# Patient Record
Sex: Female | Born: 1950 | Race: Asian | Hispanic: No | Smoking: Former smoker
Health system: Southern US, Community
[De-identification: ages and names within clinical notes are randomized; demographics above are authoritative.]

## PROBLEM LIST (undated history)

## (undated) DIAGNOSIS — R011 Cardiac murmur, unspecified: Secondary | ICD-10-CM

## (undated) DIAGNOSIS — Z8719 Personal history of other diseases of the digestive system: Secondary | ICD-10-CM

## (undated) DIAGNOSIS — K219 Gastro-esophageal reflux disease without esophagitis: Secondary | ICD-10-CM

## (undated) DIAGNOSIS — T7840XA Allergy, unspecified, initial encounter: Secondary | ICD-10-CM

## (undated) DIAGNOSIS — M1990 Unspecified osteoarthritis, unspecified site: Secondary | ICD-10-CM

## (undated) DIAGNOSIS — K279 Peptic ulcer, site unspecified, unspecified as acute or chronic, without hemorrhage or perforation: Secondary | ICD-10-CM

## (undated) DIAGNOSIS — M545 Low back pain: Secondary | ICD-10-CM

## (undated) DIAGNOSIS — H269 Unspecified cataract: Secondary | ICD-10-CM

## (undated) DIAGNOSIS — I10 Essential (primary) hypertension: Secondary | ICD-10-CM

## (undated) DIAGNOSIS — Z9221 Personal history of antineoplastic chemotherapy: Secondary | ICD-10-CM

## (undated) DIAGNOSIS — E785 Hyperlipidemia, unspecified: Secondary | ICD-10-CM

## (undated) DIAGNOSIS — C50919 Malignant neoplasm of unspecified site of unspecified female breast: Secondary | ICD-10-CM

## (undated) DIAGNOSIS — N951 Menopausal and female climacteric states: Secondary | ICD-10-CM

## (undated) DIAGNOSIS — K5730 Diverticulosis of large intestine without perforation or abscess without bleeding: Secondary | ICD-10-CM

## (undated) DIAGNOSIS — Z923 Personal history of irradiation: Secondary | ICD-10-CM

## (undated) DIAGNOSIS — Z973 Presence of spectacles and contact lenses: Secondary | ICD-10-CM

## (undated) HISTORY — DX: Essential (primary) hypertension: I10

## (undated) HISTORY — DX: Unspecified cataract: H26.9

## (undated) HISTORY — DX: Malignant neoplasm of unspecified site of unspecified female breast: C50.919

## (undated) HISTORY — DX: Low back pain: M54.5

## (undated) HISTORY — PX: CARPAL TUNNEL RELEASE: SHX101

## (undated) HISTORY — DX: Gastro-esophageal reflux disease without esophagitis: K21.9

## (undated) HISTORY — DX: Peptic ulcer, site unspecified, unspecified as acute or chronic, without hemorrhage or perforation: K27.9

## (undated) HISTORY — PX: BREAST LUMPECTOMY: SHX2

## (undated) HISTORY — PX: ABDOMINAL HYSTERECTOMY: SHX81

## (undated) HISTORY — PX: TUBAL LIGATION: SHX77

## (undated) HISTORY — PX: COLONOSCOPY: SHX174

## (undated) HISTORY — PX: BREAST SURGERY: SHX581

## (undated) HISTORY — PX: CHOLECYSTECTOMY: SHX55

## (undated) HISTORY — DX: Diverticulosis of large intestine without perforation or abscess without bleeding: K57.30

## (undated) HISTORY — DX: Hyperlipidemia, unspecified: E78.5

## (undated) HISTORY — DX: Menopausal and female climacteric states: N95.1

## (undated) HISTORY — DX: Allergy, unspecified, initial encounter: T78.40XA

## (undated) HISTORY — DX: Personal history of other diseases of the digestive system: Z87.19

---

## 1999-12-31 ENCOUNTER — Encounter: Payer: Self-pay | Admitting: Internal Medicine

## 1999-12-31 ENCOUNTER — Encounter: Admission: RE | Admit: 1999-12-31 | Discharge: 1999-12-31 | Payer: Self-pay | Admitting: Internal Medicine

## 2001-01-06 ENCOUNTER — Encounter: Payer: Self-pay | Admitting: Internal Medicine

## 2001-01-06 ENCOUNTER — Encounter: Admission: RE | Admit: 2001-01-06 | Discharge: 2001-01-06 | Payer: Self-pay | Admitting: Internal Medicine

## 2001-07-27 ENCOUNTER — Other Ambulatory Visit: Admission: RE | Admit: 2001-07-27 | Discharge: 2001-07-27 | Payer: Self-pay | Admitting: Obstetrics and Gynecology

## 2002-01-07 ENCOUNTER — Encounter: Payer: Self-pay | Admitting: Internal Medicine

## 2002-01-07 ENCOUNTER — Encounter: Admission: RE | Admit: 2002-01-07 | Discharge: 2002-01-07 | Payer: Self-pay | Admitting: Internal Medicine

## 2003-01-10 ENCOUNTER — Encounter: Payer: Self-pay | Admitting: Internal Medicine

## 2003-01-10 ENCOUNTER — Encounter: Admission: RE | Admit: 2003-01-10 | Discharge: 2003-01-10 | Payer: Self-pay | Admitting: Internal Medicine

## 2004-01-19 ENCOUNTER — Ambulatory Visit (HOSPITAL_COMMUNITY): Admission: RE | Admit: 2004-01-19 | Discharge: 2004-01-19 | Payer: Self-pay | Admitting: Internal Medicine

## 2004-01-30 ENCOUNTER — Encounter (INDEPENDENT_AMBULATORY_CARE_PROVIDER_SITE_OTHER): Payer: Self-pay | Admitting: Specialist

## 2004-01-30 ENCOUNTER — Encounter: Admission: RE | Admit: 2004-01-30 | Discharge: 2004-01-30 | Payer: Self-pay | Admitting: Internal Medicine

## 2004-02-15 LAB — HM COLONOSCOPY

## 2005-02-06 ENCOUNTER — Ambulatory Visit: Payer: Self-pay | Admitting: Internal Medicine

## 2005-02-08 ENCOUNTER — Encounter: Admission: RE | Admit: 2005-02-08 | Discharge: 2005-02-08 | Payer: Self-pay | Admitting: Internal Medicine

## 2005-02-13 ENCOUNTER — Ambulatory Visit: Payer: Self-pay | Admitting: Internal Medicine

## 2005-08-29 ENCOUNTER — Ambulatory Visit: Payer: Self-pay | Admitting: Internal Medicine

## 2005-12-11 ENCOUNTER — Ambulatory Visit: Payer: Self-pay | Admitting: Internal Medicine

## 2006-02-13 ENCOUNTER — Encounter: Admission: RE | Admit: 2006-02-13 | Discharge: 2006-02-13 | Payer: Self-pay | Admitting: Internal Medicine

## 2006-02-13 ENCOUNTER — Encounter: Payer: Self-pay | Admitting: Internal Medicine

## 2006-02-26 ENCOUNTER — Ambulatory Visit: Payer: Self-pay | Admitting: Internal Medicine

## 2006-06-02 ENCOUNTER — Ambulatory Visit: Payer: Self-pay | Admitting: Internal Medicine

## 2006-12-15 ENCOUNTER — Ambulatory Visit: Payer: Self-pay | Admitting: Internal Medicine

## 2007-01-27 ENCOUNTER — Ambulatory Visit: Payer: Self-pay | Admitting: Internal Medicine

## 2007-04-20 ENCOUNTER — Encounter: Payer: Self-pay | Admitting: Internal Medicine

## 2007-04-20 ENCOUNTER — Ambulatory Visit: Payer: Self-pay | Admitting: Internal Medicine

## 2007-04-20 DIAGNOSIS — E785 Hyperlipidemia, unspecified: Secondary | ICD-10-CM

## 2007-04-20 DIAGNOSIS — N951 Menopausal and female climacteric states: Secondary | ICD-10-CM | POA: Insufficient documentation

## 2007-04-20 DIAGNOSIS — I10 Essential (primary) hypertension: Secondary | ICD-10-CM

## 2007-04-20 DIAGNOSIS — K219 Gastro-esophageal reflux disease without esophagitis: Secondary | ICD-10-CM | POA: Insufficient documentation

## 2007-04-20 DIAGNOSIS — K5730 Diverticulosis of large intestine without perforation or abscess without bleeding: Secondary | ICD-10-CM

## 2007-04-20 HISTORY — DX: Menopausal and female climacteric states: N95.1

## 2007-04-20 HISTORY — DX: Gastro-esophageal reflux disease without esophagitis: K21.9

## 2007-04-20 HISTORY — DX: Diverticulosis of large intestine without perforation or abscess without bleeding: K57.30

## 2007-04-20 HISTORY — DX: Essential (primary) hypertension: I10

## 2007-04-20 HISTORY — DX: Hyperlipidemia, unspecified: E78.5

## 2007-05-26 ENCOUNTER — Ambulatory Visit: Payer: Self-pay | Admitting: Internal Medicine

## 2007-05-26 LAB — CONVERTED CEMR LAB
Basophils Absolute: 0 10*3/uL (ref 0.0–0.1)
CO2: 32 meq/L (ref 19–32)
Cholesterol: 208 mg/dL (ref 0–200)
Creatinine, Ser: 1 mg/dL (ref 0.4–1.2)
Direct LDL: 119.3 mg/dL
GFR calc Af Amer: 74 mL/min
HCT: 40.8 % (ref 36.0–46.0)
Hemoglobin: 14.3 g/dL (ref 12.0–15.0)
Lymphocytes Relative: 26.1 % (ref 12.0–46.0)
MCHC: 35.2 g/dL (ref 30.0–36.0)
Monocytes Absolute: 0.5 10*3/uL (ref 0.2–0.7)
Neutro Abs: 5 10*3/uL (ref 1.4–7.7)
Neutrophils Relative %: 63.9 % (ref 43.0–77.0)
Potassium: 4.2 meq/L (ref 3.5–5.1)
RDW: 11.8 % (ref 11.5–14.6)
Sodium: 143 meq/L (ref 135–145)
TSH: 1.98 microintl units/mL (ref 0.35–5.50)
Total Bilirubin: 0.7 mg/dL (ref 0.3–1.2)
Total CHOL/HDL Ratio: 3.6
Total Protein: 6.8 g/dL (ref 6.0–8.3)

## 2007-07-15 ENCOUNTER — Ambulatory Visit: Payer: Self-pay | Admitting: Internal Medicine

## 2007-08-21 ENCOUNTER — Ambulatory Visit: Payer: Self-pay | Admitting: Internal Medicine

## 2007-09-07 ENCOUNTER — Encounter: Payer: Self-pay | Admitting: Internal Medicine

## 2008-01-19 ENCOUNTER — Ambulatory Visit: Payer: Self-pay | Admitting: Internal Medicine

## 2008-02-08 ENCOUNTER — Ambulatory Visit: Payer: Self-pay | Admitting: Internal Medicine

## 2008-02-08 DIAGNOSIS — J069 Acute upper respiratory infection, unspecified: Secondary | ICD-10-CM | POA: Insufficient documentation

## 2008-05-09 ENCOUNTER — Telehealth: Payer: Self-pay | Admitting: Internal Medicine

## 2008-05-10 ENCOUNTER — Ambulatory Visit: Payer: Self-pay | Admitting: Internal Medicine

## 2008-05-10 DIAGNOSIS — R51 Headache: Secondary | ICD-10-CM | POA: Insufficient documentation

## 2008-05-10 DIAGNOSIS — R519 Headache, unspecified: Secondary | ICD-10-CM | POA: Insufficient documentation

## 2008-05-24 ENCOUNTER — Ambulatory Visit: Payer: Self-pay | Admitting: Internal Medicine

## 2008-05-24 DIAGNOSIS — S335XXA Sprain of ligaments of lumbar spine, initial encounter: Secondary | ICD-10-CM | POA: Insufficient documentation

## 2008-06-15 ENCOUNTER — Telehealth: Payer: Self-pay | Admitting: Internal Medicine

## 2008-06-16 ENCOUNTER — Encounter: Payer: Self-pay | Admitting: Internal Medicine

## 2008-06-29 ENCOUNTER — Ambulatory Visit: Payer: Self-pay | Admitting: Internal Medicine

## 2008-06-29 LAB — CONVERTED CEMR LAB
AST: 29 units/L (ref 0–37)
Albumin: 3.6 g/dL (ref 3.5–5.2)
BUN: 13 mg/dL (ref 6–23)
Basophils Relative: 1 % (ref 0.0–3.0)
Chloride: 109 meq/L (ref 96–112)
Creatinine, Ser: 0.8 mg/dL (ref 0.4–1.2)
Direct LDL: 102.4 mg/dL
Eosinophils Absolute: 0.2 10*3/uL (ref 0.0–0.7)
Eosinophils Relative: 3.2 % (ref 0.0–5.0)
GFR calc non Af Amer: 79 mL/min
HCT: 41.1 % (ref 36.0–46.0)
Hemoglobin: 14.6 g/dL (ref 12.0–15.0)
MCV: 88.6 fL (ref 78.0–100.0)
Monocytes Absolute: 0.4 10*3/uL (ref 0.1–1.0)
Neutrophils Relative %: 57 % (ref 43.0–77.0)
Nitrite: NEGATIVE
RBC: 4.64 M/uL (ref 3.87–5.11)
Specific Gravity, Urine: 1.01
WBC Urine, dipstick: NEGATIVE
WBC: 6.1 10*3/uL (ref 4.5–10.5)

## 2008-08-16 ENCOUNTER — Ambulatory Visit: Payer: Self-pay | Admitting: Internal Medicine

## 2008-08-16 DIAGNOSIS — M545 Low back pain, unspecified: Secondary | ICD-10-CM

## 2008-08-16 HISTORY — DX: Low back pain, unspecified: M54.50

## 2008-12-19 ENCOUNTER — Ambulatory Visit: Payer: Self-pay | Admitting: Internal Medicine

## 2008-12-19 DIAGNOSIS — M6740 Ganglion, unspecified site: Secondary | ICD-10-CM | POA: Insufficient documentation

## 2009-01-02 ENCOUNTER — Encounter: Admission: RE | Admit: 2009-01-02 | Discharge: 2009-01-02 | Payer: Self-pay | Admitting: Internal Medicine

## 2009-03-07 ENCOUNTER — Ambulatory Visit: Payer: Self-pay | Admitting: Internal Medicine

## 2009-07-18 ENCOUNTER — Ambulatory Visit: Payer: Self-pay | Admitting: Internal Medicine

## 2009-07-24 ENCOUNTER — Ambulatory Visit: Payer: Self-pay | Admitting: Internal Medicine

## 2009-08-25 ENCOUNTER — Ambulatory Visit: Payer: Self-pay | Admitting: Internal Medicine

## 2009-08-25 LAB — CONVERTED CEMR LAB
ALT: 45 units/L — ABNORMAL HIGH (ref 0–35)
Albumin: 3.8 g/dL (ref 3.5–5.2)
Alkaline Phosphatase: 80 units/L (ref 39–117)
Basophils Relative: 0.4 % (ref 0.0–3.0)
Bilirubin Urine: NEGATIVE
CO2: 33 meq/L — ABNORMAL HIGH (ref 19–32)
Chloride: 102 meq/L (ref 96–112)
Cholesterol: 185 mg/dL (ref 0–200)
Direct LDL: 107.5 mg/dL
Eosinophils Absolute: 0.2 10*3/uL (ref 0.0–0.7)
Eosinophils Relative: 3.2 % (ref 0.0–5.0)
Hemoglobin: 14.5 g/dL (ref 12.0–15.0)
MCHC: 33.8 g/dL (ref 30.0–36.0)
MCV: 90.6 fL (ref 78.0–100.0)
Monocytes Absolute: 0.5 10*3/uL (ref 0.1–1.0)
Neutro Abs: 3.5 10*3/uL (ref 1.4–7.7)
Nitrite: NEGATIVE
Potassium: 3.9 meq/L (ref 3.5–5.1)
RBC: 4.72 M/uL (ref 3.87–5.11)
Sodium: 146 meq/L — ABNORMAL HIGH (ref 135–145)
Total CHOL/HDL Ratio: 4
Total Protein: 7.2 g/dL (ref 6.0–8.3)
Urobilinogen, UA: 0.2
WBC Urine, dipstick: NEGATIVE

## 2009-09-04 ENCOUNTER — Ambulatory Visit: Payer: Self-pay | Admitting: Internal Medicine

## 2009-12-21 ENCOUNTER — Ambulatory Visit: Payer: Self-pay | Admitting: Internal Medicine

## 2009-12-21 DIAGNOSIS — R109 Unspecified abdominal pain: Secondary | ICD-10-CM | POA: Insufficient documentation

## 2009-12-21 DIAGNOSIS — R350 Frequency of micturition: Secondary | ICD-10-CM | POA: Insufficient documentation

## 2009-12-21 DIAGNOSIS — Z8719 Personal history of other diseases of the digestive system: Secondary | ICD-10-CM

## 2009-12-21 HISTORY — DX: Personal history of other diseases of the digestive system: Z87.19

## 2009-12-21 LAB — CONVERTED CEMR LAB
Glucose, Urine, Semiquant: NEGATIVE
Nitrite: NEGATIVE
Protein, U semiquant: NEGATIVE
WBC Urine, dipstick: NEGATIVE

## 2009-12-22 ENCOUNTER — Telehealth: Payer: Self-pay | Admitting: Internal Medicine

## 2009-12-26 ENCOUNTER — Encounter: Admission: RE | Admit: 2009-12-26 | Discharge: 2009-12-26 | Payer: Self-pay | Admitting: Internal Medicine

## 2009-12-27 ENCOUNTER — Telehealth: Payer: Self-pay | Admitting: Internal Medicine

## 2009-12-27 LAB — CONVERTED CEMR LAB
AST: 38 units/L — ABNORMAL HIGH (ref 0–37)
Albumin: 3.6 g/dL (ref 3.5–5.2)
Total Protein: 6.8 g/dL (ref 6.0–8.3)

## 2010-01-03 ENCOUNTER — Encounter: Admission: RE | Admit: 2010-01-03 | Discharge: 2010-01-03 | Payer: Self-pay | Admitting: Internal Medicine

## 2010-03-05 ENCOUNTER — Ambulatory Visit: Payer: Self-pay | Admitting: Internal Medicine

## 2010-04-02 ENCOUNTER — Telehealth: Payer: Self-pay | Admitting: Internal Medicine

## 2010-07-30 ENCOUNTER — Telehealth: Payer: Self-pay | Admitting: Internal Medicine

## 2010-08-30 ENCOUNTER — Ambulatory Visit: Payer: Self-pay | Admitting: Internal Medicine

## 2010-08-30 LAB — CONVERTED CEMR LAB
ALT: 25 units/L (ref 0–35)
AST: 29 units/L (ref 0–37)
Albumin: 3.6 g/dL (ref 3.5–5.2)
BUN: 18 mg/dL (ref 6–23)
Basophils Absolute: 0 10*3/uL (ref 0.0–0.1)
Blood in Urine, dipstick: NEGATIVE
CO2: 29 meq/L (ref 19–32)
Chloride: 104 meq/L (ref 96–112)
Direct LDL: 101 mg/dL
Glucose, Bld: 88 mg/dL (ref 70–99)
Glucose, Urine, Semiquant: NEGATIVE
HCT: 42 % (ref 36.0–46.0)
HDL: 50.6 mg/dL (ref >39.00)
Hemoglobin: 14.3 g/dL (ref 12.0–15.0)
Lymphs Abs: 2.5 10*3/uL (ref 0.7–4.0)
MCV: 90 fL (ref 78.0–100.0)
Monocytes Absolute: 0.4 10*3/uL (ref 0.1–1.0)
Monocytes Relative: 5.9 % (ref 3.0–12.0)
Neutro Abs: 4.3 10*3/uL (ref 1.4–7.7)
Nitrite: NEGATIVE
Platelets: 217 10*3/uL (ref 150.0–400.0)
Potassium: 4 meq/L (ref 3.5–5.1)
RDW: 12.3 % (ref 11.5–14.6)
Sodium: 142 meq/L (ref 135–145)
Specific Gravity, Urine: 1.015
TSH: 1.71 microintl units/mL (ref 0.35–5.50)
Total Bilirubin: 0.8 mg/dL (ref 0.3–1.2)
WBC Urine, dipstick: NEGATIVE
pH: 6.5

## 2010-09-06 ENCOUNTER — Ambulatory Visit: Payer: Self-pay | Admitting: Internal Medicine

## 2010-09-06 DIAGNOSIS — K279 Peptic ulcer, site unspecified, unspecified as acute or chronic, without hemorrhage or perforation: Secondary | ICD-10-CM

## 2010-09-06 HISTORY — DX: Peptic ulcer, site unspecified, unspecified as acute or chronic, without hemorrhage or perforation: K27.9

## 2010-09-10 ENCOUNTER — Telehealth: Payer: Self-pay | Admitting: Internal Medicine

## 2010-11-22 NOTE — Progress Notes (Signed)
Summary: refill alprazolam  Phone Note Refill Request Message from:  Fax from Pharmacy on July 30, 2010 4:44 PM  Refills Requested: Medication #1:  ALPRAZOLAM 0.25 MG TABS Take 1 tablet by mouth once a day [facility]   Method Requested: Fax to Local Pharmacy Initial call taken by: Duard Brady LPN,  July 30, 2010 4:45 PM    Prescriptions: ALPRAZOLAM 0.25 MG TABS (ALPRAZOLAM) Take 1 tablet by mouth once a day  #90 x 2   Entered by:   Duard Brady LPN   Authorized by:   Gordy Savers  MD   Signed by:   Duard Brady LPN on 04/54/0981   Method used:   Historical   RxID:   [id number]  [facility]

## 2010-11-22 NOTE — Progress Notes (Signed)
Summary: dysuria  Phone Note Call from Patient   Caller: Patient Call For: Katie Savers  MD Summary of Call: Pt. is still having dysuria, frequency and strong odor to her Urine.  Is drinking all the liquids she can tolerate. [phone] Initial call taken by: Lynann Beaver CMA,  December 27, 2009 9:25 AM  Follow-up for Phone Call        generic cipro 500 #14 one BID Follow-up by: Katie Savers  MD,  December 27, 2009 12:30 PM  Additional Follow-up for Phone Call Additional follow up Details #1::        [facility]  Med called in and ptient aware. Additional Follow-up by: Rudy Jew, RN,  December 27, 2009 1:20 PM    New/Updated Medications: CIPROFLOXACIN HCL 500 MG TABS (CIPROFLOXACIN HCL) One bid Prescriptions: CIPROFLOXACIN HCL 500 MG TABS (CIPROFLOXACIN HCL) One bid  #14 x 0   Entered by:   Rudy Jew, RN   Authorized by:   Katie Savers  MD   Signed by:   Rudy Jew, RN on 12/27/2009   Method used:   Telephoned to ...         RxID:   [id number]

## 2010-11-22 NOTE — Assessment & Plan Note (Signed)
Summary: ? bladder inf/pt req ua/cjr   Vital Signs:  Patient profile:   60 year old female Weight:      173 pounds Temp:     97.3 degrees F oral BP sitting:   120 / 80  (left arm) Cuff size:   regular  Vitals Entered By: Duard Brady LPN (December 21, 1608 8:20 AM) CC: c/o ?UTI - low abd pain ,freq urination Is Patient Diabetic? No   CC:  c/o ?UTI - low abd pain  and freq urination.  History of Present Illness: 28 -year-old patient who is seen today for concerns about a possible bladder infection.  She has noticed an increase in frequency, occasional discomfort and achiness in the lower abdominal area.  No fever or frank dysuria.  Denies any fever or flank pain the patient has dyslipidemia, on Lipitor.  Transaminases were slightly elevated last visit.  She is a nondrinker. She has treated hypertension.  Preventive Screening-Counseling & Management  Alcohol-Tobacco     Smoking Status: never  Allergies: 1)  Sulfamethoxazole (Sulfamethoxazole) 2)  Amoxicillin (Amoxicillin)  Past History:  Past Medical History: Reviewed history from 08/16/2008 and no changes required. GERD Hyperlipidemia Menopausal syndrome Hypertension Diverticulosis, colon Low back pain  Family History: Reviewed history from 08/16/2008 and no changes required. father 46 of pneumonia.  History melanoma mother history of hypertension, hypercholesterolemia, and brother.   One sister positive for hypertension maternal grandmother had colon cancer one half brother with hypertension  Review of Systems  The patient denies anorexia, fever, weight loss, weight gain, vision loss, decreased hearing, hoarseness, chest pain, syncope, dyspnea on exertion, peripheral edema, prolonged cough, headaches, hemoptysis, abdominal pain, melena, hematochezia, severe indigestion/heartburn, hematuria, incontinence, genital sores, muscle weakness, suspicious skin lesions, transient blindness, difficulty walking,  depression, unusual weight change, abnormal bleeding, enlarged lymph nodes, angioedema, and breast masses.    Physical Exam  General:  slightly overweight.  Normal blood pressure Head:  Normocephalic and atraumatic without obvious abnormalities. No apparent alopecia or balding. Eyes:  No corneal or conjunctival inflammation noted. EOMI. Perrla. Funduscopic exam benign, without hemorrhages, exudates or papilledema. Vision grossly normal. Mouth:  Oral mucosa and oropharynx without lesions or exudates.  Teeth in good repair. Neck:  No deformities, masses, or tenderness noted. Lungs:  Normal respiratory effort, chest expands symmetrically. Lungs are clear to auscultation, no crackles or wheezes. Heart:  Normal rate and regular rhythm. S1 and S2 normal without gallop, murmur, click, rub or other extra sounds. Abdomen:  liver is palpable on inspiration Msk:  No deformity or scoliosis noted of thoracic or lumbar spine.   Pulses:  R and L carotid,radial,femoral,dorsalis pedis and posterior tibial pulses are full and equal bilaterally Extremities:  No clubbing, cyanosis, edema, or deformity noted with normal full range of motion of all joints.     Impression & Recommendations:  Problem # 1:  ABDOMINAL PAIN, LOWER (ICD-789.09)  Her updated medication list for this problem includes:    Darvocet-n 100 100-650 Mg Tabs (Propoxyphene n-apap) .Marland Kitchen... 1 qid as needed  Orders: UA Dipstick w/o Micro (manual) (96045)  Problem # 2:  HEPATOMEGALY, HX OF (ICD-V12.79)  Orders: Venipuncture (40981) TLB-Hepatic/Liver Function Pnl (80076-HEPATIC) Radiology Referral (Radiology)  Problem # 3:  HYPERTENSION (ICD-401.9)  Her updated medication list for this problem includes:    Hydrochlorothiazide 25 Mg Tabs (Hydrochlorothiazide) .Marland Kitchen... Take 1 tablet by mouth once a day  Complete Medication List: 1)  Alprazolam 0.25 Mg Tabs (Alprazolam) .... Take 1 tablet by mouth once a  day 2)  Hydrochlorothiazide 25 Mg Tabs  (Hydrochlorothiazide) .... Take 1 tablet by mouth once a day 3)  Lipitor 40 Mg Tabs (Atorvastatin calcium) .Marland Kitchen.. 1 once daily 4)  Nexium 40 Mg Cpdr (Esomeprazole magnesium) .... Take 1 capsule by mouth once a day 5)  Premarin 0.625 Mg Tabs (Estrogens conjugated) .... Take 1 tablet by mouth once a day 6)  Darvocet-n 100 100-650 Mg Tabs (Propoxyphene n-apap) .Marland Kitchen.. 1 qid as needed 7)  Allegra-d 12 Hour 60-120 Mg Xr12h-tab (Fexofenadine-pseudoephedrine) .Marland Kitchen.. 1 two times a day  Patient Instructions: 1)  Please schedule a follow-up appointment in 4 months. 2)  Limit your Sodium (Salt). 3)  It is important that you exercise regularly at least 20 minutes 5 times a week. If you develop chest pain, have severe difficulty breathing, or feel very tired , stop exercising immediately and seek medical attention.  Laboratory Results   Urine Tests  Date/Time Received: December 21, 2009 9:46 AM  Date/Time Reported: December 21, 2009 9:46 AM   Routine Urinalysis   Color: yellow Appearance: Clear Glucose: negative   (Normal Range: Negative) Bilirubin: negative   (Normal Range: Negative) Ketone: negative   (Normal Range: Negative) Spec. Gravity: 1.015   (Normal Range: 1.003-1.035) Blood: trace-lysed   (Normal Range: Negative) pH: 7.0   (Normal Range: 5.0-8.0) Protein: negative   (Normal Range: Negative) Urobilinogen: 0.2   (Normal Range: 0-1) Nitrite: negative   (Normal Range: Negative) Leukocyte Esterace: negative   (Normal Range: Negative)

## 2010-11-22 NOTE — Assessment & Plan Note (Signed)
Summary: cpx/no pap/njr   Vital Signs:  Patient profile:   60 year old female Height:      63 inches Weight:      166 pounds BMI:     29.51 Temp:     98.0 degrees F oral BP sitting:   110 / 72  (left arm) Cuff size:   regular  Vitals Entered By: Cay Schillings LPN (September 06, 3418 8:56 AM) CC: cpx - doing well Is Patient Diabetic? No   CC:  cpx - doing well.  History of Present Illness: 60 year old patient who is seen today for a wellness exam.  Medical problems include hypertension, gastroesophageal reflux disease, and a history of peptic ulcer disease and treated dyslipidemia, and has done quite well on Lipitor 40 mg daily.  She does receive annual gynecologic care.  She has chronic low back pain, which has been stable  Allergies: 1)  Sulfamethoxazole (Sulfamethoxazole) 2)  Amoxicillin (Amoxicillin)  Past History:  Past Medical History: GERD Hyperlipidemia Menopausal syndrome Hypertension Diverticulosis, colon Low back pain Peptic ulcer disease- early 16s  Past Surgical History: Reviewed history from 08/16/2008 and no changes required. Cholecystectomy Hysterectomy 1986 Tubal ligation 1979 gravida two, para two, abortus zero  colonoscopy April 2005  Family History: Reviewed history from 08/16/2008 and no changes required. father 69 of pneumonia.  History melanoma mother history of hypertension, hypercholesterolemia, and brother.   One sister positive for hypertension maternal grandmother had colon cancer one half brother with hypertension  Social History: Reviewed history from 08/16/2008 and no changes required. recent death of husband due to glioblastoma multiforme Never Smoked works 30 hours per week  Review of Systems  The patient denies anorexia, fever, weight loss, weight gain, vision loss, decreased hearing, hoarseness, chest pain, syncope, dyspnea on exertion, peripheral edema, prolonged cough, headaches, hemoptysis, abdominal pain, melena,  hematochezia, severe indigestion/heartburn, hematuria, incontinence, genital sores, muscle weakness, suspicious skin lesions, transient blindness, difficulty walking, depression, unusual weight change, abnormal bleeding, enlarged lymph nodes, angioedema, and breast masses.    Physical Exam  General:  Well-developed,well-nourished,in no acute distress; alert,appropriate and cooperative throughout examination Head:  Normocephalic and atraumatic without obvious abnormalities. No apparent alopecia or balding. Eyes:  No corneal or conjunctival inflammation noted. EOMI. Perrla. Funduscopic exam benign, without hemorrhages, exudates or papilledema. Vision grossly normal. Ears:  External ear exam shows no significant lesions or deformities.  Otoscopic examination reveals clear canals, tympanic membranes are intact bilaterally without bulging, retraction, inflammation or discharge. Hearing is grossly normal bilaterally. Nose:  External nasal examination shows no deformity or inflammation. Nasal mucosa are pink and moist without lesions or exudates. Mouth:  Oral mucosa and oropharynx without lesions or exudates.  Teeth in good repair. Neck:  No deformities, masses, or tenderness noted. Chest Wall:  No deformities, masses, or tenderness noted. Breasts:  No mass, nodules, thickening, tenderness, bulging, retraction, inflamation, nipple discharge or skin changes noted.   Lungs:  Normal respiratory effort, chest expands symmetrically. Lungs are clear to auscultation, no crackles or wheezes. Heart:  Normal rate and regular rhythm. S1 and S2 normal without gallop, murmur, click, rub or other extra sounds. Abdomen:  Bowel sounds positive,abdomen soft and non-tender without masses, organomegaly or hernias noted. Msk:  No deformity or scoliosis noted of thoracic or lumbar spine.   Pulses:  R and L carotid,radial,femoral,dorsalis pedis and posterior tibial pulses are full and equal bilaterally Extremities:  No  clubbing, cyanosis, edema, or deformity noted with normal full range of motion of all joints.  Neurologic:  No cranial nerve deficits noted. Station and gait are normal. Plantar reflexes are down-going bilaterally. DTRs are symmetrical throughout. Sensory, motor and coordinative functions appear intact. Skin:  Intact without suspicious lesions or rashes Cervical Nodes:  No lymphadenopathy noted Axillary Nodes:  No palpable lymphadenopathy Inguinal Nodes:  No significant adenopathy Psych:  Cognition and judgment appear intact. Alert and cooperative with normal attention span and concentration. No apparent delusions, illusions, hallucinations   Impression & Recommendations:  Problem # 1:  Preventive Health Care (ICD-V70.0)  Complete Medication List: 1)  Alprazolam 0.25 Mg Tabs (Alprazolam) .... Take 1 tablet by mouth once a day 2)  Hydrochlorothiazide 25 Mg Tabs (Hydrochlorothiazide) .... Take 1 tablet by mouth once a day 3)  Lipitor 40 Mg Tabs (Atorvastatin calcium) .Marland Kitchen.. 1 once daily 4)  Nexium 40 Mg Cpdr (Esomeprazole magnesium) .... Take 1 capsule by mouth once a day 5)  Premarin 0.625 Mg Tabs (Estrogens conjugated) .... Take 1 tablet by mouth once a day 6)  Allegra-d 12 Hour 60-120 Mg Xr12h-tab (Fexofenadine-pseudoephedrine) .Marland Kitchen.. 1 two times a day  Patient Instructions: 1)  Please schedule a follow-up appointment in 6 months. 2)  Avoid foods high in acid (tomatoes, citrus juices, spicy foods). Avoid eating within two hours of lying down or before exercising. Do not over eat; try smaller more frequent meals. Elevate head of bed twelve inches when sleeping. 3)  It is important that you exercise regularly at least 20 minutes 5 times a week. If you develop chest pain, have severe difficulty breathing, or feel very tired , stop exercising immediately and seek medical attention. 4)  Take calcium +Vitamin D daily. 5)  Check your Blood Pressure regularly. If it is above: 150/90 you should make  an appointment. Prescriptions: ALLEGRA-D 12 HOUR 60-120 MG XR12H-TAB (FEXOFENADINE-PSEUDOEPHEDRINE) 1 two times a day  #90 x 4   Entered and Authorized by:   Marletta Lor  MD   Signed by:   Marletta Lor  MD on 09/06/2010   Method used:   [facility]        [facility]  [address] [place] [facility]       [place], [place]  [address]       Ph: [phone] or [phone]       Fax: [phone]   RxID:   [id number] [place] 40 MG CPDR (ESOMEPRAZOLE MAGNESIUM) Take 1 capsule by mouth once a day  #90 x 4   Entered and Authorized by:   Marletta Lor  MD   Signed by:   Marletta Lor  MD on 09/06/2010   Method used:   [facility]ically to        [facility]  [address] [place] [facility]       [place], [place]  [address]       Ph: [phone] or [phone]       Fax: [phone]   RxID:   [id number] LIPITOR 40 MG TABS (ATORVASTATIN CALCIUM) 1 once daily  #90 x 4   Entered and Authorized by:   Marletta Lor  MD   Signed by:   Marletta Lor  MD on 09/06/2010   Method used:   Electronically to        [facility]  [address] [place] [facility]       [address]       [place], [place]  [address]  Ph: [phone] or [phone]       Fax: [phone]   RxID:   [id number] HYDROCHLOROTHIAZIDE 25 MG TABS (HYDROCHLOROTHIAZIDE) Take 1 tablet by mouth once a day  #90 x 4   Entered and Authorized by:   Marletta Lor  MD   Signed by:   Marletta Lor  MD on 09/06/2010   Method used:   [facility]ically to        [facility] [facility]       [address]       [place], [place]  [address]       Ph: [phone] or [phone]       Fax: [phone]   RxID:   [id number]    Orders Added: 1)  Est. Patient 40-64 years [[address]]   Immunization History:  Tetanus/Td Immunization History:    Tetanus/Td:  Td (02/08/2008)  Influenza Immunization History:    Influenza:   Historical (08/10/2010)   Immunization History:  Influenza Immunization History:    Influenza:  Historical (08/10/2010) done at work per pt. Orpha Bur

## 2010-11-22 NOTE — Progress Notes (Signed)
Summary: refill allegra  Phone Note Refill Request Message from:  Fax from Pharmacy on April 02, 2010 11:33 AM  Refills Requested: Medication #1:  ALLEGRA-D 12 HOUR 60-120 MG XR12H-TAB 1 two times a day. [facility]      Method Requested: Fax to Local Pharmacy Initial call taken by: Duard Brady LPN,  April 02, 2010 11:34 AM    Prescriptions: ALLEGRA-D 12 HOUR 60-120 MG XR12H-TAB (FEXOFENADINE-PSEUDOEPHEDRINE) 1 two times a day  #90 x 4   Entered by:   Duard Brady LPN   Authorized by:   Gordy Savers  MD   Signed by:   Duard Brady LPN on 04/54/0981   Method used:   Faxed to ...       [facility] [address] ([facility])       [address] [place], [place]  [address]       Ph: [phone]       Fax: [phone]   RxID:   [id number]

## 2010-11-22 NOTE — Progress Notes (Signed)
Summary: difficulty urination  Phone Note Call from Patient   Caller: Patient Call For: Gordy Savers  MD Summary of Call: Pt is calling to let Dr. Kirtland Bouchard know that she is having extreme difficulty passing Urine today.  Called pt back and got answering machine.  [phone] Initial call taken by: Lynann Beaver CMA,  December 22, 2009 1:52 PM  Follow-up for Phone Call        Pt calls back and states she is having more urge to urinate with no urine today.  Is still able to pass Urine. Follow-up by: Lynann Beaver CMA,  December 22, 2009 1:55 PM  Additional Follow-up for Phone Call Additional follow up Details #1::        force fluids; avoid caffeine call if worsens to consider an overactive bladder.  Medication Additional Follow-up by: Gordy Savers  MD,  December 22, 2009 2:43 PM    Additional Follow-up for Phone Call Additional follow up Details #2::    Pt. notified. Follow-up by: Lynann Beaver CMA,  December 22, 2009 2:52 PM

## 2010-11-22 NOTE — Progress Notes (Signed)
Summary: alternative needed  Phone Note From Pharmacy Call back at [phone]   [facility] [facility] Reason for Call: Patient requests substitution Summary of Call: allegra D 12h. not available from manufacturer. please substitute. Initial call taken by: Warnell Forester,  September 10, 2010 12:45 PM  Follow-up for Phone Call        sudafed and zyrtec Follow-up by: Gordy Savers  MD,  September 11, 2010 12:44 PM  Additional Follow-up for Phone Call Additional follow up Details #1::        called pharmacy - LMTCB of questions - can use sudfed and zyrtec otc -KIK Additional Follow-up by: Duard Brady LPN,  September 11, 2010 2:22 PM

## 2010-11-22 NOTE — Assessment & Plan Note (Signed)
Summary: 6 month rov/njr/PT RESCD//CCM   Vital Signs:  Patient profile:   60 year old female Weight:      169 pounds Temp:     98.2 degrees F oral BP sitting:   100 / 70  (left arm) Cuff size:   regular  Vitals Entered By: Duard Brady LPN (Mar 05, 2010 10:47 AM) CC: 6 mos rov - doing well Is Patient Diabetic? No   CC:  6 mos rov - doing well.  History of Present Illness: 60 year old patient who is seen today for follow-up of her dyslipidemia, gastroesophageal reflux disease, and mild hypertension.  She is done quite well.  No concerns or complaints.  She continues to tolerate her Lipitor without difficulty.  Nexium controls her dyspepsia quite well.  Preventive Screening-Counseling & Management  Alcohol-Tobacco     Smoking Status: quit  Allergies: 1)  Sulfamethoxazole (Sulfamethoxazole) 2)  Amoxicillin (Amoxicillin)  Past History:  Past Medical History: Reviewed history from 08/16/2008 and no changes required. GERD Hyperlipidemia Menopausal syndrome Hypertension Diverticulosis, colon Low back pain  Past Surgical History: Reviewed history from 08/16/2008 and no changes required. Cholecystectomy Hysterectomy 1986 Tubal ligation 1979 gravida two, para two, abortus zero  colonoscopy April 2005  Social History: Smoking Status:  quit  Review of Systems  The patient denies anorexia, fever, weight loss, weight gain, vision loss, decreased hearing, hoarseness, chest pain, syncope, dyspnea on exertion, peripheral edema, prolonged cough, headaches, hemoptysis, abdominal pain, melena, hematochezia, severe indigestion/heartburn, hematuria, incontinence, genital sores, muscle weakness, suspicious skin lesions, transient blindness, difficulty walking, depression, unusual weight change, abnormal bleeding, enlarged lymph nodes, angioedema, and breast masses.    Physical Exam  General:  110/70 Head:  Normocephalic and atraumatic without obvious abnormalities. No  apparent alopecia or balding. Eyes:  No corneal or conjunctival inflammation noted. EOMI. Perrla. Funduscopic exam benign, without hemorrhages, exudates or papilledema. Vision grossly normal. Mouth:  Oral mucosa and oropharynx without lesions or exudates.  Teeth in good repair. Neck:  No deformities, masses, or tenderness noted. Lungs:  Normal respiratory effort, chest expands symmetrically. Lungs are clear to auscultation, no crackles or wheezes. Heart:  Normal rate and regular rhythm. S1 and S2 normal without gallop, murmur, click, rub or other extra sounds. Abdomen:  Bowel sounds positive,abdomen soft and non-tender without masses, organomegaly or hernias noted.   Impression & Recommendations:  Problem # 1:  HYPERTENSION (ICD-401.9)  Her updated medication list for this problem includes:    Hydrochlorothiazide 25 Mg Tabs (Hydrochlorothiazide) .Marland Kitchen... Take 1 tablet by mouth once a day  Her updated medication list for this problem includes:    Hydrochlorothiazide 25 Mg Tabs (Hydrochlorothiazide) .Marland Kitchen... Take 1 tablet by mouth once a day  Problem # 2:  GERD (ICD-530.81)  Her updated medication list for this problem includes:    Nexium 40 Mg Cpdr (Esomeprazole magnesium) .Marland Kitchen... Take 1 capsule by mouth once a day  Her updated medication list for this problem includes:    Nexium 40 Mg Cpdr (Esomeprazole magnesium) .Marland Kitchen... Take 1 capsule by mouth once a day  Problem # 3:  HYPERLIPIDEMIA (ICD-272.4)  Her updated medication list for this problem includes:    Lipitor 40 Mg Tabs (Atorvastatin calcium) .Marland Kitchen... 1 once daily  Her updated medication list for this problem includes:    Lipitor 40 Mg Tabs (Atorvastatin calcium) .Marland Kitchen... 1 once daily  Complete Medication List: 1)  Alprazolam 0.25 Mg Tabs (Alprazolam) .... Take 1 tablet by mouth once a day 2)  Hydrochlorothiazide 25 Mg Tabs (Hydrochlorothiazide) .Marland KitchenMarland KitchenMarland Kitchen  Take 1 tablet by mouth once a day 3)  Lipitor 40 Mg Tabs (Atorvastatin calcium) .Marland Kitchen.. 1 once  daily 4)  Nexium 40 Mg Cpdr (Esomeprazole magnesium) .... Take 1 capsule by mouth once a day 5)  Premarin 0.625 Mg Tabs (Estrogens conjugated) .... Take 1 tablet by mouth once a day 6)  Allegra-d 12 Hour 60-120 Mg Xr12h-tab (Fexofenadine-pseudoephedrine) .Marland Kitchen.. 1 two times a day  Patient Instructions: 1)  Please schedule a follow-up appointment in 6 months. 2)  Limit your Sodium (Salt) to less than 2 grams a day(slightly less than 1/2 a teaspoon) to prevent fluid retention, swelling, or worsening of symptoms. 3)  It is important that you exercise regularly at least 20 minutes 5 times a week. If you develop chest pain, have severe difficulty breathing, or feel very tired , stop exercising immediately and seek medical attention. 4)  You need to lose weight. Consider a lower calorie diet and regular exercise.  5)  Check your Blood Pressure regularly. If it is above:  150/90 you should make an appointment. Prescriptions: ALLEGRA-D 12 HOUR 60-120 MG XR12H-TAB (FEXOFENADINE-PSEUDOEPHEDRINE) 1 two times a day  #90 x 4   Entered and Authorized by:   Gordy Savers  MD   Signed by:   Gordy Savers  MD on 03/05/2010   Method used:   Electronically to        [facility] [id number] (retail)       [address]       [place], [place]  [address]       Ph: [phone] or [phone]       Fax: [phone]   RxID:   [id number] PREMARIN 0.625 MG TABS (ESTROGENS CONJUGATED) Take 1 tablet by mouth once a day  #90 x 6   Entered and Authorized by:   Gordy Savers  MD   Signed by:   Gordy Savers  MD on 03/05/2010   Method used:   [facility]        [facility] (retail)       [address]       [place], [place]  [address]       Ph: [phone] or [phone]       Fax: [phone]   RxID:   [id number] NEXIUM 40 MG CPDR (ESOMEPRAZOLE MAGNESIUM) Take 1 capsule by mouth once a day  #90 x 6   Entered and Authorized by:   Gordy Savers   MD   Signed by:   Gordy Savers  MD on 03/05/2010   Method used:   [facility]        [facility] (retail)       [address]       [place], [place]  [address]       Ph: [phone] or [phone]       Fax: [phone]   RxID:   [id number] LIPITOR 40 MG TABS (ATORVASTATIN CALCIUM) 1 once daily  #90 x 6   Entered and Authorized by:   Gordy Savers  MD   Signed by:   Gordy Savers  MD on 03/05/2010   Method used:   [facility]        [facility] (retail)       [address]       [place], [place]  [address]       Ph: [phone] or [phone]  Fax: [phone]   RxID:   [id number] HYDROCHLOROTHIAZIDE 25 MG TABS (HYDROCHLOROTHIAZIDE) Take 1 tablet by mouth once a day  #90 x 6   Entered and Authorized by:   Gordy Savers  MD   Signed by:   Gordy Savers  MD on 03/05/2010   Method used:   [facility]        [facility] (retail)       [address]       [place], [place]  [address]       Ph: [phone] or [phone]       Fax: [phone]   RxID:   [id number]

## 2010-12-31 ENCOUNTER — Other Ambulatory Visit: Payer: Self-pay | Admitting: Internal Medicine

## 2010-12-31 DIAGNOSIS — Z1231 Encounter for screening mammogram for malignant neoplasm of breast: Secondary | ICD-10-CM

## 2011-01-23 ENCOUNTER — Ambulatory Visit
Admission: RE | Admit: 2011-01-23 | Discharge: 2011-01-23 | Disposition: A | Discharge status: Home / Self Care | Payer: BC Managed Care – PPO | Source: Ambulatory Visit | Attending: Internal Medicine | Admitting: Internal Medicine

## 2011-01-23 DIAGNOSIS — Z1231 Encounter for screening mammogram for malignant neoplasm of breast: Secondary | ICD-10-CM

## 2011-03-04 ENCOUNTER — Encounter: Payer: Self-pay | Admitting: Internal Medicine

## 2011-03-06 ENCOUNTER — Encounter: Payer: Self-pay | Admitting: Internal Medicine

## 2011-03-06 ENCOUNTER — Ambulatory Visit (INDEPENDENT_AMBULATORY_CARE_PROVIDER_SITE_OTHER): Payer: BC Managed Care – PPO | Admitting: Internal Medicine

## 2011-03-06 DIAGNOSIS — I10 Essential (primary) hypertension: Secondary | ICD-10-CM

## 2011-03-06 DIAGNOSIS — K219 Gastro-esophageal reflux disease without esophagitis: Secondary | ICD-10-CM

## 2011-03-06 DIAGNOSIS — E785 Hyperlipidemia, unspecified: Secondary | ICD-10-CM

## 2011-03-06 DIAGNOSIS — M545 Low back pain, unspecified: Secondary | ICD-10-CM

## 2011-03-06 NOTE — Patient Instructions (Signed)
Limit your sodium (Salt) intake    It is important that you exercise regularly, at least 20 minutes 3 to 4 times per week.  If you develop chest pain or shortness of breath seek  medical attention.  Please check your blood pressure on a regular basis.  If it is consistently greater than 150/90, please make an office appointment.  Back Exercises Back exercises help treat and prevent back injuries. The goal of back exercises is to increase the strength of your abdominal and back muscles and the flexibility of your back. These exercises should be started when you no longer have back pain. Back exercises include: 1. Pelvic Tilt - Lie on your back with your knees bent. Tilt your pelvis until the lower part of your back is against the floor. Hold this position 5-10 sec and repeat 5-10 times.  2. Knee to Chest - Pull first one knee up against your chest and hold for 20-30 seconds, repeat this with the other knee, and then both knees. This may be done with the other leg straight or bent, whichever feels better.  3. Sit-Ups or Curl-Ups - Bend your knees 90 degrees. Start with tilting your pelvis, and do a partial, slow sit-up, lifting your trunk only 30-45 degrees off the floor. Take at least 2-3 sec for each sit-up. Do not do sit-ups with your knees out straight. If partial sit-ups are difficult, simply do the above but with only tightening your abdominal muscles and holding it as directed.  4. Hip-Lift - Lie on your back with your knees flexed 90 degrees. Push down with your feet and shoulders as you raise your hips a couple inches off the floor; hold for 10 sec, repeat 5-10 times.  5. Back arches - Lie on your stomach, propping yourself up on bent elbows. Slowly press on your hands, causing an arch in your low back. Repeat 3-5 times. Any initial stiffness and discomfort should lessen with repetition over time.  6. Shoulder-Lifts - Lie face down with arms beside your body. Keep hips and torso pressed to floor  as you slowly lift your head and shoulders off the floor.  Do not overdo your exercises, especially in the beginning. Exercises may cause you some mild back discomfort which lasts for a few minutes; however, if the pain is more severe, or lasts for more than 15 minutes, do not continue exercises until you see your caregiver. Improvement with exercise therapy for back problems is slow.   See your caregivers for assistance with developing a proper back exercise program. Document Released: 11/14/2004 Document Re-Released: 01/03/2009 [facility] Patient Information 2011 [place], [place].Back Exercises Back exercises help treat and prevent back injuries. The goal of back exercises is to increase the strength of your abdominal and back muscles and the flexibility of your back. These exercises should be started when you no longer have back pain. Back exercises include: 1. Pelvic Tilt - Lie on your back with your knees bent. Tilt your pelvis until the lower part of your back is against the floor. Hold this position 5-10 sec and repeat 5-10 times.  2. Knee to Chest - Pull first one knee up against your chest and hold for 20-30 seconds, repeat this with the other knee, and then both knees. This may be done with the other leg straight or bent, whichever feels better.  3. Sit-Ups or Curl-Ups - Bend your knees 90 degrees. Start with tilting your pelvis, and do a partial, slow sit-up, lifting your trunk only  30-45 degrees off the floor. Take at least 2-3 sec for each sit-up. Do not do sit-ups with your knees out straight. If partial sit-ups are difficult, simply do the above but with only tightening your abdominal muscles and holding it as directed.  4. Hip-Lift - Lie on your back with your knees flexed 90 degrees. Push down with your feet and shoulders as you raise your hips a couple inches off the floor; hold for 10 sec, repeat 5-10 times.  5. Back arches - Lie on your stomach, propping yourself up on bent elbows. Slowly  press on your hands, causing an arch in your low back. Repeat 3-5 times. Any initial stiffness and discomfort should lessen with repetition over time.  6. Shoulder-Lifts - Lie face down with arms beside your body. Keep hips and torso pressed to floor as you slowly lift your head and shoulders off the floor.  Do not overdo your exercises, especially in the beginning. Exercises may cause you some mild back discomfort which lasts for a few minutes; however, if the pain is more severe, or lasts for more than 15 minutes, do not continue exercises until you see your caregiver. Improvement with exercise therapy for back problems is slow.   See your caregivers for assistance with developing a proper back exercise program. Document Released: 11/14/2004 Document Re-Released: 01/03/2009 [facility] Patient Information 2011 [place], [place].

## 2011-03-06 NOTE — Progress Notes (Signed)
  Subjective:    Patient ID: Katie Becker, female    DOB: August 11, 1951, 60 y.o.   MRN: [id number]  HPI  60 year old patient who is seen today for followup. She has a history of hypertension and dyslipidemia. She was seen here 6 months ago for an annual exam and laboratory studies were reviewed. She has done quite well does have some occasional back pain which has been fairly stable. No new concerns or complaints. She has some history mild exogenous obesity and unfortunately this is unchanged.    Review of Systems  Constitutional: Negative.   HENT: Negative for hearing loss, congestion, sore throat, rhinorrhea, dental problem, sinus pressure and tinnitus.   Eyes: Negative for pain, discharge and visual disturbance.  Respiratory: Negative for cough and shortness of breath.   Cardiovascular: Negative for chest pain, palpitations and leg swelling.  Gastrointestinal: Negative for nausea, vomiting, abdominal pain, diarrhea, constipation, blood in stool and abdominal distention.  Genitourinary: Negative for dysuria, urgency, frequency, hematuria, flank pain, vaginal bleeding, vaginal discharge, difficulty urinating, vaginal pain and pelvic pain.  Musculoskeletal: Positive for back pain. Negative for joint swelling, arthralgias and gait problem.  Skin: Negative for rash.  Neurological: Negative for dizziness, syncope, speech difficulty, weakness, numbness and headaches.  Hematological: Negative for adenopathy.  Psychiatric/Behavioral: Negative for behavioral problems, dysphoric mood and agitation. The patient is not nervous/anxious.        Objective:   Physical Exam  Constitutional: She is oriented to person, place, and time. She appears well-developed and well-nourished.  HENT:  Head: Normocephalic.  Right Ear: External ear normal.  Left Ear: External ear normal.  Mouth/Throat: Oropharynx is clear and moist.  Eyes: Conjunctivae and EOM are normal. Pupils are equal, round, and reactive to light.   Neck: Normal range of motion. Neck supple. No thyromegaly present.  Cardiovascular: Normal rate, regular rhythm, normal heart sounds and intact distal pulses.   Pulmonary/Chest: Effort normal and breath sounds normal.  Abdominal: Soft. Bowel sounds are normal. She exhibits no mass. There is no tenderness.  Musculoskeletal: Normal range of motion.  Lymphadenopathy:    She has no cervical adenopathy.  Neurological: She is alert and oriented to person, place, and time.  Skin: Skin is warm and dry. No rash noted.  Psychiatric: She has a normal mood and affect. Her behavior is normal.          Assessment & Plan:  Hypertension stable Dyslipidemia. Stable Low back pain. We'll continue symptomatic care. Weight loss exercise all encouraged  Return in 6 months for her annual exam

## 2011-03-08 NOTE — Assessment & Plan Note (Signed)
[facility]                              [facility] OFFICE NOTE   NAME:Katie Becker, Katie Becker                        MRN:          [id number]  DATE:06/02/2006                            DOB:          [birth year]    A 60 year old female seen today for wellness exam.  She has a history of  hypercholesterolemia, reflux, menopausal syndrome.  She is followed for her  gynecologic care.  She is a gravida 2,  para 2, abortus 0.  She has had a  cholecystectomy, a hysterectomy and a tubal ligation.  Medical regimen  reviewed.   REVIEW OF SYSTEMS:  Exam is unremarkable.   Colonoscopy was in the Spring of 2005.   SOCIAL HISTORY:  Husband is stable, approximately 1-1/2 years out from  initial diagnosis of glioblastoma multiforme.   FAMILY HISTORY:  Positive for melanoma,  hypertension, colon cancer and  breast cancer.   EXAM:  Revealed a healthy appearing female in no acute distress.  Blood  pressure was 130/82.  FUNDI/ENT:  Clear.  NECK:  No bruits or adenopathy, no thyroid enlargement.  CHEST:  Clear.  BREASTS:  Negative.  CARDIOVASCULAR EXAM:  Normal heart sounds, no murmurs.  ABDOMEN:  Benign.  EXTREMITIES:  Full peripheral pulses, no edema.  NEURO:  Negative.   IMPRESSION:  1. Hyperlipidemia.  2. Gastroesophageal reflux disease.  3. Menopausal syndrome.   DISPOSITION:  Medical regimen unchanged.  Laboratory studies were done  recently.  Will recheck in 1 year.                                   Marletta Lor, MD   PFK/MedQ  DD:  06/02/2006  DT:  06/02/2006  Job #:  [id number]

## 2011-03-26 ENCOUNTER — Other Ambulatory Visit: Payer: Self-pay

## 2011-03-26 MED ORDER — ESTROGENS CONJUGATED 0.625 MG PO TABS: 0.6250 mg | ORAL_TABLET | Freq: Every day | ORAL | Status: DC

## 2011-03-26 NOTE — Telephone Encounter (Signed)
Faxed back to [employer]

## 2011-09-03 ENCOUNTER — Other Ambulatory Visit (INDEPENDENT_AMBULATORY_CARE_PROVIDER_SITE_OTHER): Payer: BC Managed Care – PPO

## 2011-09-03 DIAGNOSIS — Z0000 Encounter for general adult medical examination without abnormal findings: Secondary | ICD-10-CM

## 2011-09-03 LAB — POCT URINALYSIS DIPSTICK
Bilirubin, UA: NEGATIVE
Blood, UA: NEGATIVE
Glucose, UA: NEGATIVE
Ketones, UA: NEGATIVE
pH, UA: 5.5

## 2011-09-03 LAB — CBC WITH DIFFERENTIAL/PLATELET
Basophils Relative: 0.5 % (ref 0.0–3.0)
Eosinophils Relative: 3.3 % (ref 0.0–5.0)
HCT: 42.6 % (ref 36.0–46.0)
Hemoglobin: 14.7 g/dL (ref 12.0–15.0)
Lymphs Abs: 2.3 10*3/uL (ref 0.7–4.0)
MCV: 88.4 fl (ref 78.0–100.0)
Monocytes Absolute: 0.3 10*3/uL (ref 0.1–1.0)
Neutro Abs: 4.1 10*3/uL (ref 1.4–7.7)
Platelets: 245 10*3/uL (ref 150.0–400.0)
RBC: 4.82 Mil/uL (ref 3.87–5.11)
WBC: 7 10*3/uL (ref 4.5–10.5)

## 2011-09-04 LAB — HEPATIC FUNCTION PANEL
ALT: 39 U/L — ABNORMAL HIGH (ref 0–35)
Albumin: 3.7 g/dL (ref 3.5–5.2)
Total Protein: 6.9 g/dL (ref 6.0–8.3)

## 2011-09-04 LAB — LIPID PANEL
Cholesterol: 178 mg/dL (ref 0–200)
Triglycerides: 214 mg/dL — ABNORMAL HIGH (ref 0.0–149.0)

## 2011-09-04 LAB — BASIC METABOLIC PANEL
BUN: 19 mg/dL (ref 6–23)
Chloride: 101 mEq/L (ref 96–112)
Potassium: 4 mEq/L (ref 3.5–5.1)
Sodium: 141 mEq/L (ref 135–145)

## 2011-09-04 LAB — LDL CHOLESTEROL, DIRECT: Direct LDL: 105.5 mg/dL

## 2011-09-10 ENCOUNTER — Encounter: Payer: Self-pay | Admitting: Internal Medicine

## 2011-09-10 ENCOUNTER — Ambulatory Visit (INDEPENDENT_AMBULATORY_CARE_PROVIDER_SITE_OTHER): Payer: BC Managed Care – PPO | Admitting: Internal Medicine

## 2011-09-10 DIAGNOSIS — E785 Hyperlipidemia, unspecified: Secondary | ICD-10-CM

## 2011-09-10 DIAGNOSIS — Z0000 Encounter for general adult medical examination without abnormal findings: Secondary | ICD-10-CM

## 2011-09-10 DIAGNOSIS — I10 Essential (primary) hypertension: Secondary | ICD-10-CM

## 2011-09-10 MED ORDER — ESTROGENS CONJUGATED 0.625 MG PO TABS: 0.6250 mg | ORAL_TABLET | Freq: Every day | ORAL | Status: DC

## 2011-09-10 MED ORDER — ESOMEPRAZOLE MAGNESIUM 40 MG PO CPDR: 40.0000 mg | DELAYED_RELEASE_CAPSULE | Freq: Every day | ORAL | Status: DC

## 2011-09-10 MED ORDER — ATORVASTATIN CALCIUM 40 MG PO TABS: 40.0000 mg | ORAL_TABLET | Freq: Every day | ORAL | Status: DC

## 2011-09-10 MED ORDER — HYDROCHLOROTHIAZIDE 25 MG PO TABS: 25.0000 mg | ORAL_TABLET | Freq: Every day | ORAL | Status: DC

## 2011-09-10 NOTE — Progress Notes (Signed)
Subjective:    Patient ID: Katie Becker, female    DOB: 09/13/1951, 60 y.o.   MRN: [id number]  HPI  60 year old patient who is seen today for a preventive health examination. She is doing quite well. Medical problems include hypertension and dyslipidemia. She has had a hysterectomy and is on hormone replacement therapy. She is scheduled for gynecologic followup soon. She is doing quite well.  Past Medical History  Diagnosis Date  . DIVERTICULOSIS, COLON 04/20/2007  . GERD 04/20/2007  . HEPATOMEGALY, HX OF 12/21/2009  . HYPERLIPIDEMIA 04/20/2007  . HYPERTENSION 04/20/2007  . LOW BACK PAIN 08/16/2008  . MENOPAUSAL SYNDROME 04/20/2007  . PEPTIC ULCER DISEASE 09/06/2010    History   Social History  . Marital Status: Married    Spouse Name: N/A    Number of Children: N/A  . Years of Education: N/A   Occupational History  . Not on file.   Social History Main Topics  . Smoking status: Former Smoker    Quit date: 10/21/1978  . Smokeless tobacco: Never Used  . Alcohol Use: No  . Drug Use: No  . Sexually Active: Not on file   Other Topics Concern  . Not on file   Social History Narrative  . No narrative on file    Past Surgical History  Procedure Date  . Cholecystectomy   . Abdominal hysterectomy   . Tubal ligation     Family History  Problem Relation Age of Onset  . Hypertension Mother   . Hyperlipidemia Mother   . Cancer Father     hx of melanoma  . Hypertension Sister   . Hypertension Brother   . Cancer Maternal Grandmother     colon    Allergies  Allergen Reactions  . Amoxicillin     REACTION: unspecified  . Sulfamethoxazole     REACTION: unspecified    Current Outpatient Prescriptions on File Prior to Visit  Medication Sig Dispense Refill  . atorvastatin (LIPITOR) 40 MG tablet Take 40 mg by mouth daily.        Marland Kitchen esomeprazole (NEXIUM) 40 MG capsule Take 40 mg by mouth daily before breakfast.        . estrogens, conjugated, (PREMARIN) 0.625 MG tablet Take  1 tablet (0.625 mg total) by mouth daily. Take daily for 21 days then do not take for 7 days.  90 tablet  3  . fexofenadine-pseudoephedrine (ALLEGRA-D) 60-120 MG per tablet Take 1 tablet by mouth 2 (two) times daily.        . hydrochlorothiazide 25 MG tablet Take 25 mg by mouth daily.          BP 120/78  Pulse 70  Temp(Src) 97.8 F (36.6 C) (Oral)  Resp 16  Ht 5' 2.5" (1.588 m)  Wt 174 lb (78.926 kg)  BMI 31.32 kg/m2  SpO2 97%        Review of Systems  Constitutional: Negative for fever, appetite change, fatigue and unexpected weight change.  HENT: Negative for hearing loss, ear pain, nosebleeds, congestion, sore throat, mouth sores, trouble swallowing, neck stiffness, dental problem, voice change, sinus pressure and tinnitus.   Eyes: Negative for photophobia, pain, redness and visual disturbance.  Respiratory: Negative for cough, chest tightness and shortness of breath.   Cardiovascular: Negative for chest pain, palpitations and leg swelling.  Gastrointestinal: Negative for nausea, vomiting, abdominal pain, diarrhea, constipation, blood in stool, abdominal distention and rectal pain.  Genitourinary: Negative for dysuria, urgency, frequency, hematuria, flank pain, vaginal  bleeding, vaginal discharge, difficulty urinating, genital sores, vaginal pain, menstrual problem and pelvic pain.  Musculoskeletal: Negative for back pain and arthralgias.  Skin: Negative for rash.  Neurological: Negative for dizziness, syncope, speech difficulty, weakness, light-headedness, numbness and headaches.  Hematological: Negative for adenopathy. Does not bruise/bleed easily.  Psychiatric/Behavioral: Negative for suicidal ideas, behavioral problems, self-injury, dysphoric mood and agitation. The patient is not nervous/anxious.        Objective:   Physical Exam  Constitutional: She is oriented to person, place, and time. She appears well-developed and well-nourished.  HENT:  Head: Normocephalic and  atraumatic.  Right Ear: External ear normal.  Left Ear: External ear normal.  Mouth/Throat: Oropharynx is clear and moist.  Eyes: Conjunctivae and EOM are normal.  Neck: Normal range of motion. Neck supple. No JVD present. No thyromegaly present.  Cardiovascular: Normal rate, regular rhythm, normal heart sounds and intact distal pulses.   No murmur heard. Pulmonary/Chest: Effort normal and breath sounds normal. She has no wheezes. She has no rales.  Abdominal: Soft. Bowel sounds are normal. She exhibits no distension and no mass. There is no tenderness. There is no rebound and no guarding.  Musculoskeletal: Normal range of motion. She exhibits no edema and no tenderness.  Neurological: She is alert and oriented to person, place, and time. She has normal reflexes. No cranial nerve deficit. She exhibits normal muscle tone. Coordination normal.  Skin: Skin is warm and dry. No rash noted.  Psychiatric: She has a normal mood and affect. Her behavior is normal.          Assessment & Plan:    Preventive health examination Hypertension well controlled Hypercholesterolemia stable we'll check a lipid profile Gastroesophageal reflux disease stable  Weight loss exercise and her reflux regimen I'll encouraged Recheck in one year or when necessary

## 2011-09-10 NOTE — Patient Instructions (Addendum)
You need to lose weight.  Consider a lower calorie diet and regular exercise.  Please check your blood pressure on a regular basis.  If it is consistently greater than 150/90, please make an office appointment.  Limit your sodium (Salt) intake  Avoids foods high in acid such as tomatoes citrus juices, and spicy foods.  Avoid eating within two hours of lying down or before exercising.  Do not overheat.  Try smaller more frequent meals.  If symptoms persist, elevate the head of her bed 12 inches while sleeping.  Gynecology followup  Take a calcium supplement, plus [phone] units of vitamin D

## 2011-10-01 ENCOUNTER — Other Ambulatory Visit: Payer: Self-pay | Admitting: Internal Medicine

## 2011-12-24 ENCOUNTER — Other Ambulatory Visit: Payer: Self-pay | Admitting: Internal Medicine

## 2011-12-24 DIAGNOSIS — Z1231 Encounter for screening mammogram for malignant neoplasm of breast: Secondary | ICD-10-CM

## 2011-12-31 ENCOUNTER — Ambulatory Visit (INDEPENDENT_AMBULATORY_CARE_PROVIDER_SITE_OTHER): Payer: BC Managed Care – PPO | Admitting: Internal Medicine

## 2011-12-31 ENCOUNTER — Encounter: Payer: Self-pay | Admitting: Internal Medicine

## 2011-12-31 VITALS — BP 114/80 | Temp 97.0°F | Wt 175.0 lb

## 2011-12-31 DIAGNOSIS — I10 Essential (primary) hypertension: Secondary | ICD-10-CM

## 2011-12-31 NOTE — Progress Notes (Signed)
  Subjective:    Patient ID: Katie Becker, female    DOB: 1951-07-01, 61 y.o.   MRN: [id number]  HPI 61 year old patient who has a history of treated hypertension. For the past few weeks she has noted a slightly tender nodule involving the oral cavity. She states when she eats this area swells becomes slightly more tender. She is a former tobacco user.      Review of Systems  HENT: Positive for sore throat.        Objective:   Physical Exam  Constitutional: She appears well-developed and well-nourished. No distress.       Blood pressure well controlled  HENT:  Head: Normocephalic and atraumatic.  Right Ear: External ear normal.  Left Ear: External ear normal.  Mouth/Throat: Oropharynx is clear and moist. No oropharyngeal exudate.       Visual inspection of the hard palate oropharynx unremarkable. There did appear to be a slightly tender 2-3 mm subcutaneous nodule involving the left hard palate just beyond the molars. No ulceration noted  Neck: Normal range of motion. No thyromegaly present.  Lymphadenopathy:    She has no cervical adenopathy.          Assessment & Plan:  Hypertension Probable benign cyst of the oropharynx. We'll clinically observe at this time;  if this does not resolve or she develops worsening symptoms will refer to ENT

## 2011-12-31 NOTE — Patient Instructions (Signed)
Call or return to clinic prn if these symptoms worsen or fail to improve as anticipated.

## 2012-01-10 ENCOUNTER — Other Ambulatory Visit: Payer: Self-pay | Admitting: Internal Medicine

## 2012-01-22 ENCOUNTER — Other Ambulatory Visit: Payer: Self-pay | Admitting: Internal Medicine

## 2012-01-24 ENCOUNTER — Other Ambulatory Visit: Payer: Self-pay | Admitting: Obstetrics and Gynecology

## 2012-01-24 ENCOUNTER — Ambulatory Visit
Admission: RE | Admit: 2012-01-24 | Discharge: 2012-01-24 | Disposition: A | Discharge status: Home / Self Care | Payer: BC Managed Care – PPO | Source: Ambulatory Visit | Attending: Internal Medicine | Admitting: Internal Medicine

## 2012-01-24 DIAGNOSIS — Z1231 Encounter for screening mammogram for malignant neoplasm of breast: Secondary | ICD-10-CM

## 2012-01-27 ENCOUNTER — Other Ambulatory Visit: Payer: Self-pay | Admitting: Obstetrics and Gynecology

## 2012-01-27 DIAGNOSIS — Z780 Asymptomatic menopausal state: Secondary | ICD-10-CM

## 2012-01-30 ENCOUNTER — Ambulatory Visit
Admission: RE | Admit: 2012-01-30 | Discharge: 2012-01-30 | Disposition: A | Discharge status: Home / Self Care | Payer: BC Managed Care – PPO | Source: Ambulatory Visit | Attending: Obstetrics and Gynecology | Admitting: Obstetrics and Gynecology

## 2012-01-30 DIAGNOSIS — Z780 Asymptomatic menopausal state: Secondary | ICD-10-CM

## 2012-03-06 ENCOUNTER — Encounter: Payer: Self-pay | Admitting: Internal Medicine

## 2012-03-06 ENCOUNTER — Ambulatory Visit (INDEPENDENT_AMBULATORY_CARE_PROVIDER_SITE_OTHER): Payer: BC Managed Care – PPO | Admitting: Internal Medicine

## 2012-03-06 VITALS — BP 110/74 | Temp 97.6°F | Wt 178.0 lb

## 2012-03-06 DIAGNOSIS — I10 Essential (primary) hypertension: Secondary | ICD-10-CM

## 2012-03-06 DIAGNOSIS — R109 Unspecified abdominal pain: Secondary | ICD-10-CM

## 2012-03-06 NOTE — Progress Notes (Signed)
  Subjective:    Patient ID: Katie Becker, female    DOB: [birth year], 61 y.o.   MRN: [id number]  HPI-year-old patient who has a history of treated hypertension. She also has a history of diverticulosis. She presents today with a three-day history of intermittent crampy diffuse abdominal pain associated with some bloating. No fever nausea vomiting change in her bowel habits or appetite. Pain seems fairly minor. She has started a probiotic and feels that she has improved. She is on chronic PPI therapy and does have remote history of peptic ulcer disease.      Review of Systems  Constitutional: Negative.   HENT: Negative for hearing loss, congestion, sore throat, rhinorrhea, dental problem, sinus pressure and tinnitus.   Eyes: Negative for pain, discharge and visual disturbance.  Respiratory: Negative for cough and shortness of breath.   Cardiovascular: Negative for chest pain, palpitations and leg swelling.  Gastrointestinal: Positive for abdominal pain. Negative for nausea, vomiting, diarrhea, constipation, blood in stool and abdominal distention (bloating).  Genitourinary: Negative for dysuria, urgency, frequency, hematuria, flank pain, vaginal bleeding, vaginal discharge, difficulty urinating, vaginal pain and pelvic pain.  Musculoskeletal: Negative for joint swelling, arthralgias and gait problem.  Skin: Negative for rash.  Neurological: Negative for dizziness, syncope, speech difficulty, weakness, numbness and headaches.  Hematological: Negative for adenopathy.  Psychiatric/Behavioral: Negative for behavioral problems, dysphoric mood and agitation. The patient is not nervous/anxious.        Objective:   Physical Exam  Constitutional: She is oriented to person, place, and time. She appears well-developed and well-nourished.  HENT:  Head: Normocephalic.  Right Ear: External ear normal.  Left Ear: External ear normal.  Mouth/Throat: Oropharynx is clear and moist.  Eyes: Conjunctivae  and EOM are normal. Pupils are equal, round, and reactive to light.  Neck: Normal range of motion. Neck supple. No thyromegaly present.  Cardiovascular: Normal rate, regular rhythm, normal heart sounds and intact distal pulses.   Pulmonary/Chest: Effort normal and breath sounds normal.  Abdominal: Soft. Bowel sounds are normal. She exhibits no distension and no mass. There is no tenderness. There is no rebound and no guarding.  Musculoskeletal: Normal range of motion.  Lymphadenopathy:    She has no cervical adenopathy.  Neurological: She is alert and oriented to person, place, and time.  Skin: Skin is warm and dry. No rash noted.  Psychiatric: She has a normal mood and affect. Her behavior is normal.          Assessment & Plan:  Nonspecific abdominal pain. We'll place on a probiotic at this time and basically clinically observe clinical exam is benign Hypertension well controlled  Recheck 6 months or as needed

## 2012-03-06 NOTE — Patient Instructions (Signed)
Call or return to clinic prn if these symptoms worsen or fail to improve as anticipated.

## 2012-06-22 ENCOUNTER — Other Ambulatory Visit: Payer: Self-pay | Admitting: Internal Medicine

## 2012-07-08 ENCOUNTER — Ambulatory Visit (INDEPENDENT_AMBULATORY_CARE_PROVIDER_SITE_OTHER): Payer: BC Managed Care – PPO | Admitting: Internal Medicine

## 2012-07-08 DIAGNOSIS — Z23 Encounter for immunization: Secondary | ICD-10-CM

## 2012-09-07 ENCOUNTER — Other Ambulatory Visit (INDEPENDENT_AMBULATORY_CARE_PROVIDER_SITE_OTHER): Payer: BC Managed Care – PPO

## 2012-09-07 DIAGNOSIS — Z0000 Encounter for general adult medical examination without abnormal findings: Secondary | ICD-10-CM

## 2012-09-07 LAB — POCT URINALYSIS DIPSTICK
Protein, UA: NEGATIVE
Spec Grav, UA: 1.01
Urobilinogen, UA: 0.2

## 2012-09-07 LAB — LIPID PANEL
HDL: 49.3 mg/dL (ref >39.00)
LDL Cholesterol: 116 mg/dL — ABNORMAL HIGH (ref 0–99)
Total CHOL/HDL Ratio: 4
Triglycerides: 174 mg/dL — ABNORMAL HIGH (ref 0.0–149.0)
VLDL: 34.8 mg/dL (ref 0.0–40.0)

## 2012-09-07 LAB — CBC WITH DIFFERENTIAL/PLATELET
Basophils Relative: 0.4 % (ref 0.0–3.0)
Eosinophils Relative: 3.9 % (ref 0.0–5.0)
Hemoglobin: 14.4 g/dL (ref 12.0–15.0)
Lymphocytes Relative: 32 % (ref 12.0–46.0)
Neutrophils Relative %: 58.1 % (ref 43.0–77.0)
RBC: 4.9 Mil/uL (ref 3.87–5.11)
WBC: 7.3 10*3/uL (ref 4.5–10.5)

## 2012-09-07 LAB — BASIC METABOLIC PANEL
BUN: 18 mg/dL (ref 6–23)
CO2: 28 mEq/L (ref 19–32)
Chloride: 100 mEq/L (ref 96–112)
GFR: 67.12 mL/min (ref >60.00)
Glucose, Bld: 115 mg/dL — ABNORMAL HIGH (ref 70–99)
Potassium: 3.2 mEq/L — ABNORMAL LOW (ref 3.5–5.1)
Sodium: 138 mEq/L (ref 135–145)

## 2012-09-07 LAB — TSH: TSH: 1.55 u[IU]/mL (ref 0.35–5.50)

## 2012-09-07 LAB — HEPATIC FUNCTION PANEL
ALT: 82 U/L — ABNORMAL HIGH (ref 0–35)
Total Bilirubin: 0.8 mg/dL (ref 0.3–1.2)

## 2012-09-15 ENCOUNTER — Ambulatory Visit (INDEPENDENT_AMBULATORY_CARE_PROVIDER_SITE_OTHER): Payer: BC Managed Care – PPO | Admitting: Internal Medicine

## 2012-09-15 ENCOUNTER — Encounter: Payer: Self-pay | Admitting: Internal Medicine

## 2012-09-15 VITALS — BP 120/80 | Temp 97.9°F | Ht 63.0 in | Wt 184.0 lb

## 2012-09-15 DIAGNOSIS — Z8719 Personal history of other diseases of the digestive system: Secondary | ICD-10-CM

## 2012-09-15 DIAGNOSIS — I10 Essential (primary) hypertension: Secondary | ICD-10-CM

## 2012-09-15 DIAGNOSIS — E785 Hyperlipidemia, unspecified: Secondary | ICD-10-CM

## 2012-09-15 DIAGNOSIS — Z0000 Encounter for general adult medical examination without abnormal findings: Secondary | ICD-10-CM

## 2012-09-15 NOTE — Patient Instructions (Signed)
It is important that you exercise regularly, at least 20 minutes 3 to 4 times per week.  If you develop chest pain or shortness of breath seek  medical attention.  You need to lose weight.  Consider a lower calorie diet and regular exercise.  Take a calcium supplement, plus 800-1200 units of vitamin D  Return in one year for follow-up   

## 2012-09-15 NOTE — Progress Notes (Signed)
Patient ID: Katie Becker, female   DOB: 1950/10/25, 61 y.o.   MRN: [id number]  Subjective:    Patient ID: Katie Becker, female    DOB: Mar 06, 1951, 61 y.o.   MRN: [id number]  Hypertension Pertinent negatives include no chest pain, headaches, palpitations or shortness of breath.  61  year old patient who is seen today for a preventive health examination. She is doing quite well. Medical problems include hypertension and dyslipidemia. She has had a hysterectomy and is on hormone replacement therapy. She is scheduled for gynecologic followup soon. She is doing quite well.  Wt Readings from Last 3 Encounters:  09/15/12 184 lb (83.462 kg)  03/06/12 178 lb (80.74 kg)  12/31/11 175 lb (79.379 kg)    Past Medical History  Diagnosis Date  . DIVERTICULOSIS, COLON 04/20/2007  . GERD 04/20/2007  . HEPATOMEGALY, HX OF 12/21/2009  . HYPERLIPIDEMIA 04/20/2007  . HYPERTENSION 04/20/2007  . LOW BACK PAIN 08/16/2008  . MENOPAUSAL SYNDROME 04/20/2007  . PEPTIC ULCER DISEASE 09/06/2010    History   Social History  . Marital Status: Married    Spouse Name: N/A    Number of Children: N/A  . Years of Education: N/A   Occupational History  . Not on file.   Social History Main Topics  . Smoking status: Former Smoker    Quit date: 10/21/1978  . Smokeless tobacco: Never Used  . Alcohol Use: No  . Drug Use: No  . Sexually Active: Not on file   Other Topics Concern  . Not on file   Social History Narrative  . No narrative on file    Past Surgical History  Procedure Date  . Cholecystectomy   . Abdominal hysterectomy   . Tubal ligation     Family History  Problem Relation Age of Onset  . Hypertension Mother   . Hyperlipidemia Mother   . Cancer Father     hx of melanoma  . Hypertension Sister   . Hypertension Brother   . Cancer Maternal Grandmother     colon    Allergies  Allergen Reactions  . Amoxicillin     REACTION: unspecified  . Sulfamethoxazole     REACTION: unspecified     Current Outpatient Prescriptions on File Prior to Visit  Medication Sig Dispense Refill  . fexofenadine-pseudoephedrine (ALLEGRA-D) 60-120 MG per tablet Take 1 tablet by mouth 2 (two) times daily.        . hydrochlorothiazide (HYDRODIURIL) 25 MG tablet TAKE 1 TABLET DAILY  90 tablet  3  . LIPITOR 40 MG tablet TAKE 1 TABLET DAILY  90 tablet  3  . NEXIUM 40 MG capsule TAKE 1 CAPSULE DAILY  90 each  3  . PREMARIN 0.625 MG tablet TAKE 1 TABLET DAILY  90 tablet  3    BP 120/80  Temp 97.9 F (36.6 C) (Oral)  Ht 5\' 3"  (1.6 m)  Wt 184 lb (83.462 kg)  BMI 32.59 kg/m2        Review of Systems  Constitutional: Positive for unexpected weight change. Negative for fever, appetite change and fatigue.  HENT: Negative for hearing loss, ear pain, nosebleeds, congestion, sore throat, mouth sores, trouble swallowing, neck stiffness, dental problem, voice change, sinus pressure and tinnitus.   Eyes: Negative for photophobia, pain, redness and visual disturbance.  Respiratory: Negative for cough, chest tightness and shortness of breath.   Cardiovascular: Negative for chest pain, palpitations and leg swelling.  Gastrointestinal: Negative for nausea, vomiting, abdominal pain, diarrhea, constipation,  blood in stool, abdominal distention and rectal pain.  Genitourinary: Negative for dysuria, urgency, frequency, hematuria, flank pain, vaginal bleeding, vaginal discharge, difficulty urinating, genital sores, vaginal pain, menstrual problem and pelvic pain.  Musculoskeletal: Positive for back pain. Negative for arthralgias.  Skin: Negative for rash.  Neurological: Negative for dizziness, syncope, speech difficulty, weakness, light-headedness, numbness and headaches.  Hematological: Negative for adenopathy. Does not bruise/bleed easily.  Psychiatric/Behavioral: Negative for suicidal ideas, behavioral problems, self-injury, dysphoric mood and agitation. The patient is not nervous/anxious.         Objective:   Physical Exam  Constitutional: She is oriented to person, place, and time. She appears well-developed and well-nourished.  HENT:  Head: Normocephalic and atraumatic.  Right Ear: External ear normal.  Left Ear: External ear normal.  Mouth/Throat: Oropharynx is clear and moist.  Eyes: Conjunctivae normal and EOM are normal.  Neck: Normal range of motion. Neck supple. No JVD present. No thyromegaly present.  Cardiovascular: Normal rate, regular rhythm, normal heart sounds and intact distal pulses.   No murmur heard. Pulmonary/Chest: Effort normal and breath sounds normal. She has no wheezes. She has no rales.  Abdominal: Soft. Bowel sounds are normal. She exhibits no distension and no mass. There is no tenderness. There is no rebound and no guarding.       Liver edge palpable on inspiration  Musculoskeletal: Normal range of motion. She exhibits no edema and no tenderness.  Neurological: She is alert and oriented to person, place, and time. She has normal reflexes. No cranial nerve deficit. She exhibits normal muscle tone. Coordination normal.  Skin: Skin is warm and dry. No rash noted.  Psychiatric: She has a normal mood and affect. Her behavior is normal.          Assessment & Plan:    Preventive health examination Hypertension well controlled Hypercholesterolemia stable we'll check a lipid profile Gastroesophageal reflux disease stable  Weight loss exercise and her reflux regimen I'll encouraged Recheck in one year or when necessary

## 2012-09-16 LAB — HEPATITIS C ANTIBODY: HCV Ab: NEGATIVE

## 2012-12-15 ENCOUNTER — Other Ambulatory Visit: Payer: Self-pay | Admitting: Internal Medicine

## 2012-12-15 DIAGNOSIS — Z1231 Encounter for screening mammogram for malignant neoplasm of breast: Secondary | ICD-10-CM

## 2012-12-24 ENCOUNTER — Encounter: Payer: Self-pay | Admitting: Internal Medicine

## 2012-12-24 ENCOUNTER — Ambulatory Visit (INDEPENDENT_AMBULATORY_CARE_PROVIDER_SITE_OTHER): Payer: BC Managed Care – PPO | Admitting: Internal Medicine

## 2012-12-24 VITALS — BP 124/80 | Temp 97.4°F | Wt 181.0 lb

## 2012-12-24 DIAGNOSIS — I10 Essential (primary) hypertension: Secondary | ICD-10-CM

## 2012-12-24 DIAGNOSIS — M545 Low back pain, unspecified: Secondary | ICD-10-CM

## 2012-12-24 DIAGNOSIS — E785 Hyperlipidemia, unspecified: Secondary | ICD-10-CM

## 2012-12-24 NOTE — Progress Notes (Signed)
  Subjective:    Patient ID: Katie Becker, female    DOB: 10-Sep-1951, 62 y.o.   MRN: [id number]  HPI  62 year old patient who is seen today for followup. She has treated hypertension. Her mother has recently sustained a hip fracture and presently is requiring comprehensive rehabilitation in a facility. The patient and her sister requires FMLA form completion. Her mother will require 3-8 weeks of compensated inpatient rehabilitation.    Review of Systems  Constitutional: Negative.   HENT: Negative for hearing loss, congestion, sore throat, rhinorrhea, dental problem, sinus pressure and tinnitus.   Eyes: Negative for pain, discharge and visual disturbance.  Respiratory: Negative for cough and shortness of breath.   Cardiovascular: Negative for chest pain, palpitations and leg swelling.  Gastrointestinal: Negative for nausea, vomiting, abdominal pain, diarrhea, constipation, blood in stool and abdominal distention.  Genitourinary: Negative for dysuria, urgency, frequency, hematuria, flank pain, vaginal bleeding, vaginal discharge, difficulty urinating, vaginal pain and pelvic pain.  Musculoskeletal: Negative for joint swelling, arthralgias and gait problem.  Skin: Negative for rash.  Neurological: Negative for dizziness, syncope, speech difficulty, weakness, numbness and headaches.  Hematological: Negative for adenopathy.  Psychiatric/Behavioral: Negative for behavioral problems, dysphoric mood and agitation. The patient is not nervous/anxious.        Objective:   Physical Exam  Constitutional: She is oriented to person, place, and time. She appears well-developed and well-nourished.  HENT:  Head: Normocephalic.  Right Ear: External ear normal.  Left Ear: External ear normal.  Mouth/Throat: Oropharynx is clear and moist.  Eyes: Conjunctivae and EOM are normal. Pupils are equal, round, and reactive to light.  Neck: Normal range of motion. Neck supple. No thyromegaly present.   Cardiovascular: Normal rate, regular rhythm, normal heart sounds and intact distal pulses.   Pulmonary/Chest: Effort normal and breath sounds normal.  Abdominal: Soft. Bowel sounds are normal. She exhibits no mass. There is no tenderness.  Musculoskeletal: Normal range of motion.  Lymphadenopathy:    She has no cervical adenopathy.  Neurological: She is alert and oriented to person, place, and time.  Skin: Skin is warm and dry. No rash noted.  Psychiatric: She has a normal mood and affect. Her behavior is normal.          Assessment & Plan:   Hypertension Dyslipidemia Gastroesophageal reflux disease Low back pain. Stable  We'll continue present regimen FMLA  forms completed

## 2012-12-24 NOTE — Patient Instructions (Signed)
Limit your sodium (Salt) intake    It is important that you exercise regularly, at least 20 minutes 3 to 4 times per week.  If you develop chest pain or shortness of breath seek  medical attention.  You need to lose weight.  Consider a lower calorie diet and regular exercise.  Return in 6 months for follow-up   

## 2013-01-25 ENCOUNTER — Other Ambulatory Visit: Payer: Self-pay | Admitting: Internal Medicine

## 2013-01-25 ENCOUNTER — Ambulatory Visit
Admission: RE | Admit: 2013-01-25 | Discharge: 2013-01-25 | Disposition: A | Discharge status: Home / Self Care | Payer: BC Managed Care – PPO | Source: Ambulatory Visit | Attending: Internal Medicine | Admitting: Internal Medicine

## 2013-01-25 DIAGNOSIS — Z1231 Encounter for screening mammogram for malignant neoplasm of breast: Secondary | ICD-10-CM

## 2013-01-25 DIAGNOSIS — R928 Other abnormal and inconclusive findings on diagnostic imaging of breast: Secondary | ICD-10-CM

## 2013-02-05 ENCOUNTER — Ambulatory Visit
Admission: RE | Admit: 2013-02-05 | Discharge: 2013-02-05 | Disposition: A | Discharge status: Home / Self Care | Payer: BC Managed Care – PPO | Source: Ambulatory Visit | Attending: Internal Medicine | Admitting: Internal Medicine

## 2013-02-05 DIAGNOSIS — R928 Other abnormal and inconclusive findings on diagnostic imaging of breast: Secondary | ICD-10-CM

## 2013-03-05 ENCOUNTER — Encounter: Payer: Self-pay | Admitting: Internal Medicine

## 2013-03-05 ENCOUNTER — Ambulatory Visit (INDEPENDENT_AMBULATORY_CARE_PROVIDER_SITE_OTHER): Payer: BC Managed Care – PPO | Admitting: Internal Medicine

## 2013-03-05 ENCOUNTER — Telehealth: Payer: Self-pay | Admitting: Internal Medicine

## 2013-03-05 VITALS — BP 140/80 | HR 84 | Temp 97.9°F | Resp 20 | Wt 183.0 lb

## 2013-03-05 DIAGNOSIS — R300 Dysuria: Secondary | ICD-10-CM

## 2013-03-05 DIAGNOSIS — E785 Hyperlipidemia, unspecified: Secondary | ICD-10-CM

## 2013-03-05 DIAGNOSIS — I10 Essential (primary) hypertension: Secondary | ICD-10-CM

## 2013-03-05 LAB — POCT URINALYSIS DIPSTICK
Glucose, UA: NEGATIVE
Nitrite, UA: NEGATIVE
Urobilinogen, UA: 1
pH, UA: 7

## 2013-03-05 MED ORDER — CIPROFLOXACIN HCL 500 MG PO TABS: 500.0000 mg | ORAL_TABLET | Freq: Two times a day (BID) | ORAL | Status: DC

## 2013-03-05 NOTE — Telephone Encounter (Signed)
Spoke to pt told her re sent Rx to local pharmacy for Cipro.and I will call [facility] to cancel Rx. Pt verbalized understanding.  Called [facility] and spoke to Gabriel Rung he said unable to cancel Rx has already been processed and is being shipped out.    Called pt back and left detailed message that Rx was unable to be cancelled and is being shipped.

## 2013-03-05 NOTE — Telephone Encounter (Signed)
Pt needs the ciprofloxacin (CIPRO) 500 MG tablet today!! RX was sent to [facility]. Please send to local pharm: [facility], [address]

## 2013-03-05 NOTE — Patient Instructions (Addendum)
Take your antibiotic as prescribed until ALL of it is gone, but stop if you develop a rash, swelling, or any side effects of the medication.  Contact our office as soon as possible if  there are side effects of the medication.Urinary Tract Infection Urinary tract infections (UTIs) can develop anywhere along your urinary tract. Your urinary tract is your body's drainage system for removing wastes and extra water. Your urinary tract includes two kidneys, two ureters, a bladder, and a urethra. Your kidneys are a pair of bean-shaped organs. Each kidney is about the size of your fist. They are located below your ribs, one on each side of your spine. CAUSES Infections are caused by microbes, which are microscopic organisms, including fungi, viruses, and bacteria. These organisms are so small that they can only be seen through a microscope. Bacteria are the microbes that most commonly cause UTIs. SYMPTOMS  Symptoms of UTIs may vary by age and gender of the patient and by the location of the infection. Symptoms in young women typically include a frequent and intense urge to urinate and a painful, burning feeling in the bladder or urethra during urination. Older women and men are more likely to be tired, shaky, and weak and have muscle aches and abdominal pain. A fever may mean the infection is in your kidneys. Other symptoms of a kidney infection include pain in your back or sides below the ribs, nausea, and vomiting. DIAGNOSIS To diagnose a UTI, your caregiver will ask you about your symptoms. Your caregiver also will ask to provide a urine sample. The urine sample will be tested for bacteria and white blood cells. White blood cells are made by your body to help fight infection. TREATMENT  Typically, UTIs can be treated with medication. Because most UTIs are caused by a bacterial infection, they usually can be treated with the use of antibiotics. The choice of antibiotic and length of treatment depend on your  symptoms and the type of bacteria causing your infection. HOME CARE INSTRUCTIONS  If you were prescribed antibiotics, take them exactly as your caregiver instructs you. Finish the medication even if you feel better after you have only taken some of the medication.  Drink enough water and fluids to keep your urine clear or pale yellow.  Avoid caffeine, tea, and carbonated beverages. They tend to irritate your bladder.  Empty your bladder often. Avoid holding urine for long periods of time.  Empty your bladder before and after sexual intercourse.  After a bowel movement, women should cleanse from front to back. Use each tissue only once. SEEK MEDICAL CARE IF:   You have back pain.  You develop a fever.  Your symptoms do not begin to resolve within 3 days. SEEK IMMEDIATE MEDICAL CARE IF:   You have severe back pain or lower abdominal pain.  You develop chills.  You have nausea or vomiting.  You have continued burning or discomfort with urination. MAKE SURE YOU:   Understand these instructions.  Will watch your condition.  Will get help right away if you are not doing well or get worse. Document Released: 07/17/2005 Document Revised: 04/07/2012 Document Reviewed: 11/15/2011 [employer] Patient Information 2013 [employer].  

## 2013-03-05 NOTE — Progress Notes (Signed)
  Subjective:    Patient ID: Katie Becker, female    DOB: [birth year], 62 y.o.   MRN: [id number]  HPI   62 year old patient who presents with a three-day history of urinary frequency and burning dysuria. UA was reviewed and did reveal mild pyuria. She has multiple allergies to a variety of antibiotics  Most of the encounter dealt with further questions or concerns about her mother and management of her chronic pain  Past Medical History  Diagnosis Date  . DIVERTICULOSIS, COLON 04/20/2007  . GERD 04/20/2007  . HEPATOMEGALY, HX OF 12/21/2009  . HYPERLIPIDEMIA 04/20/2007  . HYPERTENSION 04/20/2007  . LOW BACK PAIN 08/16/2008  . MENOPAUSAL SYNDROME 04/20/2007  . PEPTIC ULCER DISEASE 09/06/2010    History   Social History  . Marital Status: Married    Spouse Name: N/A    Number of Children: N/A  . Years of Education: N/A   Occupational History  . Not on file.   Social History Main Topics  . Smoking status: Former Smoker    Quit date: 10/21/1978  . Smokeless tobacco: Never Used  . Alcohol Use: No  . Drug Use: No  . Sexually Active: Not on file   Other Topics Concern  . Not on file   Social History Narrative  . No narrative on file    Past Surgical History  Procedure Laterality Date  . Cholecystectomy    . Abdominal hysterectomy    . Tubal ligation      Family History  Problem Relation Age of Onset  . Hypertension Mother   . Hyperlipidemia Mother   . Cancer Father     hx of melanoma  . Hypertension Sister   . Hypertension Brother   . Cancer Maternal Grandmother     colon    Allergies  Allergen Reactions  . Penicillins Anaphylaxis  . Erythromycin Nausea And Vomiting  . Amoxicillin     REACTION: unspecified  . Sulfamethoxazole     REACTION: unspecified    Current Outpatient Prescriptions on File Prior to Visit  Medication Sig Dispense Refill  . hydrochlorothiazide (HYDRODIURIL) 25 MG tablet TAKE 1 TABLET DAILY  90 tablet  3  . LIPITOR 40 MG tablet TAKE 1  TABLET DAILY  90 tablet  3  . NEXIUM 40 MG capsule TAKE 1 CAPSULE DAILY  90 each  3   No current facility-administered medications on file prior to visit.    BP 140/80  Pulse 84  Temp(Src) 97.9 F (36.6 C) (Oral)  Resp 20  Wt 183 lb (83.008 kg)  BMI 32.43 kg/m2  SpO2 95%     Review of Systems  Genitourinary: Positive for dysuria, frequency and difficulty urinating. Negative for hematuria and flank pain.       Objective:   Physical Exam  Constitutional: She appears well-developed and well-nourished. No distress.  Abdominal: Soft. Bowel sounds are normal. She exhibits no distension. There is no tenderness. There is no rebound.          Assessment & Plan:   Acute cystitis. Will treat with Cipro for 3 days Hypertension stable. Continue hydrochlorothiazide Dyslipidemia. Continue atorvastatin

## 2013-03-09 ENCOUNTER — Encounter: Payer: Self-pay | Admitting: Internal Medicine

## 2013-03-10 MED ORDER — HYDROCHLOROTHIAZIDE 25 MG PO TABS: ORAL_TABLET | ORAL | Status: DC

## 2013-03-15 ENCOUNTER — Encounter: Payer: Self-pay | Admitting: Internal Medicine

## 2013-03-17 ENCOUNTER — Encounter: Payer: Self-pay | Admitting: Internal Medicine

## 2013-03-22 ENCOUNTER — Encounter: Payer: Self-pay | Admitting: Family Medicine

## 2013-03-22 ENCOUNTER — Ambulatory Visit (INDEPENDENT_AMBULATORY_CARE_PROVIDER_SITE_OTHER): Payer: BC Managed Care – PPO | Admitting: Family Medicine

## 2013-03-22 VITALS — BP 120/70 | Temp 98.2°F | Wt 181.0 lb

## 2013-03-22 DIAGNOSIS — T148 Other injury of unspecified body region: Secondary | ICD-10-CM

## 2013-03-22 DIAGNOSIS — W57XXXA Bitten or stung by nonvenomous insect and other nonvenomous arthropods, initial encounter: Secondary | ICD-10-CM

## 2013-03-22 NOTE — Progress Notes (Signed)
Chief Complaint  Patient presents with  . multiple tick bites    HPI:  Tick bites: -bit by 3 ticks  -healing well -just wanted to check  -were small ticks and were not biting for long and she removed them -denies fever, HA, malaise, rash  ROS: See pertinent positives and negatives per HPI.  Past Medical History  Diagnosis Date  . DIVERTICULOSIS, COLON 04/20/2007  . GERD 04/20/2007  . HEPATOMEGALY, HX OF 12/21/2009  . HYPERLIPIDEMIA 04/20/2007  . HYPERTENSION 04/20/2007  . LOW BACK PAIN 08/16/2008  . MENOPAUSAL SYNDROME 04/20/2007  . PEPTIC ULCER DISEASE 09/06/2010    Family History  Problem Relation Age of Onset  . Hypertension Mother   . Hyperlipidemia Mother   . Cancer Father     hx of melanoma  . Hypertension Sister   . Hypertension Brother   . Cancer Maternal Grandmother     colon    History   Social History  . Marital Status: Married    Spouse Name: N/A    Number of Children: N/A  . Years of Education: N/A   Social History Main Topics  . Smoking status: Former Smoker    Quit date: 10/21/1978  . Smokeless tobacco: Never Used  . Alcohol Use: No  . Drug Use: No  . Sexually Active: None   Other Topics Concern  . None   Social History Narrative  . None    Current outpatient prescriptions:hydrochlorothiazide (HYDRODIURIL) 25 MG tablet, TAKE 1 TABLET DAILY, Disp: 90 tablet, Rfl: 3;  LIPITOR 40 MG tablet, TAKE 1 TABLET DAILY, Disp: 90 tablet, Rfl: 3;  NEXIUM 40 MG capsule, TAKE 1 CAPSULE DAILY, Disp: 90 each, Rfl: 3;  ciprofloxacin (CIPRO) 500 MG tablet, Take 1 tablet (500 mg total) by mouth 2 (two) times daily., Disp: 6 tablet, Rfl: 0  EXAM:  Filed Vitals:   03/22/13 0804  BP: 120/70  Temp: 98.2 F (36.8 C)    Body mass index is 32.07 kg/(m^2).  GENERAL: vitals reviewed and listed above, alert, oriented, appears well hydrated and in no acute distress  HEENT: atraumatic, conjunttiva clear, no obvious abnormalities on inspection of external nose and  ears  NECK: no obvious masses on inspection  SKIN: 2 small healed papules L arm and L buttock. No sings of infection.  MS: moves all extremities without noticeable abnormality  PSYCH: pleasant and cooperative, no obvious depression or anxiety  ASSESSMENT AND PLAN:  Discussed the following assessment and plan:  Tick bite  -healing well, discussed tick borne illnesses and signs to look for, return precautions -Patient advised to return or notify a doctor immediately if symptoms worsen or persist or new concerns arise.  There are no Patient Instructions on file for this visit.   Kriste Basque R.

## 2013-05-31 ENCOUNTER — Encounter: Payer: Self-pay | Admitting: Internal Medicine

## 2013-07-01 ENCOUNTER — Other Ambulatory Visit: Payer: Self-pay | Admitting: Internal Medicine

## 2013-07-01 DIAGNOSIS — R921 Mammographic calcification found on diagnostic imaging of breast: Secondary | ICD-10-CM

## 2013-07-31 ENCOUNTER — Encounter: Payer: Self-pay | Admitting: Internal Medicine

## 2013-08-09 ENCOUNTER — Ambulatory Visit (INDEPENDENT_AMBULATORY_CARE_PROVIDER_SITE_OTHER): Payer: BC Managed Care – PPO | Admitting: *Deleted

## 2013-08-09 DIAGNOSIS — Z23 Encounter for immunization: Secondary | ICD-10-CM

## 2013-08-10 ENCOUNTER — Ambulatory Visit
Admission: RE | Admit: 2013-08-10 | Discharge: 2013-08-10 | Disposition: A | Discharge status: Home / Self Care | Payer: BC Managed Care – PPO | Source: Ambulatory Visit | Attending: Internal Medicine | Admitting: Internal Medicine

## 2013-08-10 ENCOUNTER — Other Ambulatory Visit: Payer: Self-pay | Admitting: Internal Medicine

## 2013-08-10 DIAGNOSIS — R921 Mammographic calcification found on diagnostic imaging of breast: Secondary | ICD-10-CM

## 2013-08-18 ENCOUNTER — Ambulatory Visit
Admission: RE | Admit: 2013-08-18 | Discharge: 2013-08-18 | Disposition: A | Discharge status: Home / Self Care | Payer: BC Managed Care – PPO | Source: Ambulatory Visit | Attending: Internal Medicine | Admitting: Internal Medicine

## 2013-08-18 DIAGNOSIS — R921 Mammographic calcification found on diagnostic imaging of breast: Secondary | ICD-10-CM

## 2013-08-20 ENCOUNTER — Other Ambulatory Visit: Payer: Self-pay | Admitting: Internal Medicine

## 2013-08-20 ENCOUNTER — Encounter: Payer: Self-pay | Admitting: Internal Medicine

## 2013-08-20 DIAGNOSIS — N6489 Other specified disorders of breast: Secondary | ICD-10-CM

## 2013-08-20 DIAGNOSIS — D0512 Intraductal carcinoma in situ of left breast: Secondary | ICD-10-CM

## 2013-08-24 ENCOUNTER — Telehealth: Payer: Self-pay | Admitting: *Deleted

## 2013-08-24 DIAGNOSIS — C50512 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: Secondary | ICD-10-CM | POA: Insufficient documentation

## 2013-08-24 NOTE — Telephone Encounter (Signed)
Confirmed [facility] for 09/01/13 at 0800 .  Instructions and contact information given. 

## 2013-08-27 ENCOUNTER — Ambulatory Visit
Admission: RE | Admit: 2013-08-27 | Discharge: 2013-08-27 | Disposition: A | Discharge status: Home / Self Care | Payer: BC Managed Care – PPO | Source: Ambulatory Visit | Attending: Internal Medicine | Admitting: Internal Medicine

## 2013-08-27 DIAGNOSIS — N6489 Other specified disorders of breast: Secondary | ICD-10-CM

## 2013-08-31 ENCOUNTER — Ambulatory Visit
Admission: RE | Admit: 2013-08-31 | Discharge: 2013-08-31 | Disposition: A | Discharge status: Home / Self Care | Payer: BC Managed Care – PPO | Source: Ambulatory Visit | Attending: Internal Medicine | Admitting: Internal Medicine

## 2013-08-31 ENCOUNTER — Other Ambulatory Visit: Payer: Self-pay | Admitting: Internal Medicine

## 2013-08-31 DIAGNOSIS — D0512 Intraductal carcinoma in situ of left breast: Secondary | ICD-10-CM

## 2013-08-31 DIAGNOSIS — R928 Other abnormal and inconclusive findings on diagnostic imaging of breast: Secondary | ICD-10-CM

## 2013-08-31 MED ORDER — GADOBENATE DIMEGLUMINE 529 MG/ML IV SOLN
17.0000 mL | Freq: Once | INTRAVENOUS | Status: AC | PRN
  Administered 2013-08-31: 17 mL via INTRAVENOUS

## 2013-09-01 ENCOUNTER — Ambulatory Visit (HOSPITAL_BASED_OUTPATIENT_CLINIC_OR_DEPARTMENT_OTHER): Payer: BC Managed Care – PPO

## 2013-09-01 ENCOUNTER — Ambulatory Visit (HOSPITAL_BASED_OUTPATIENT_CLINIC_OR_DEPARTMENT_OTHER): Payer: BC Managed Care – PPO | Admitting: Oncology

## 2013-09-01 ENCOUNTER — Encounter: Payer: Self-pay | Admitting: Oncology

## 2013-09-01 ENCOUNTER — Ambulatory Visit (HOSPITAL_BASED_OUTPATIENT_CLINIC_OR_DEPARTMENT_OTHER): Payer: BC Managed Care – PPO | Admitting: General Surgery

## 2013-09-01 ENCOUNTER — Encounter: Payer: Self-pay | Admitting: *Deleted

## 2013-09-01 ENCOUNTER — Ambulatory Visit: Payer: BC Managed Care – PPO | Admitting: Physical Therapy

## 2013-09-01 ENCOUNTER — Other Ambulatory Visit (HOSPITAL_BASED_OUTPATIENT_CLINIC_OR_DEPARTMENT_OTHER): Payer: BC Managed Care – PPO | Admitting: Lab

## 2013-09-01 ENCOUNTER — Ambulatory Visit
Admission: RE | Admit: 2013-09-01 | Discharge: 2013-09-01 | Disposition: A | Discharge status: Home / Self Care | Payer: BC Managed Care – PPO | Source: Ambulatory Visit | Attending: Radiation Oncology | Admitting: Radiation Oncology

## 2013-09-01 ENCOUNTER — Encounter: Payer: Self-pay | Admitting: Internal Medicine

## 2013-09-01 VITALS — BP 122/78 | HR 84 | Temp 97.4°F | Resp 20 | Ht 63.0 in | Wt 182.2 lb

## 2013-09-01 DIAGNOSIS — C50519 Malignant neoplasm of lower-outer quadrant of unspecified female breast: Secondary | ICD-10-CM

## 2013-09-01 DIAGNOSIS — C50512 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: Secondary | ICD-10-CM

## 2013-09-01 DIAGNOSIS — Z170 Estrogen receptor positive status [ER+]: Secondary | ICD-10-CM

## 2013-09-01 DIAGNOSIS — D0590 Unspecified type of carcinoma in situ of unspecified breast: Secondary | ICD-10-CM

## 2013-09-01 DIAGNOSIS — Z803 Family history of malignant neoplasm of breast: Secondary | ICD-10-CM

## 2013-09-01 LAB — CBC WITH DIFFERENTIAL/PLATELET
BASO%: 0.6 % (ref 0.0–2.0)
EOS%: 2.8 % (ref 0.0–7.0)
MCH: 28.1 pg (ref 25.1–34.0)
MCHC: 33 g/dL (ref 31.5–36.0)
MCV: 85.1 fL (ref 79.5–101.0)
MONO%: 6.4 % (ref 0.0–14.0)
NEUT#: 5.4 10*3/uL (ref 1.5–6.5)
RBC: 5.28 10*6/uL (ref 3.70–5.45)
RDW: 12.5 % (ref 11.2–14.5)

## 2013-09-01 LAB — COMPREHENSIVE METABOLIC PANEL (CC13)
AST: 33 U/L (ref 5–34)
Albumin: 4 g/dL (ref 3.5–5.0)
Alkaline Phosphatase: 106 U/L (ref 40–150)
Potassium: 3.5 mEq/L (ref 3.5–5.1)
Sodium: 143 mEq/L (ref 136–145)
Total Protein: 7.6 g/dL (ref 6.4–8.3)

## 2013-09-01 NOTE — Progress Notes (Signed)
Radiation Oncology         [phone]) [phone] ________________________________  Initial outpatient Consultation - Date: 09/01/2013   Name: Katie Becker MRN: [id number]   DOB: 01/13/1951  REFERRING PHYSICIAN: Mariella Saa, MD  DIAGNOSIS: DCIS of the left breast  HISTORY OF PRESENT ILLNESS::Katie Becker is a 62 y.o. female  who presented for six-month followup for calcifications in her left breast. Continue followup was recommended. She elected for biopsy. 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 MRI was performed which showed a re\re 0.5 x 1.9 x 1.6 cm area of non-mass enhancement which was not near the biopsy clips. No contrast enhancement was noted near where the biopsy been performed. In the right breast in the area of linear non-masslike enhancement was noted as well as a nodular area in the right breast which was likely an intramammary lymph node. She has bilateral breast MRI biopsy is pending. She is referred today for my opinion regarding radiation in the management of her disease. She has met with Dr. Dillard Essex. Her MRI guided biopsies are scheduled for November 24. She is accompanied by her 2 daughters. She lost her husband 6 years ago to a glioblastoma. She is GX P2 with her first pregnancy at 20. She is postmenopausal having a hysterectomy in 1989. She was on hormone replacement for 20 years and quit in 2013. Menses at 14. She reports no symptoms prior to her biopsy. No previous history of breast cancer.Marland Kitchen  PREVIOUS RADIATION THERAPY: No  PAST MEDICAL HISTORY:  has a past medical history of DIVERTICULOSIS, COLON (04/20/2007); GERD (04/20/2007); HEPATOMEGALY, HX OF (12/21/2009); HYPERLIPIDEMIA (04/20/2007); HYPERTENSION (04/20/2007); LOW BACK PAIN (08/16/2008); MENOPAUSAL SYNDROME (04/20/2007); PEPTIC ULCER DISEASE (09/06/2010); and Breast cancer.    PAST SURGICAL HISTORY: Past  Surgical History  Procedure Laterality Date  . Cholecystectomy    . Abdominal hysterectomy    . Tubal ligation    . Carpal tunnel release Bilateral     FAMILY HISTORY:  Family History  Problem Relation Age of Onset  . Hypertension Mother   . Hyperlipidemia Mother   . Breast cancer Mother   . Cancer Father     hx of melanoma  . Hypertension Sister   . Hypertension Brother   . Cancer Maternal Grandmother     colon  . Prostate cancer Cousin     SOCIAL HISTORY:  History  Substance Use Topics  . Smoking status: Former Smoker    Quit date: 10/21/1978  . Smokeless tobacco: Never Used  . Alcohol Use: No    ALLERGIES: Penicillins; Erythromycin; Amoxicillin; and Sulfamethoxazole  MEDICATIONS:  Current Outpatient Prescriptions  Medication Sig Dispense Refill  . Calcium Carbonate-Vitamin D (CALCIUM + D PO) Take 1,200 mg by mouth daily.      . hydrochlorothiazide (HYDRODIURIL) 25 MG tablet TAKE 1 TABLET DAILY  90 tablet  3  . LIPITOR 40 MG tablet TAKE 1 TABLET DAILY  90 tablet  3  . Multiple Vitamins-Minerals (WOMENS MULTIVITAMIN PLUS PO) Take 100 mg by mouth daily.      Marland Kitchen NEXIUM 40 MG capsule TAKE 1 CAPSULE DAILY  90 each  3  . pyridOXINE (VITAMIN B-6) 100 MG tablet Take 100 mg by mouth daily.      . vitamin E 600 UNIT capsule Take 800 Units by mouth daily.       No current facility-administered medications for this encounter.    REVIEW  OF SYSTEMS:  A 15 point review of systems is documented in the electronic medical record. This was obtained by the nursing staff. However, I reviewed this with the patient to discuss relevant findings and make appropriate changes.  Pertinent items are noted in HPI.  PHYSICAL EXAM: There were no vitals filed for this visit.. . He is a pleasant female in no distress sitting comfortably examining table. She has no palpable cervical or subclavicular adenopathy. No palpable abnormalities of the either breast. She has some bruising in the inferior  aspect of the left breast. She is alert minus x3. She has 5 out of 5 strength in her upper extremities.  LABORATORY DATA:  Lab Results  Component Value Date   WBC 8.5 09/01/2013   HGB 14.8 09/01/2013   HCT 44.9 09/01/2013   MCV 85.1 09/01/2013   PLT 250 09/01/2013   Lab Results  Component Value Date   NA 143 09/01/2013   K 3.5 09/01/2013   CL 100 09/07/2012   CO2 28 09/01/2013   Lab Results  Component Value Date   ALT 40 09/01/2013   AST 33 09/01/2013   ALKPHOS 106 09/01/2013   BILITOT 0.78 09/01/2013     RADIOGRAPHY: Mr Breast Bilateral W Wo Contrast  08/31/2013   CLINICAL DATA:  Patient with recent diagnosis of left breast DCIS.  EXAM: MR BILATERAL BREAST WITHOUT AND WITH CONTRAST  LABS:  BUN and creatinine were obtained on site at [facility] at  [address]  Results:  BUN 12 mg/dL,  Creatinine 1.0 mg/dL.  TECHNIQUE: Multiplanar, multisequence MR images of both breasts were obtained prior to and following the intravenous administration of 17ml of MultiHance.  THREE-DIMENSIONAL MR IMAGE RENDERING ON INDEPENDENT WORKSTATION:  Three-dimensional MR images were rendered by post-processing of the original MR data on an independent workstation. The three-dimensional MR images were interpreted, and findings are reported in the following complete MRI report for this study.  COMPARISON:  Previous exams  FINDINGS: Breast composition: c:  Heterogeneous fibroglandular tissue  Background parenchymal enhancement: Moderate  Right breast: Within the upper-outer quadrant of the right breast approximately 10-11 o'clock middle depth there is a 1.0 x 0.7 cm oval T2 bright enhancing mass which likely represents an intramammary lymph node. Additionally within the right breast posterior depth 12 o'clock position there is a linear area of non mass enhancement measuring 11 x 6 x 3 mm. Multiple cysts are present within the right breast.  Left breast: Two biopsy marking clips are demonstrated  within the left breast 6 o'clock position. No abnormal surrounding enhancement is demonstrated about the biopsy marking clips. Within the lateral left breast posterior depth there is a 3.5 x 1.9 x 1.6 cm area of non mass enhancement. Multiple cysts are demonstrated within the left breast.  Lymph nodes: No abnormal appearing lymph nodes.  Ancillary findings:  None.  IMPRESSION: 1. Two biopsy marking clips within the 6 o'clock position left breast without surrounding abnormal enhancement. 2. There is a 3.5 cm area of non mass enhancement within the lateral left breast posterior depth. 3. Irregular linear non mass enhancement within the right breast posterior depth. 4. Oval circumscribed T2 bright enhancing mass within the upper-outer quadrant of the right breast, potentially representing a benign intramammary lymph node.  RECOMMENDATION: 1. MRI guided core needle biopsy of the non mass enhancement within the left and right breasts. 2. Second look ultrasound to confirm benign intramammary lymph node within the upper-outer quadrant of the right breast. If  this lymph node demonstrates concerning features on ultrasound, an ultrasound-guided core needle biopsy could be obtained as indicated at the time of the examination. 3. Treatment plan for known left breast DCIS.  BI-RADS CATEGORY  4: Suspicious abnormality - biopsy should be considered.   Electronically Signed   By: Annia Belt M.D.   On: 08/31/2013 11:10   Mm Digital Diagnostic Unilat L  08/10/2013   *RADIOLOGY REPORT*  Clinical Data:  The patient returns for short interval follow-up of probably benign calcifications in the 6 o'clock position of the left breast.  DIGITAL DIAGNOSTIC LEFT MAMMOGRAM WITH CAD  Comparison: Prior studies dating back to 01/19/2004.  Findings:  ACR Breast Density Category c:  The breast tissue is heterogeneously dense, which may obscure small masses.  There is no suspicious dominant mass or architectural distortion to suggest malignancy.   Previously noted calcifications in the 6 o'clock position of the left breast are stable as compared to most recent prior study.  These have slightly increased as compared to 2010.  The patient underwent stereotactic core needle biopsy of similar calcifications at 6 o'clock in the left breast in 2005 with pathologic diagnosis of columnar alteration with prominent apical snouts and secretions without atypia, associated ductal calcifications, and fibroadenomatoid change.  Given that these calcifications are the same shape as the calcifications noted in 2005, it is felt very likely that these are benign and continued short interval follow-up would be reasonable. Findings were discussed with the patient.  The patient is considering retirement soon and requests stereotactic core needle biopsy to avoid prolonged follow-up.  This has been scheduled for 08/18/2013.  Mammographic images were processed with CAD.  IMPRESSION: Stable probably benign calcifications in the left breast.  RECOMMENDATION: Suggest short interval follow-up mammogram of each breast in 6 months.  The patient requests stereotactic core needle biopsy instead of follow-up.  This has been scheduled for 08/18/2013.  I have discussed the findings and recommendations with the patient. Results were also provided in writing at the conclusion of the visit.  If applicable, a reminder letter will be sent to the patient regarding her next appointment.  BI-RADS CATEGORY 3:  Probably benign finding(s) - short interval follow-up suggested.   Original Report Authenticated By: Cain Saupe, M.D.   Mm Digital Diagnostic Unilat R  08/27/2013   CLINICAL DATA:  The patient has had recent diagnosis of breast cancer on the left is stereotactic core biopsy. The patient returns for concurrent mammographic assessment of the right breast prior to MRI evaluation.  EXAM: DIGITAL DIAGNOSTIC  right MAMMOGRAM  COMPARISON:  02/05/2013 and prior  ACR Breast Density Category c: The  breast tissue is heterogeneously dense, which may obscure small masses.  FINDINGS: The right breast as a stable appearance with no new focal dominant mass lesions, worrisome calcifications or areas of architectural distortion identified. No mammographic features of malignancy are seen  IMPRESSION: Stable mammographic appearance to the right breasts with no evidence for malignancy.  RECOMMENDATION: Mammographic followup should the performed based on post therapeutic followup regimen for known left breast cancer.  I have discussed the findings and recommendations with the patient. Results were also provided in writing at the conclusion of the visit. If applicable, a reminder letter will be sent to the patient regarding the next appointment.  BI-RADS CATEGORY  6: Known biopsy-proven malignancy - appropriate action should be taken.   Electronically Signed   By: Leda Gauze M.D.   On: 08/27/2013 08:41   Mm Lt Breast  Bx W Loc Dev 1st Lesion Image Bx Spec Stereo Guide  08/20/2013   ADDENDUM REPORT: 08/20/2013 12:41  ADDENDUM: PATHOLOGY ADDENDUM:  Pathology: Ductal carcinoma in situ  Pathology concordance with imaging findings: Yes  Recommendation: Surgical consultation regarding treatment plan. The patient is scheduled for Multidisciplinary Clinic on 09/01/2013. MRI will be scheduled.  At the request of the patient, I spoke with her by telephone on 08/20/2013 at 12:40. She reports doing well after the biopsy.  The findings and recommendations are called to the office of PETER KWIATKOWSKI .   Electronically Signed   By: Rosalie Gums M.D.   On: 08/20/2013 12:41   08/20/2013   CLINICAL DATA:  Left breast calcifications.  EXAM: STEREOTACTIC CORE NEEDLE BIOPSY  COMPARISON:  Previous exams.  FINDINGS: I met with the patient and we discussed the procedure of stereotactic-guided biopsy, including benefits and alternatives. We discussed the high likelihood of a successful procedure. We discussed the risks of the procedure,  including infection, bleeding, tissue injury, clip migration, and inadequate sampling. Informed, written consent was given.  Using sterile technique and 2% Lidocaine as local anesthetic, under stereotactic guidance, a 9 gauge vacuum assisted device was used to perform core needle biopsy of calcifications in the 6 o'clock region of the left breast using an inferior approach. Specimen radiograph was performed, showing calcifications are present in the tissue sample. Specimens with calcifications are identified for pathology.  At the conclusion of the procedure, a dumbbell-shaped tissue marker clip was deployed into the biopsy cavity. Follow-up 2-view mammogram confirmed clip.  IMPRESSION: Stereotactic-guided biopsy of the left breast. No apparent complications.  Electronically Signed: By: Baird Lyons M.D. On: 08/18/2013 10:08   IMPRESSION: DCIS of the left breast with MRI biopsies pending  PLAN: I discussed with Ms. Livolsi that there is still a lot up in the air regarding her to stage. She has MRI biopsy is pending as well as the further delineation of this area in her right breast which is an intramammary lymph node or the suspicious for an intramammary lymph node. If she ultimately and 79 this area of DCIS that has been biopsied on the left breast she would require her post operative radiation. We discussed the role of radiation and decreasing local failures in patients who undergo lumpectomy. We discussed the process of simulation the placement tattoos. We discussed the possibility of skin redness and fatigue. We discussed decreased cosmetic outcome and fibrosis. We discussed 4-6 weeks of treatment as an outpatient. We discussed the possibility of a symptomatic lung damage as well as low likelihood of secondary malignancies. I will plan on following up with her after her surgery. She has met with surgery medical oncology as well as her patient and family support team in her breast cancer navigator.  I spent 40  minutes  face to face with the patient and more than 50% of that time was spent in counseling and/or coordination of care.   ------------------------------------------------  Lurline Hare, MD

## 2013-09-01 NOTE — Progress Notes (Signed)
She has her [facility] forms and appt card. Checked in new patient with no financial issues.

## 2013-09-01 NOTE — Progress Notes (Signed)
Chief Complaint: New diagnosis of breast cancer  History:    Katie Becker is a 62 y.o. postmenopausal female referred by Dr. Kyung Rudd  for evaluation of recently diagnosed carcinoma of the left breast. She recently presented for a screening mamogram revealing a 2.8 cm area of microcalcifications at the 6:00 position of the left breast which had increased slightly from her previous six-month mammogram.   A stereotactic biopsy was performed on 08/18/2013 with pathology revealing ductal carcinoma in-situ of the breast.  Subsequent bilateral breast MRI was performed with multiple findings. 2 biopsy clips marking the known area of DCIS at the 6:00 position of the left breast were noted without any surrounding abnormal enhancement. Also in the left breast was noted a 3.5 cm area of non-mass enhancement laterally and posteriorly. In the right breast was a smaller irregular linear non-mass area of enhancement posteriorly. Also in the right breast was a enhancing mass in the upper outer quadrant possibly consistent with an intramammary lymph node. The patient has been scheduled for MRI guided core needle biopsies of the non-mass enhancement in the left and right breasts and second look ultrasound to confirm possible intramammary lymph node in the upper outer quadrant of the right breast. She is seen now in multidisciplinary clinic for initial treatment planning.  She has experienced no breast symptoms, specifically no mass or pain or nipple discharge or skin changes..  She does not have a personal history of any previous breast problems.  Findings at that time were the following:  Tumor size: 2.8 cm  Tumor grade: 2  Estrogen Receptor: positive Progesterone Receptor: negative  Her-2 neu: not performed  Lymph node status: negative   Past Medical History  Diagnosis Date  . DIVERTICULOSIS, COLON 04/20/2007  . GERD 04/20/2007  . HEPATOMEGALY, HX OF 12/21/2009  . HYPERLIPIDEMIA 04/20/2007  . HYPERTENSION 04/20/2007   . LOW BACK PAIN 08/16/2008  . MENOPAUSAL SYNDROME 04/20/2007  . PEPTIC ULCER DISEASE 09/06/2010  . Breast cancer     Past Surgical History  Procedure Laterality Date  . Cholecystectomy    . Abdominal hysterectomy    . Tubal ligation    . Carpal tunnel release Bilateral     Current Outpatient Prescriptions  Medication Sig Dispense Refill  . Calcium Carbonate-Vitamin D (CALCIUM + D PO) Take 1,200 mg by mouth daily.      . hydrochlorothiazide (HYDRODIURIL) 25 MG tablet TAKE 1 TABLET DAILY  90 tablet  3  . LIPITOR 40 MG tablet TAKE 1 TABLET DAILY  90 tablet  3  . Multiple Vitamins-Minerals (WOMENS MULTIVITAMIN PLUS PO) Take 100 mg by mouth daily.      Marland Kitchen NEXIUM 40 MG capsule TAKE 1 CAPSULE DAILY  90 each  3  . pyridOXINE (VITAMIN B-6) 100 MG tablet Take 100 mg by mouth daily.      . vitamin E 600 UNIT capsule Take 800 Units by mouth daily.       No current facility-administered medications for this visit.    Family History  Problem Relation Age of Onset  . Hypertension Mother   . Hyperlipidemia Mother   . Breast cancer Mother   . Cancer Father     hx of melanoma  . Hypertension Sister   . Hypertension Brother   . Cancer Maternal Grandmother     colon  . Prostate cancer Cousin     History   Social History  . Marital Status: Married    Spouse Name: N/A    Number  of Children: N/A  . Years of Education: N/A   Social History Main Topics  . Smoking status: Former Smoker    Quit date: 10/21/1978  . Smokeless tobacco: Never Used  . Alcohol Use: No  . Drug Use: No  . Sexual Activity: Not on file   Other Topics Concern  . Not on file   Social History Narrative  . No narrative on file     Review of Systems Constitutional: negative Respiratory: negative Cardiovascular: negative Gastrointestinal: positive for reflux symptoms     Objective:  There were no vitals taken for this visit.  General: Alert, well-developed Caucasian female, in no distress Skin: Warm  and dry without rash or infection. HEENT: No palpable masses or thyromegaly. Sclera nonicteric. Pupils equal round and reactive. Oropharynx clear. Breasts: slight bruising in the inferior left breast at the biopsy site. No palpable masses in either breast. No palpable axillary adenopathy. Lymph nodes: No cervical, supraclavicular, or inguinal nodes palpable. Lungs: Breath sounds clear and equal without increased work of breathing Cardiovascular: Regular rate and rhythm without murmur. No JVD or edema. Peripheral pulses intact. Abdomen: Nondistended. Soft and nontender. No masses palpable. No organomegaly. No palpable hernias. Extremities: No edema or joint swelling or deformity. No chronic venous stasis changes. Neurologic: Alert and fully oriented. Gait normal.   Laboratory data:  CBC:  Lab Results  Component Value Date   WBC 8.5 09/01/2013   WBC 7.3 09/07/2012   RBC 5.28 09/01/2013   RBC 4.90 09/07/2012   HGB 14.8 09/01/2013   HGB 14.4 09/07/2012   HCT 44.9 09/01/2013   HCT 43.3 09/07/2012   PLT 250 09/01/2013   PLT 210.0 09/07/2012  ]  [facility] Labs:  Lab Results  Component Value Date   NA 143 09/01/2013   NA 138 09/07/2012   K 3.5 09/01/2013   K 3.2* 09/07/2012   CL 100 09/07/2012   CO2 28 09/01/2013   CO2 28 09/07/2012   BUN 13.1 09/01/2013   BUN 18 09/07/2012   CREATININE 0.9 09/01/2013   CREATININE 0.9 09/07/2012   CALCIUM 10.0 09/01/2013   CALCIUM 9.0 09/07/2012   PROT 7.6 09/01/2013   PROT 7.2 09/07/2012   BILITOT 0.78 09/01/2013   BILITOT 0.8 09/07/2012   BILIDIR 0.2 09/07/2012   ALKPHOS 106 09/01/2013   ALKPHOS 74 09/07/2012   AST 33 09/01/2013   AST 98* 09/07/2012   ALT 40 09/01/2013   ALT 82* 09/07/2012     Assessment  62 y.o. female with a new diagnosis of cancer of the the left breast lower outer quadrant.  Clinical 0, estrogen receptor positive. I discussed with the patient and family members present today initial surgical treatment options. We  discussed options of breast conservation with lumpectomy or total mastectomy and sentinal lymph node biopsy/dissection. Options for reconstruction were discussed. After discussion they have elected to proceed with left partial mastectomy. However, there are multiple abnormalities on MRI that will require further investigation prior to completing the treatment plan and they understand this. We discussed the indications and nature of the procedure, and expected recovery, in detail. Surgical risks including anesthetic complications, cardiorespiratory complications, bleeding, infection, wound healing complications, blood clots, lymphedema, local and distant recurrence and possible need for further surgery based on the final pathology was discussed and understood.  Chemotherapy, hormonal therapy and radiation therapy have been discussed. They have been provided with literature regarding the treatment of breast cancer.  All questions were answered. They understand and agree to proceed and  we will go ahead with scheduling.  Plan left partial mastectomy pending results of her MR biopsies. The patient will be seen back in the office following these investigations to confirm her treatment plan.  Mariella Saa MD, FACS  09/01/2013, 9:52 AM

## 2013-09-01 NOTE — Progress Notes (Signed)
Katie Becker [id number] [birth year] 62 y.o. 09/01/2013 12:48 PM  CC  Rogelia Boga, MD [address] [place] [address] Dr. Glenna Fellows Dr. Lurline Hare  REASON FOR CONSULTATION:  62 year-old female with new diagnosis of DCIS of the left breast. Patient is seen in the multidisciplinary breast clinic for discussion of treatment options  STAGE:   Breast cancer of lower-outer quadrant of left female breast   Primary site: Breast (Left)   Staging method: AJCC 7th Edition   Clinical: Stage 0 (Tis (DCIS), N0, cM0)   Summary: Stage 0 (Tis (DCIS), N0, cM0)  REFERRING PHYSICIAN: Dr. Sharlet Salina Hoxworth  HISTORY OF PRESENT ILLNESS:  Katie Becker is a 62 y.o. female.  With multiple medical problems who underwent a six-month followup screening mammogram and was found to have left breast calcifications at the 6:00 position measuring 2.8 cm. She had a biopsy performed that showed DCIS that was ER positive PR negative. She went on to have a mammogram performed of bilateral breasts. Reading of which is very complex. However she was found to have in the right breast within the upper-outer quadrant 10:00 to 11:00 middle.1.0 x 0.7 cm oval T. to the right enhancing mass representing an intramammary lymph node. Additionally within the right breast posterior depth the 12:00 position there was a linear area of enhancement measuring 11 mmx3 mm. multiple cysts were present within the right breast. In the left breast biopsy marking clips demonstrated within the left breast at the 6:00 position no abnormal surrounding enhancement was demonstrated within the lateral left breast posterior to it was a 3.5 x 1.9 x 1.6 cm area of non-mass enhancement. Multiple cysts demonstrated within the left breast. Lymph nodes no abnormal appearance. Patient has not had biopsies of these areas yet. But they are being scheduled. Patient herself is without any complaints. She is accompanied by HER-2  daughters. Her case was discussed at the multidisciplinary breast conference. Her radiology and pathology were reviewed. She is now presenting clinic of treatment options.   Past Medical History: Past Medical History  Diagnosis Date  . DIVERTICULOSIS, COLON 04/20/2007  . GERD 04/20/2007  . HEPATOMEGALY, HX OF 12/21/2009  . HYPERLIPIDEMIA 04/20/2007  . HYPERTENSION 04/20/2007  . LOW BACK PAIN 08/16/2008  . MENOPAUSAL SYNDROME 04/20/2007  . PEPTIC ULCER DISEASE 09/06/2010  . Breast cancer     Past Surgical History: Past Surgical History  Procedure Laterality Date  . Cholecystectomy    . Abdominal hysterectomy    . Tubal ligation    . Carpal tunnel release Bilateral     Family History: Family History  Problem Relation Age of Onset  . Hypertension Mother   . Hyperlipidemia Mother   . Breast cancer Mother   . Cancer Father     hx of melanoma  . Hypertension Sister   . Hypertension Brother   . Cancer Maternal Grandmother     colon  . Prostate cancer Cousin     Social History History  Substance Use Topics  . Smoking status: Former Smoker    Quit date: 10/21/1978  . Smokeless tobacco: Never Used  . Alcohol Use: No    Allergies: Allergies  Allergen Reactions  . Penicillins Anaphylaxis  . Erythromycin Nausea And Vomiting  . Amoxicillin     REACTION: unspecified  . Sulfamethoxazole     REACTION: unspecified    Current Medications: Current Outpatient Prescriptions  Medication Sig Dispense Refill  . Calcium Carbonate-Vitamin D (CALCIUM + D PO) Take 1,200 mg  by mouth daily.      . Multiple Vitamins-Minerals (WOMENS MULTIVITAMIN PLUS PO) Take 100 mg by mouth daily.      Marland Kitchen pyridOXINE (VITAMIN B-6) 100 MG tablet Take 100 mg by mouth daily.      . vitamin E 600 UNIT capsule Take 800 Units by mouth daily.      . hydrochlorothiazide (HYDRODIURIL) 25 MG tablet TAKE 1 TABLET DAILY  90 tablet  3  . LIPITOR 40 MG tablet TAKE 1 TABLET DAILY  90 tablet  3  . NEXIUM 40 MG capsule  TAKE 1 CAPSULE DAILY  90 each  3   No current facility-administered medications for this visit.    OB/GYN History: Menarche at age 64 she is postmenopausal first live birth at 43 she did take hormone replacement but is now off.  Fertility Discussion: not applicable Prior History of Cancer: no  Health Maintenance:  Colonoscopy unknown  Bone Density unknown  Last PAP smear unknown  ECOG PERFORMANCE STATUS: 0 - Asymptomatic  Genetic Counseling/testing: yes  REVIEW OF SYSTEMS:  A comprehensive review of systems was negative.  PHYSICAL EXAMINATION: Blood pressure 122/78, pulse 84, temperature 97.4 F (36.3 C), temperature source Oral, resp. rate 20, height 5\' 3"  (1.6 m), weight 182 lb 3.2 oz (82.645 kg).  ZOX:WRUEA, healthy, no distress, well nourished and well developed SKIN: skin color, texture, turgor are normal HEAD: Normocephalic, No masses, lesions, tenderness or abnormalities EYES: PERRLA, EOMI, Conjunctiva are pink and non-injected EARS: External ears normal OROPHARYNX:no exudate, no erythema and lips, buccal mucosa, and tongue normal  NECK: supple, no adenopathy LYMPH:  no palpable lymphadenopathy, no hepatosplenomegaly BREAST:breasts appear normal, no suspicious masses, no skin or nipple changes or axillary nodes LUNGS: clear to auscultation and percussion HEART: regular rate & rhythm, no murmurs and no gallops ABDOMEN:abdomen soft, non-tender, normal bowel sounds and no masses or organomegaly BACK: Back symmetric, no curvature., No CVA tenderness EXTREMITIES:no edema, no clubbing, no cyanosis  NEURO: alert & oriented x 3 with fluent speech, no focal motor/sensory deficits, gait normal, reflexes normal and symmetric     STUDIES/RESULTS: Mr Breast Bilateral W Wo Contrast  08/31/2013   CLINICAL DATA:  Patient with recent diagnosis of left breast DCIS.  EXAM: MR BILATERAL BREAST WITHOUT AND WITH CONTRAST  LABS:  BUN and creatinine were obtained on site at  [facility] at  [address]  Results:  BUN 12 mg/dL,  Creatinine 1.0 mg/dL.  TECHNIQUE: Multiplanar, multisequence MR images of both breasts were obtained prior to and following the intravenous administration of 17ml of MultiHance.  THREE-DIMENSIONAL MR IMAGE RENDERING ON INDEPENDENT WORKSTATION:  Three-dimensional MR images were rendered by post-processing of the original MR data on an independent workstation. The three-dimensional MR images were interpreted, and findings are reported in the following complete MRI report for this study.  COMPARISON:  Previous exams  FINDINGS: Breast composition: c:  Heterogeneous fibroglandular tissue  Background parenchymal enhancement: Moderate  Right breast: Within the upper-outer quadrant of the right breast approximately 10-11 o'clock middle depth there is a 1.0 x 0.7 cm oval T2 bright enhancing mass which likely represents an intramammary lymph node. Additionally within the right breast posterior depth 12 o'clock position there is a linear area of non mass enhancement measuring 11 x 6 x 3 mm. Multiple cysts are present within the right breast.  Left breast: Two biopsy marking clips are demonstrated within the left breast 6 o'clock position. No abnormal surrounding enhancement is demonstrated about the biopsy  marking clips. Within the lateral left breast posterior depth there is a 3.5 x 1.9 x 1.6 cm area of non mass enhancement. Multiple cysts are demonstrated within the left breast.  Lymph nodes: No abnormal appearing lymph nodes.  Ancillary findings:  None.  IMPRESSION: 1. Two biopsy marking clips within the 6 o'clock position left breast without surrounding abnormal enhancement. 2. There is a 3.5 cm area of non mass enhancement within the lateral left breast posterior depth. 3. Irregular linear non mass enhancement within the right breast posterior depth. 4. Oval circumscribed T2 bright enhancing mass within the upper-outer quadrant of the right breast,  potentially representing a benign intramammary lymph node.  RECOMMENDATION: 1. MRI guided core needle biopsy of the non mass enhancement within the left and right breasts. 2. Second look ultrasound to confirm benign intramammary lymph node within the upper-outer quadrant of the right breast. If this lymph node demonstrates concerning features on ultrasound, an ultrasound-guided core needle biopsy could be obtained as indicated at the time of the examination. 3. Treatment plan for known left breast DCIS.  BI-RADS CATEGORY  4: Suspicious abnormality - biopsy should be considered.   Electronically Signed   By: Annia Belt M.D.   On: 08/31/2013 11:10   Mm Digital Diagnostic Unilat L  08/10/2013   *RADIOLOGY REPORT*  Clinical Data:  The patient returns for short interval follow-up of probably benign calcifications in the 6 o'clock position of the left breast.  DIGITAL DIAGNOSTIC LEFT MAMMOGRAM WITH CAD  Comparison: Prior studies dating back to 01/19/2004.  Findings:  ACR Breast Density Category c:  The breast tissue is heterogeneously dense, which may obscure small masses.  There is no suspicious dominant mass or architectural distortion to suggest malignancy.  Previously noted calcifications in the 6 o'clock position of the left breast are stable as compared to most recent prior study.  These have slightly increased as compared to 2010.  The patient underwent stereotactic core needle biopsy of similar calcifications at 6 o'clock in the left breast in 2005 with pathologic diagnosis of columnar alteration with prominent apical snouts and secretions without atypia, associated ductal calcifications, and fibroadenomatoid change.  Given that these calcifications are the same shape as the calcifications noted in 2005, it is felt very likely that these are benign and continued short interval follow-up would be reasonable. Findings were discussed with the patient.  The patient is considering retirement soon and requests  stereotactic core needle biopsy to avoid prolonged follow-up.  This has been scheduled for 08/18/2013.  Mammographic images were processed with CAD.  IMPRESSION: Stable probably benign calcifications in the left breast.  RECOMMENDATION: Suggest short interval follow-up mammogram of each breast in 6 months.  The patient requests stereotactic core needle biopsy instead of follow-up.  This has been scheduled for 08/18/2013.  I have discussed the findings and recommendations with the patient. Results were also provided in writing at the conclusion of the visit.  If applicable, a reminder letter will be sent to the patient regarding her next appointment.  BI-RADS CATEGORY 3:  Probably benign finding(s) - short interval follow-up suggested.   Original Report Authenticated By: Cain Saupe, M.D.   Mm Digital Diagnostic Unilat R  08/27/2013   CLINICAL DATA:  The patient has had recent diagnosis of breast cancer on the left is stereotactic core biopsy. The patient returns for concurrent mammographic assessment of the right breast prior to MRI evaluation.  EXAM: DIGITAL DIAGNOSTIC  right MAMMOGRAM  COMPARISON:  02/05/2013 and prior  ACR Breast Density Category c: The breast tissue is heterogeneously dense, which may obscure small masses.  FINDINGS: The right breast as a stable appearance with no new focal dominant mass lesions, worrisome calcifications or areas of architectural distortion identified. No mammographic features of malignancy are seen  IMPRESSION: Stable mammographic appearance to the right breasts with no evidence for malignancy.  RECOMMENDATION: Mammographic followup should the performed based on post therapeutic followup regimen for known left breast cancer.  I have discussed the findings and recommendations with the patient. Results were also provided in writing at the conclusion of the visit. If applicable, a reminder letter will be sent to the patient regarding the next appointment.  BI-RADS CATEGORY   6: Known biopsy-proven malignancy - appropriate action should be taken.   Electronically Signed   By: Leda Gauze M.D.   On: 08/27/2013 08:41   Mm Lt Breast Bx W Loc Dev 1st Lesion Image Bx Spec Stereo Guide  08/20/2013   ADDENDUM REPORT: 08/20/2013 12:41  ADDENDUM: PATHOLOGY ADDENDUM:  Pathology: Ductal carcinoma in situ  Pathology concordance with imaging findings: Yes  Recommendation: Surgical consultation regarding treatment plan. The patient is scheduled for Multidisciplinary Clinic on 09/01/2013. MRI will be scheduled.  At the request of the patient, I spoke with her by telephone on 08/20/2013 at 12:40. She reports doing well after the biopsy.  The findings and recommendations are called to the office of PETER KWIATKOWSKI .   Electronically Signed   By: Rosalie Gums M.D.   On: 08/20/2013 12:41   08/20/2013   CLINICAL DATA:  Left breast calcifications.  EXAM: STEREOTACTIC CORE NEEDLE BIOPSY  COMPARISON:  Previous exams.  FINDINGS: I met with the patient and we discussed the procedure of stereotactic-guided biopsy, including benefits and alternatives. We discussed the high likelihood of a successful procedure. We discussed the risks of the procedure, including infection, bleeding, tissue injury, clip migration, and inadequate sampling. Informed, written consent was given.  Using sterile technique and 2% Lidocaine as local anesthetic, under stereotactic guidance, a 9 gauge vacuum assisted device was used to perform core needle biopsy of calcifications in the 6 o'clock region of the left breast using an inferior approach. Specimen radiograph was performed, showing calcifications are present in the tissue sample. Specimens with calcifications are identified for pathology.  At the conclusion of the procedure, a dumbbell-shaped tissue marker clip was deployed into the biopsy cavity. Follow-up 2-view mammogram confirmed clip.  IMPRESSION: Stereotactic-guided biopsy of the left breast. No apparent  complications.  Electronically Signed: By: Baird Lyons M.D. On: 08/18/2013 10:08     LABS:    Chemistry      Component Value Date/Time   NA 143 09/01/2013 0803   NA 138 09/07/2012 0908   K 3.5 09/01/2013 0803   K 3.2* 09/07/2012 0908   CL 100 09/07/2012 0908   CO2 28 09/01/2013 0803   CO2 28 09/07/2012 0908   BUN 13.1 09/01/2013 0803   BUN 18 09/07/2012 0908   CREATININE 0.9 09/01/2013 0803   CREATININE 0.9 09/07/2012 0908      Component Value Date/Time   CALCIUM 10.0 09/01/2013 0803   CALCIUM 9.0 09/07/2012 0908   ALKPHOS 106 09/01/2013 0803   ALKPHOS 74 09/07/2012 0908   AST 33 09/01/2013 0803   AST 98* 09/07/2012 0908   ALT 40 09/01/2013 0803   ALT 82* 09/07/2012 0908   BILITOT 0.78 09/01/2013 0803   BILITOT 0.8 09/07/2012 0908      Lab Results  Component Value Date   WBC 8.5 09/01/2013   HGB 14.8 09/01/2013   HCT 44.9 09/01/2013   MCV 85.1 09/01/2013   PLT 250 09/01/2013   PATHOLOGY:  ADDITIONAL INFORMATION: PROGNOSTIC INDICATORS - ACIS Results: IMMUNOHISTOCHEMICAL AND MORPHOMETRIC ANALYSIS BY THE AUTOMATED CELLULAR IMAGING SYSTEM (ACIS) Estrogen Receptor: 100%, POSITIVE, STRONG STAINING INTENSITY Progesterone Receptor: 0, NEGATIVE COMMENT: The negative hormone receptor study(ies) in this case has an internal positive control. REFERENCE RANGE ESTROGEN RECEPTOR NEGATIVE <1% POSITIVE =>1% PROGESTERONE RECEPTOR NEGATIVE <1% POSITIVE =>1% All controls stained appropriately Abigail Miyamoto MD Pathologist, Electronic Signature ( Signed 08/25/2013) FINAL DIAGNOSIS Diagnosis Breast, left, needle core biopsy, 6 o'clock - DUCTAL CARCINOMA IN SITU WITH CALCIFICATIONS, SEE COMMENT. 1 of 2 FINAL for Pellegrini, Arwyn E [id number]) Diagnosis Note The ductal carcinoma in situ appears low to intermediate grade. E-cadherin reveals positive staining within the atypical cells, while cytokeratin 5/6 is negative. Breast prognostic markers will be ordered and  reported separately. Dr. Raynald Blend has reviewed the case. The case was called to [facility] on 08/19/2013. Valinda Hoar MD Pathologist, Electronic Signature (Case signed 08/20/2013) Specimen Gross and Clinical Information Specimen Comment Calcifications Specimen(s) Obtained: Breast, left, needle core biopsy, 6 o'clock Specimen Clinical  ASSESSMENT    62 year old female with  #1 new diagnosis of DCIS, left breast at the 6:00 position. Tumor is ER positive PR negative. Patient's MRI revealed other areas of concern in both the right and the left breast as noted above in the history of present illness. We discussed MRI findings today. Patient understands that she will need biopsies of the other areas. If all of those areas are negative then certainly she will have a stage 0 disease and she would be a good candidate for a lumpectomy.   #2 We spent the better part of today's hour-long appointment discussing the biology of breast cancer in general, and the specifics of the patient's tumor in particular. We discussed pathophysiology of DCIS, we discussed treatment options for DCIS including lumpectomy, surgery, and adjuvant antiestrogen therapy with tamoxifen. We discussed side effects and benefits of antiestrogen therapy in the setting of DCIS. She would be a good candidate for tamoxifen 20 mg daily for a total of 5 years.  #3 due to patient's family history I would commend that she have genetic counseling and testing consultation    Clinical Trial Eligibility: no Multidisciplinary conference discussion yes     PLAN:    #1 patient will proceed with biopsies of both the left and the right breast.  #2 once results are known she will proceed with appropriate surgery.  #3 once patient has had her surgery I will see her back for followup and discuss further adjuvant therapies.  #4 review of patient's family history I do think she is a good candidate for genetic counseling  and testing       Discussion: Patient is being treated per NCCN breast cancer care guidelines appropriate for stage.0   Thank you so much for allowing me to participate in the care of MARYFRANCES PORTUGAL. I will continue to follow up the patient with you and assist in her care.  All questions were answered. The patient knows to call the clinic with any problems, questions or concerns. We can certainly see the patient much sooner if necessary.  I spent 40 minutes counseling the patient face to face. The total time spent in the appointment was 60 minutes.  Drue Second, MD Medical/Oncology [facility] [phone]  beeper) [phone] (Office)  09/01/2013, 12:48 PM

## 2013-09-02 ENCOUNTER — Encounter: Payer: Self-pay | Admitting: *Deleted

## 2013-09-02 ENCOUNTER — Telehealth: Payer: Self-pay | Admitting: Oncology

## 2013-09-02 ENCOUNTER — Ambulatory Visit
Admission: RE | Admit: 2013-09-02 | Discharge: 2013-09-02 | Disposition: A | Discharge status: Home / Self Care | Payer: BC Managed Care – PPO | Source: Ambulatory Visit | Attending: Internal Medicine | Admitting: Internal Medicine

## 2013-09-02 DIAGNOSIS — R928 Other abnormal and inconclusive findings on diagnostic imaging of breast: Secondary | ICD-10-CM

## 2013-09-02 NOTE — Progress Notes (Signed)
Faxed Care Plan to Leigh at [facility].  Took copy to Med Rec to scan.

## 2013-09-02 NOTE — Progress Notes (Signed)
Faxed Care Plan to PCP. 

## 2013-09-06 ENCOUNTER — Telehealth: Payer: Self-pay | Admitting: *Deleted

## 2013-09-06 NOTE — Telephone Encounter (Signed)
Spoke to pt concerning [facility] from 09/01/13.  Pt denies questions or concerns regarding dx or treatment care plan.  Confirmed future appts and breast MRI.  Encourage pt to call with needs.  Received verbal understanding.  Contact information given.

## 2013-09-08 ENCOUNTER — Encounter: Payer: Self-pay | Admitting: *Deleted

## 2013-09-08 NOTE — Progress Notes (Signed)
[facility] Psychosocial Distress Screening Clinical Social Work  Patient completed distress screening protocol, and scored a 4 on the Psychosocial Distress Thermometer which indicates mild distress. Clinical Social Worker met with pt in [facility] on 09/01/13 to assess for distress and other psychosocial needs.  Pt stated she was feeling "much better" after speaking with physicians and getting information on her treatment plan.  CSW informed pt of the support team and support services at [facility].  CSW encouraged pt to call with any questions or concerns.      Tamala Julian, MSW, LCSW Clinical Social Worker [facility] [phone]

## 2013-09-09 ENCOUNTER — Encounter: Payer: Self-pay | Admitting: *Deleted

## 2013-09-09 NOTE — Progress Notes (Signed)
Mailed after appt letter to pt. 

## 2013-09-10 ENCOUNTER — Other Ambulatory Visit: Payer: BC Managed Care – PPO

## 2013-09-13 ENCOUNTER — Ambulatory Visit
Admission: RE | Admit: 2013-09-13 | Discharge: 2013-09-13 | Disposition: A | Discharge status: Home / Self Care | Payer: BC Managed Care – PPO | Source: Ambulatory Visit | Attending: Internal Medicine | Admitting: Internal Medicine

## 2013-09-13 DIAGNOSIS — R928 Other abnormal and inconclusive findings on diagnostic imaging of breast: Secondary | ICD-10-CM

## 2013-09-13 MED ORDER — GADOBENATE DIMEGLUMINE 529 MG/ML IV SOLN
17.0000 mL | Freq: Once | INTRAVENOUS | Status: AC | PRN
  Administered 2013-09-13: 17 mL via INTRAVENOUS

## 2013-09-15 ENCOUNTER — Encounter (INDEPENDENT_AMBULATORY_CARE_PROVIDER_SITE_OTHER): Payer: Self-pay | Admitting: General Surgery

## 2013-09-15 ENCOUNTER — Ambulatory Visit (INDEPENDENT_AMBULATORY_CARE_PROVIDER_SITE_OTHER): Payer: BC Managed Care – PPO | Admitting: General Surgery

## 2013-09-15 VITALS — BP 138/78 | HR 96 | Temp 98.6°F | Resp 14 | Ht 62.0 in | Wt 180.2 lb

## 2013-09-15 DIAGNOSIS — L918 Other hypertrophic disorders of the skin: Secondary | ICD-10-CM | POA: Insufficient documentation

## 2013-09-15 DIAGNOSIS — L909 Atrophic disorder of skin, unspecified: Secondary | ICD-10-CM

## 2013-09-15 DIAGNOSIS — C50519 Malignant neoplasm of lower-outer quadrant of unspecified female breast: Secondary | ICD-10-CM

## 2013-09-15 DIAGNOSIS — C50512 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: Secondary | ICD-10-CM

## 2013-09-15 NOTE — Progress Notes (Signed)
Chief complaint: Followup DCIS left breast  History: Patient returns for further preoperative discussion and treatment planning for ductal carcinoma in situ left breast. Please see my previous dictation from earlier this month for details of her current illness. She has an approximately 3.5 x 1.5 cm area of biopsy proven carcinoma in situ at about the 6:00 position of the left breast. In addition she had bilateral abnormalities noted on MR and since her last visit she has had bilateral MR guided biopsies. These both revealed benign fibrocystic disease. We discussed the results of this study. The patient is now ready to proceed with scheduling for left breast lumpectomy as we had previously discussed. The patient also today is complaining of multiple axillary skin tags that are intermittently irrigated and inflamed and is asking these can be removed at the time of her lumpectomy.  Past Medical History  Diagnosis Date  . DIVERTICULOSIS, COLON 04/20/2007  . GERD 04/20/2007  . HEPATOMEGALY, HX OF 12/21/2009  . HYPERLIPIDEMIA 04/20/2007  . HYPERTENSION 04/20/2007  . LOW BACK PAIN 08/16/2008  . MENOPAUSAL SYNDROME 04/20/2007  . PEPTIC ULCER DISEASE 09/06/2010  . Breast cancer    Past Surgical History  Procedure Laterality Date  . Cholecystectomy    . Abdominal hysterectomy    . Tubal ligation    . Carpal tunnel release Bilateral    Current Outpatient Prescriptions  Medication Sig Dispense Refill  . Calcium Carbonate-Vitamin D (CALCIUM + D PO) Take 1,200 mg by mouth daily.      . hydrochlorothiazide (HYDRODIURIL) 25 MG tablet TAKE 1 TABLET DAILY  90 tablet  3  . LIPITOR 40 MG tablet TAKE 1 TABLET DAILY  90 tablet  3  . Multiple Vitamins-Minerals (WOMENS MULTIVITAMIN PLUS PO) Take 100 mg by mouth daily.      . NEXIUM 40 MG capsule TAKE 1 CAPSULE DAILY  90 each  3  . pyridOXINE (VITAMIN B-6) 100 MG tablet Take 100 mg by mouth daily.       No current facility-administered medications for this visit.    Allergies  Allergen Reactions  . Penicillins Anaphylaxis  . Erythromycin Nausea And Vomiting  . Amoxicillin     REACTION: unspecified  . Sulfamethoxazole     REACTION: unspecified   Exam: BP 138/78  Pulse 96  Temp(Src) 98.6 F (37 C) (Temporal)  Resp 14  Ht 5' 2" (1.575 m)  Wt 180 lb 3.2 oz (81.738 kg)  BMI 32.95 kg/m2 General: Mild overweight appears well Skin: There are multiple probably 8 or 10 on either side axillary skin tags Breasts: Healing core biopsy sites bilaterally. No palpable masses. 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   #2 multiple axillary bilateral skin tags. We will plan to proceed with needle localized left breast lumpectomy under general anesthesia as an outpatient as well as excision of bilateral axillary skin tags. We again discussed the nature of the procedure and recovery as well as risks of anesthetic complications, wound healing, bleeding, infection and possible need for further surgery based on the final margin assessment. All her questions were answered. 

## 2013-09-17 ENCOUNTER — Encounter: Payer: BC Managed Care – PPO | Admitting: Internal Medicine

## 2013-09-21 ENCOUNTER — Ambulatory Visit (INDEPENDENT_AMBULATORY_CARE_PROVIDER_SITE_OTHER): Payer: BC Managed Care – PPO | Admitting: Internal Medicine

## 2013-09-21 ENCOUNTER — Encounter: Payer: Self-pay | Admitting: Internal Medicine

## 2013-09-21 ENCOUNTER — Other Ambulatory Visit: Payer: Self-pay | Admitting: *Deleted

## 2013-09-21 VITALS — BP 142/90 | HR 78 | Temp 97.9°F | Resp 20 | Ht 63.0 in | Wt 184.0 lb

## 2013-09-21 DIAGNOSIS — C50519 Malignant neoplasm of lower-outer quadrant of unspecified female breast: Secondary | ICD-10-CM

## 2013-09-21 DIAGNOSIS — C50512 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: Secondary | ICD-10-CM

## 2013-09-21 DIAGNOSIS — Z0000 Encounter for general adult medical examination without abnormal findings: Secondary | ICD-10-CM

## 2013-09-21 DIAGNOSIS — I10 Essential (primary) hypertension: Secondary | ICD-10-CM

## 2013-09-21 DIAGNOSIS — E785 Hyperlipidemia, unspecified: Secondary | ICD-10-CM

## 2013-09-21 MED ORDER — ESOMEPRAZOLE MAGNESIUM 40 MG PO CPDR: DELAYED_RELEASE_CAPSULE | ORAL | Status: DC

## 2013-09-21 MED ORDER — ATORVASTATIN CALCIUM 40 MG PO TABS: ORAL_TABLET | ORAL | Status: DC

## 2013-09-21 MED ORDER — HYDROCHLOROTHIAZIDE 25 MG PO TABS: ORAL_TABLET | ORAL | Status: DC

## 2013-09-21 NOTE — Patient Instructions (Signed)
It is important that you exercise regularly, at least 20 minutes 3 to 4 times per week.  If you develop chest pain or shortness of breath seek  medical attention.  You need to lose weight.  Consider a lower calorie diet and regular exercise.  Return in 6 months for follow-up  

## 2013-09-21 NOTE — Progress Notes (Signed)
Patient ID: Katie Becker, female   DOB: [birth year], 62 y.o.   MRN: [id number]  Subjective:    Patient ID: Katie Becker, female    DOB: 1951/08/12, 62 y.o.   MRN: [id number]  Hypertension Pertinent negatives include no chest pain, headaches, palpitations or shortness of breath.  62   year old patient who is seen today for a preventive health examination. She is doing quite well. Medical problems include hypertension and dyslipidemia. She has had a hysterectomy and is still followed by gynecology.  She has a recent diagnosis of left breast DCIS and is scheduled for a lumpectomy on December 18. Her last colonoscopy 2005   Wt Readings from Last 3 Encounters:  09/21/13 184 lb (83.462 kg)  09/15/13 180 lb 3.2 oz (81.738 kg)  09/01/13 182 lb 3.2 oz (82.645 kg)    Past Medical History  Diagnosis Date  . DIVERTICULOSIS, COLON 04/20/2007  . GERD 04/20/2007  . HEPATOMEGALY, HX OF 12/21/2009  . HYPERLIPIDEMIA 04/20/2007  . HYPERTENSION 04/20/2007  . LOW BACK PAIN 08/16/2008  . MENOPAUSAL SYNDROME 04/20/2007  . PEPTIC ULCER DISEASE 09/06/2010  . Breast cancer     History   Social History  . Marital Status: Widowed    Spouse Name: N/A    Number of Children: N/A  . Years of Education: N/A   Occupational History  . Not on file.   Social History Main Topics  . Smoking status: Former Smoker    Quit date: 10/21/1978  . Smokeless tobacco: Never Used  . Alcohol Use: No  . Drug Use: No  . Sexual Activity: Not Currently   Other Topics Concern  . Not on file   Social History Narrative  . No narrative on file    Past Surgical History  Procedure Laterality Date  . Cholecystectomy    . Abdominal hysterectomy    . Tubal ligation    . Carpal tunnel release Bilateral     Family History  Problem Relation Age of Onset  . Hypertension Mother   . Hyperlipidemia Mother   . Breast cancer Mother   . Heart disease Mother     congestive heart failure  . Cancer Father     hx of melanoma  .  Hypertension Sister   . Hypertension Brother   . Cancer Maternal Grandmother     colon  . Prostate cancer Cousin     Allergies  Allergen Reactions  . Penicillins Anaphylaxis  . Erythromycin Nausea And Vomiting  . Amoxicillin     REACTION: unspecified  . Sulfamethoxazole     REACTION: unspecified    Current Outpatient Prescriptions on File Prior to Visit  Medication Sig Dispense Refill  . Calcium Carbonate-Vitamin D (CALCIUM + D PO) Take 1,200 mg by mouth daily.      . hydrochlorothiazide (HYDRODIURIL) 25 MG tablet TAKE 1 TABLET DAILY  90 tablet  3  . LIPITOR 40 MG tablet TAKE 1 TABLET DAILY  90 tablet  3  . Multiple Vitamins-Minerals (WOMENS MULTIVITAMIN PLUS PO) Take 100 mg by mouth daily.      Marland Kitchen NEXIUM 40 MG capsule TAKE 1 CAPSULE DAILY  90 each  3  . pyridOXINE (VITAMIN B-6) 100 MG tablet Take 100 mg by mouth daily.       No current facility-administered medications on file prior to visit.    BP 142/90  Pulse 78  Temp(Src) 97.9 F (36.6 C) (Oral)  Resp 20  Ht 5\' 3"  (1.6 m)  Wt 184  lb (83.462 kg)  BMI 32.60 kg/m2  SpO2 98%        Review of Systems  Constitutional: Positive for unexpected weight change. Negative for fever, appetite change and fatigue.  HENT: Negative for congestion, dental problem, ear pain, hearing loss, mouth sores, nosebleeds, sinus pressure, sore throat, tinnitus, trouble swallowing and voice change.   Eyes: Negative for photophobia, pain, redness and visual disturbance.  Respiratory: Negative for cough, chest tightness and shortness of breath.   Cardiovascular: Negative for chest pain, palpitations and leg swelling.  Gastrointestinal: Negative for nausea, vomiting, abdominal pain, diarrhea, constipation, blood in stool, abdominal distention and rectal pain.  Genitourinary: Negative for dysuria, urgency, frequency, hematuria, flank pain, vaginal bleeding, vaginal discharge, difficulty urinating, genital sores, vaginal pain, menstrual problem  and pelvic pain.  Musculoskeletal: Positive for back pain. Negative for arthralgias and neck stiffness.  Skin: Negative for rash.  Neurological: Negative for dizziness, syncope, speech difficulty, weakness, light-headedness, numbness and headaches.  Hematological: Negative for adenopathy. Does not bruise/bleed easily.  Psychiatric/Behavioral: Negative for suicidal ideas, behavioral problems, self-injury, dysphoric mood and agitation. The patient is not nervous/anxious.        Objective:   Physical Exam  Constitutional: She is oriented to person, place, and time. She appears well-developed and well-nourished.  HENT:  Head: Normocephalic and atraumatic.  Right Ear: External ear normal.  Left Ear: External ear normal.  Mouth/Throat: Oropharynx is clear and moist.  Eyes: Conjunctivae and EOM are normal.  Neck: Normal range of motion. Neck supple. No JVD present. No thyromegaly present.  Cardiovascular: Normal rate, regular rhythm, normal heart sounds and intact distal pulses.   No murmur heard. Pulmonary/Chest: Effort normal and breath sounds normal. She has no wheezes. She has no rales.  Left breast with ecchymosis related to recent biopsy  Abdominal: Soft. Bowel sounds are normal. She exhibits no distension and no mass. There is no tenderness. There is no rebound and no guarding.  Liver edge palpable on inspiration  Musculoskeletal: Normal range of motion. She exhibits no edema and no tenderness.  Neurological: She is alert and oriented to person, place, and time. She has normal reflexes. No cranial nerve deficit. She exhibits normal muscle tone. Coordination normal.  Skin: Skin is warm and dry. No rash noted.  Psychiatric: She has a normal mood and affect. Her behavior is normal.          Assessment & Plan:    Preventive health examination Hypertension well controlled Hypercholesterolemia stable  Gastroesophageal reflux disease stable Left breast DSIS   Weight loss exercise  and her reflux regimen I'll encouraged Recheck in one year or when necessary

## 2013-09-21 NOTE — Progress Notes (Signed)
Pre-visit discussion using our clinic review tool. No additional management support is needed unless otherwise documented below in the visit note.  

## 2013-09-25 ENCOUNTER — Encounter: Payer: Self-pay | Admitting: Internal Medicine

## 2013-09-28 ENCOUNTER — Encounter (HOSPITAL_COMMUNITY): Payer: Self-pay | Admitting: Pharmacy Technician

## 2013-09-30 NOTE — Pre-Procedure Instructions (Signed)
Katie Becker  09/30/2013   Your procedure is scheduled on:  Thurs, Dec 18 @ 3:30 PM  Report to [facility] Stay [facility]trance A  at 1:30 PM.  Call this number if you have problems the morning of surgery: [phone]   Remember:   Do not eat food or drink liquids after midnight.   Take these medicines the morning of surgery with A SIP OF WATER: Zyrtec(Cetirizine-if needed) and Nexium(Esomeprazole)               No Goody's,BC's,Aleve,Ibuprofen,Aspirin,Fish Oil,or any Herbal Medications   Do not wear jewelry, make-up or nail polish.  Do not wear lotions, powders, or perfumes. You may wear deodorant.  Do not shave 48 hours prior to surgery.   Do not bring valuables to the hospital.  [facility] is not responsible                  for any belongings or valuables.               Contacts, dentures or bridgework may not be worn into surgery.  Leave suitcase in the car. After surgery it may be brought to your room.  For patients admitted to the hospital, discharge time is determined by your                treatment team.               Patients discharged the day of surgery will not be allowed to drive  home.    Special Instructions: Shower using CHG 2 nights before surgery and the night before surgery.  If you shower the day of surgery use CHG.  Use special wash - you have one bottle of CHG for all showers.  You should use approximately 1/3 of the bottle for each shower.   Please read over the following fact sheets that you were given: Pain Booklet, Coughing and Deep Breathing and Surgical Site Infection Prevention

## 2013-10-01 ENCOUNTER — Encounter (HOSPITAL_COMMUNITY)
Admission: RE | Admit: 2013-10-01 | Discharge: 2013-10-01 | Disposition: A | Discharge status: Home / Self Care | Payer: BC Managed Care – PPO | Source: Ambulatory Visit | Attending: General Surgery | Admitting: General Surgery

## 2013-10-01 ENCOUNTER — Encounter (HOSPITAL_COMMUNITY): Payer: Self-pay

## 2013-10-01 ENCOUNTER — Ambulatory Visit (HOSPITAL_COMMUNITY)
Admission: RE | Admit: 2013-10-01 | Discharge: 2013-10-01 | Disposition: A | Discharge status: Home / Self Care | Payer: BC Managed Care – PPO | Source: Ambulatory Visit | Attending: General Surgery | Admitting: General Surgery

## 2013-10-01 DIAGNOSIS — Z01818 Encounter for other preprocedural examination: Secondary | ICD-10-CM | POA: Insufficient documentation

## 2013-10-01 DIAGNOSIS — Z01812 Encounter for preprocedural laboratory examination: Secondary | ICD-10-CM | POA: Insufficient documentation

## 2013-10-01 HISTORY — DX: Cardiac murmur, unspecified: R01.1

## 2013-10-01 HISTORY — DX: Unspecified osteoarthritis, unspecified site: M19.90

## 2013-10-01 HISTORY — DX: Personal history of other diseases of the digestive system: Z87.19

## 2013-10-01 LAB — BASIC METABOLIC PANEL
BUN: 13 mg/dL (ref 6–23)
CO2: 28 mEq/L (ref 19–32)
Calcium: 9.6 mg/dL (ref 8.4–10.5)
Creatinine, Ser: 0.84 mg/dL (ref 0.50–1.10)
GFR calc Af Amer: 86 mL/min — ABNORMAL LOW (ref >90)
Glucose, Bld: 124 mg/dL — ABNORMAL HIGH (ref 70–99)
Sodium: 141 mEq/L (ref 135–145)

## 2013-10-01 LAB — CBC
Hemoglobin: 15.2 g/dL — ABNORMAL HIGH (ref 12.0–15.0)
MCH: 29.7 pg (ref 26.0–34.0)
MCHC: 35 g/dL (ref 30.0–36.0)
MCV: 84.9 fL (ref 78.0–100.0)
Platelets: 228 10*3/uL (ref 150–400)
RBC: 5.11 MIL/uL (ref 3.87–5.11)

## 2013-10-06 MED ORDER — CIPROFLOXACIN IN D5W 400 MG/200ML IV SOLN
400.0000 mg | INTRAVENOUS | Status: AC
  Administered 2013-10-07: 400 mg via INTRAVENOUS
  Filled 2013-10-06: qty 200

## 2013-10-07 ENCOUNTER — Ambulatory Visit
Admission: RE | Admit: 2013-10-07 | Discharge: 2013-10-07 | Disposition: A | Discharge status: Home / Self Care | Payer: BC Managed Care – PPO | Source: Ambulatory Visit | Attending: General Surgery | Admitting: General Surgery

## 2013-10-07 ENCOUNTER — Ambulatory Visit (HOSPITAL_COMMUNITY): Payer: BC Managed Care – PPO | Admitting: Certified Registered Nurse Anesthetist

## 2013-10-07 ENCOUNTER — Encounter (HOSPITAL_BASED_OUTPATIENT_CLINIC_OR_DEPARTMENT_OTHER): Payer: Self-pay

## 2013-10-07 ENCOUNTER — Ambulatory Visit (HOSPITAL_COMMUNITY)
Admission: RE | Admit: 2013-10-07 | Discharge: 2013-10-07 | Disposition: A | Discharge status: Home / Self Care | Payer: BC Managed Care – PPO | Source: Ambulatory Visit | Attending: General Surgery | Admitting: General Surgery

## 2013-10-07 ENCOUNTER — Encounter (HOSPITAL_COMMUNITY)
Admission: RE | Disposition: A | Discharge status: Home / Self Care | Payer: Self-pay | Source: Ambulatory Visit | Attending: General Surgery

## 2013-10-07 ENCOUNTER — Encounter (HOSPITAL_COMMUNITY): Payer: Self-pay | Admitting: *Deleted

## 2013-10-07 ENCOUNTER — Encounter (HOSPITAL_COMMUNITY): Payer: BC Managed Care – PPO | Admitting: Certified Registered Nurse Anesthetist

## 2013-10-07 ENCOUNTER — Ambulatory Visit (HOSPITAL_BASED_OUTPATIENT_CLINIC_OR_DEPARTMENT_OTHER): Admit: 2013-10-07 | Payer: BC Managed Care – PPO | Admitting: General Surgery

## 2013-10-07 DIAGNOSIS — C50512 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: Secondary | ICD-10-CM

## 2013-10-07 DIAGNOSIS — L918 Other hypertrophic disorders of the skin: Secondary | ICD-10-CM

## 2013-10-07 DIAGNOSIS — D0590 Unspecified type of carcinoma in situ of unspecified breast: Secondary | ICD-10-CM

## 2013-10-07 DIAGNOSIS — R928 Other abnormal and inconclusive findings on diagnostic imaging of breast: Secondary | ICD-10-CM | POA: Insufficient documentation

## 2013-10-07 DIAGNOSIS — L909 Atrophic disorder of skin, unspecified: Secondary | ICD-10-CM | POA: Insufficient documentation

## 2013-10-07 DIAGNOSIS — I10 Essential (primary) hypertension: Secondary | ICD-10-CM | POA: Insufficient documentation

## 2013-10-07 DIAGNOSIS — Z87891 Personal history of nicotine dependence: Secondary | ICD-10-CM | POA: Insufficient documentation

## 2013-10-07 DIAGNOSIS — Q828 Other specified congenital malformations of skin: Secondary | ICD-10-CM

## 2013-10-07 HISTORY — PX: BREAST LUMPECTOMY WITH NEEDLE LOCALIZATION: SHX5759

## 2013-10-07 SURGERY — BREAST LUMPECTOMY WITH NEEDLE LOCALIZATION
Anesthesia: General | Site: Breast | Laterality: Left

## 2013-10-07 MED ORDER — CHLORHEXIDINE GLUCONATE 4 % EX LIQD: 1.0000 "application " | Freq: Once | CUTANEOUS | Status: DC

## 2013-10-07 MED ORDER — HYDROCODONE-ACETAMINOPHEN 5-325 MG PO TABS: 1.0000 | ORAL_TABLET | ORAL | Status: DC | PRN

## 2013-10-07 MED ORDER — LACTATED RINGERS IV SOLN
INTRAVENOUS | Status: DC
  Administered 2013-10-07: 14:00:00 via INTRAVENOUS

## 2013-10-07 MED ORDER — 0.9 % SODIUM CHLORIDE (POUR BTL) OPTIME
TOPICAL | Status: DC | PRN
  Administered 2013-10-07: 1000 mL

## 2013-10-07 MED ORDER — MIDAZOLAM HCL 5 MG/5ML IJ SOLN
INTRAMUSCULAR | Status: DC | PRN
  Administered 2013-10-07: 2 mg via INTRAVENOUS

## 2013-10-07 MED ORDER — OXYCODONE HCL 5 MG PO TABS
ORAL_TABLET | ORAL | Status: AC
  Administered 2013-10-07: 5 mg via ORAL
  Filled 2013-10-07: qty 1

## 2013-10-07 MED ORDER — PROPOFOL 10 MG/ML IV BOLUS
INTRAVENOUS | Status: DC | PRN
  Administered 2013-10-07: 200 mg via INTRAVENOUS

## 2013-10-07 MED ORDER — OXYCODONE HCL 5 MG/5ML PO SOLN: 5.0000 mg | Freq: Once | ORAL | Status: AC | PRN

## 2013-10-07 MED ORDER — PROMETHAZINE HCL 25 MG/ML IJ SOLN: 6.2500 mg | INTRAMUSCULAR | Status: DC | PRN

## 2013-10-07 MED ORDER — ONDANSETRON HCL 4 MG/2ML IJ SOLN
INTRAMUSCULAR | Status: DC | PRN
  Administered 2013-10-07: 4 mg via INTRAVENOUS

## 2013-10-07 MED ORDER — LIDOCAINE HCL (CARDIAC) 20 MG/ML IV SOLN
INTRAVENOUS | Status: DC | PRN
  Administered 2013-10-07: 100 mg via INTRAVENOUS

## 2013-10-07 MED ORDER — BUPIVACAINE-EPINEPHRINE 0.5% -1:200000 IJ SOLN
INTRAMUSCULAR | Status: DC | PRN
  Administered 2013-10-07: 29 mL

## 2013-10-07 MED ORDER — FENTANYL CITRATE 0.05 MG/ML IJ SOLN
INTRAMUSCULAR | Status: DC | PRN
  Administered 2013-10-07: 25 ug via INTRAVENOUS
  Administered 2013-10-07: 100 ug via INTRAVENOUS

## 2013-10-07 MED ORDER — HYDROMORPHONE HCL PF 1 MG/ML IJ SOLN: 0.2500 mg | INTRAMUSCULAR | Status: DC | PRN

## 2013-10-07 MED ORDER — BUPIVACAINE-EPINEPHRINE (PF) 0.5% -1:200000 IJ SOLN
INTRAMUSCULAR | Status: AC
  Filled 2013-10-07: qty 10

## 2013-10-07 MED ORDER — OXYCODONE HCL 5 MG PO TABS
5.0000 mg | ORAL_TABLET | Freq: Once | ORAL | Status: AC | PRN
  Administered 2013-10-07: 5 mg via ORAL

## 2013-10-07 MED ORDER — LACTATED RINGERS IV SOLN
INTRAVENOUS | Status: DC | PRN
  Administered 2013-10-07 (×2): via INTRAVENOUS

## 2013-10-07 SURGICAL SUPPLY — 42 items
BINDER BREAST XLRG (GAUZE/BANDAGES/DRESSINGS) ×2 IMPLANT
BLADE SURG 10 STRL SS (BLADE) ×2 IMPLANT
BLADE SURG 15 STRL LF DISP TIS (BLADE) ×1 IMPLANT
BLADE SURG 15 STRL SS (BLADE) ×1
CANISTER SUCTION 2500CC (MISCELLANEOUS) IMPLANT
CHLORAPREP W/TINT 26ML (MISCELLANEOUS) ×2 IMPLANT
CLIP TI MEDIUM 6 (CLIP) ×2 IMPLANT
COVER SURGICAL LIGHT HANDLE (MISCELLANEOUS) ×2 IMPLANT
DERMABOND ADVANCED (GAUZE/BANDAGES/DRESSINGS) ×1
DERMABOND ADVANCED .7 DNX12 (GAUZE/BANDAGES/DRESSINGS) ×1 IMPLANT
DEVICE DUBIN SPECIMEN MAMMOGRA (MISCELLANEOUS) ×2 IMPLANT
DRAPE CHEST BREAST 15X10 FENES (DRAPES) ×2 IMPLANT
DRAPE UTILITY 15X26 W/TAPE STR (DRAPE) ×4 IMPLANT
ELECT COATED BLADE 2.86 ST (ELECTRODE) ×2 IMPLANT
ELECT REM PT RETURN 9FT ADLT (ELECTROSURGICAL) ×2
ELECTRODE REM PT RTRN 9FT ADLT (ELECTROSURGICAL) ×1 IMPLANT
GLOVE BIO SURGEON STRL SZ7.5 (GLOVE) ×2 IMPLANT
GLOVE BIOGEL PI IND STRL 7.5 (GLOVE) ×1 IMPLANT
GLOVE BIOGEL PI IND STRL 8 (GLOVE) ×1 IMPLANT
GLOVE BIOGEL PI INDICATOR 7.5 (GLOVE) ×1
GLOVE BIOGEL PI INDICATOR 8 (GLOVE) ×1
GLOVE SS BIOGEL STRL SZ 7.5 (GLOVE) ×2 IMPLANT
GLOVE SUPERSENSE BIOGEL SZ 7.5 (GLOVE) ×2
GOWN STRL NON-REIN LRG LVL3 (GOWN DISPOSABLE) ×2 IMPLANT
GOWN STRL REIN XL XLG (GOWN DISPOSABLE) ×2 IMPLANT
KIT BASIN OR (CUSTOM PROCEDURE TRAY) ×2 IMPLANT
KIT MARKER MARGIN INK (KITS) ×2 IMPLANT
KIT ROOM TURNOVER OR (KITS) ×2 IMPLANT
NEEDLE HYPO 25X1 1.5 SAFETY (NEEDLE) ×2 IMPLANT
NS IRRIG 1000ML POUR BTL (IV SOLUTION) ×2 IMPLANT
PACK SURGICAL SETUP 50X90 (CUSTOM PROCEDURE TRAY) ×2 IMPLANT
PAD ARMBOARD 7.5X6 YLW CONV (MISCELLANEOUS) ×2 IMPLANT
PENCIL BUTTON HOLSTER BLD 10FT (ELECTRODE) ×2 IMPLANT
SPONGE LAP 18X18 X RAY DECT (DISPOSABLE) ×4 IMPLANT
SUT MON AB 5-0 PS2 18 (SUTURE) ×2 IMPLANT
SUT VIC AB 3-0 SH 18 (SUTURE) ×2 IMPLANT
SYR BULB 3OZ (MISCELLANEOUS) ×2 IMPLANT
SYR CONTROL 10ML LL (SYRINGE) ×2 IMPLANT
TOWEL OR 17X24 6PK STRL BLUE (TOWEL DISPOSABLE) ×2 IMPLANT
TOWEL OR 17X26 10 PK STRL BLUE (TOWEL DISPOSABLE) ×2 IMPLANT
TUBE CONNECTING 12X1/4 (SUCTIONS) ×2 IMPLANT
YANKAUER SUCT BULB TIP NO VENT (SUCTIONS) ×2 IMPLANT

## 2013-10-07 NOTE — Transfer of Care (Signed)
Immediate Anesthesia Transfer of Care Note  Patient: Katie Becker  Procedure(s) Performed: Procedure(s): BREAST LUMPECTOMY WITH NEEDLE LOCALIZATION EXCISION OF BILATERAL AXILLARY SKIN TAGS  (Left)  Patient Location: PACU  Anesthesia Type:General  Level of Consciousness: awake and alert   Airway & Oxygen Therapy: Patient Spontanous Breathing and Patient connected to nasal cannula oxygen  Post-op Assessment: Report given to PACU RN, Post -op Vital signs reviewed and stable and Patient moving all extremities  Post vital signs: Reviewed and stable  Complications: No apparent anesthesia complications

## 2013-10-07 NOTE — H&P (View-Only) (Signed)
Chief complaint: Followup DCIS left breast  History: Patient returns for further preoperative discussion and treatment planning for ductal carcinoma in situ left breast. Please see my previous dictation from earlier this month for details of her current illness. She has an approximately 3.5 x 1.5 cm area of biopsy proven carcinoma in situ at about the 6:00 position of the left breast. In addition she had bilateral abnormalities noted on MR and since her last visit she has had bilateral MR guided biopsies. These both revealed benign fibrocystic disease. We discussed the results of this study. The patient is now ready to proceed with scheduling for left breast lumpectomy as we had previously discussed. The patient also today is complaining of multiple axillary skin tags that are intermittently irrigated and inflamed and is asking these can be removed at the time of her lumpectomy.  Past Medical History  Diagnosis Date  . DIVERTICULOSIS, COLON 04/20/2007  . GERD 04/20/2007  . HEPATOMEGALY, HX OF 12/21/2009  . HYPERLIPIDEMIA 04/20/2007  . HYPERTENSION 04/20/2007  . LOW BACK PAIN 08/16/2008  . MENOPAUSAL SYNDROME 04/20/2007  . PEPTIC ULCER DISEASE 09/06/2010  . Breast cancer    Past Surgical History  Procedure Laterality Date  . Cholecystectomy    . Abdominal hysterectomy    . Tubal ligation    . Carpal tunnel release Bilateral    Current Outpatient Prescriptions  Medication Sig Dispense Refill  . Calcium Carbonate-Vitamin D (CALCIUM + D PO) Take 1,200 mg by mouth daily.      . hydrochlorothiazide (HYDRODIURIL) 25 MG tablet TAKE 1 TABLET DAILY  90 tablet  3  . LIPITOR 40 MG tablet TAKE 1 TABLET DAILY  90 tablet  3  . Multiple Vitamins-Minerals (WOMENS MULTIVITAMIN PLUS PO) Take 100 mg by mouth daily.      Marland Kitchen NEXIUM 40 MG capsule TAKE 1 CAPSULE DAILY  90 each  3  . pyridOXINE (VITAMIN B-6) 100 MG tablet Take 100 mg by mouth daily.       No current facility-administered medications for this visit.    Allergies  Allergen Reactions  . Penicillins Anaphylaxis  . Erythromycin Nausea And Vomiting  . Amoxicillin     REACTION: unspecified  . Sulfamethoxazole     REACTION: unspecified   Exam: BP 138/78  Pulse 96  Temp(Src) 98.6 F (37 C) (Temporal)  Resp 14  Ht 5\' 2"  (1.575 m)  Wt 180 lb 3.2 oz (81.738 kg)  BMI 32.95 kg/m2 General: Mild overweight appears well Skin: There are multiple probably 8 or 10 on either side axillary skin tags Breasts: Healing core biopsy sites bilaterally. No palpable masses. 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   #2 multiple axillary bilateral skin tags. We will plan to proceed with needle localized left breast lumpectomy under general anesthesia as an outpatient as well as excision of bilateral axillary skin tags. We again discussed the nature of the procedure and recovery as well as risks of anesthetic complications, wound healing, bleeding, infection and possible need for further surgery based on the final margin assessment. All her questions were answered.

## 2013-10-07 NOTE — Anesthesia Procedure Notes (Signed)
Procedure Name: LMA Insertion Date/Time: 10/07/2013 4:21 PM Performed by: Vita Barley E Pre-anesthesia Checklist: Patient identified, Emergency Drugs available, Suction available and Patient being monitored Patient Re-evaluated:Patient Re-evaluated prior to inductionOxygen Delivery Method: Circle system utilized Preoxygenation: Pre-oxygenation with 100% oxygen Intubation Type: IV induction Ventilation: Mask ventilation without difficulty LMA: LMA inserted LMA Size: 4.0 Number of attempts: 1 Placement Confirmation: positive ETCO2 and breath sounds checked- equal and bilateral Tube secured with: Tape Dental Injury: Teeth and Oropharynx as per pre-operative assessment

## 2013-10-07 NOTE — Op Note (Signed)
Preoperative Diagnosis: left breast cancer  and multiple bilateral irritated axillary skin tags  Postoprative Diagnosis: left breast cancer  and multiple bilateral irritated axillary skin tags  Procedure: Procedure(s): BREAST LUMPECTOMY WITH NEEDLE LOCALIZATION EXCISION OF BILATERAL AXILLARY SKIN TAGS    Surgeon: Glenna Fellows T   Assistants: none  Anesthesia:  General LMA anesthesia  Indications:  Patient is a 62 year old female who on a recent mammogram was found to have a new area of calcifications in the inferior left breast. Port core needle biopsy was performed showing ductal carcinoma in situ with calcifications an MRI indicating a maximum size of approximately 3.5 cm. After discussion of treatment options and risks detailed elsewhere we elect to proceed with needle localized lumpectomy his initial surgical treatment. She also has bilateral multiple axillary skin tags which are occasionally irritated a symptomatic and we have planned to remove these at the same anesthesia.  Procedure Detail:  Following accurate needle localization at the breast center, the patient is brought to the operating room, placed in the supine position on the operating table and laryngeal mask general anesthesia induced. The entire chest and bilateral axillae and upper arms were widely sterilely prepped and draped. A curvilinear incision was made at the areolar border on the left just above the wire insertion site and dissection carried down into the subcutaneous tissue. The dissection was extended out laterally and the wire encountered and brought into the incision. I then using cautery excised a generous portion of breast tissue around the shaft and tip of the wire to obtain a negative margin. The specimen mammogram was obtained showing the wire and clip and calcifications contained centrally within the specimen. This was sent for permanent pathology. The wound was irrigated and complete hemostasis obtained. The  soft tissue is infiltrated with Marcaine. The lumpectomy cavity was marked with hemoclips.The wound was closed in layers with breast the subcutaneous tissue closed with interrupted 3-0 Vicryl and skin with subcuticular 4-0 Monocryl and Dermabond. Bilateral skin tags in the axillae were then sharply excised and hemostasis obtained with pressure. These were dressed with Dermabond. These were sent for permanent pathology.   Estimated Blood Loss:  Minimal         Drains: none  Blood Given: none          Specimens: #1 left breast lumpectomy      #2 multiple skin tags        Complications:  * No complications entered in OR log *         Disposition: PACU - hemodynamically stable.         Condition: stable

## 2013-10-07 NOTE — Preoperative (Signed)
Beta Blockers   Reason not to administer Beta Blockers:Not Applicable 

## 2013-10-07 NOTE — Anesthesia Preprocedure Evaluation (Addendum)
Anesthesia Evaluation  Patient identified by MRN, date of birth, ID band Patient awake    Reviewed: Allergy & Precautions, H&P , NPO status , Patient's Chart, lab work & pertinent test results  History of Anesthesia Complications Negative for: history of anesthetic complications  Airway       Dental  (+) Dental Advisory Given   Pulmonary former smoker,          Cardiovascular hypertension, Pt. on medications     Neuro/Psych negative neurological ROS  negative psych ROS   GI/Hepatic Neg liver ROS, hiatal hernia, PUD, GERD-  Medicated,  Endo/Other  negative endocrine ROS  Renal/GU negative Renal ROS     Musculoskeletal   Abdominal   Peds  Hematology   Anesthesia Other Findings   Reproductive/Obstetrics                          Anesthesia Physical Anesthesia Plan  ASA: II  Anesthesia Plan: General   Post-op Pain Management:    Induction: Intravenous  Airway Management Planned: LMA  Additional Equipment:   Intra-op Plan:   Post-operative Plan: Extubation in OR  Informed Consent:   Plan Discussed with: CRNA, Anesthesiologist and Surgeon  Anesthesia Plan Comments:         Anesthesia Quick Evaluation

## 2013-10-07 NOTE — Interval H&P Note (Signed)
History and Physical Interval Note:  10/07/2013 3:49 PM  Katie Becker  has presented today for surgery, with the diagnosis of left breast cancer   The various methods of treatment have been discussed with the patient and family. After consideration of risks, benefits and other options for treatment, the patient has consented to  Procedure(s): BREAST LUMPECTOMY WITH NEEDLE LOCALIZATION EXCISION OF BILATERAL AXILLARY SKIN TAGS  (Left) as a surgical intervention .  The patient's history has been reviewed, patient examined, no change in status, stable for surgery.  I have reviewed the patient's chart and labs.  Questions were answered to the patient's satisfaction.     Della Scrivener T

## 2013-10-08 NOTE — Anesthesia Postprocedure Evaluation (Signed)
  Anesthesia Post-op Note Anesthesia Post Note  Patient: Katie Becker  Procedure(s) Performed: Procedure(s) (LRB): BREAST LUMPECTOMY WITH NEEDLE LOCALIZATION EXCISION OF BILATERAL AXILLARY SKIN TAGS  (Left)  Anesthesia type: general  Patient location: PACU  Post pain: Pain level controlled  Post assessment: Patient's Cardiovascular Status Stable  Post vital signs: Reviewed and stable  Level of consciousness: sedated  Complications: No apparent anesthesia complications

## 2013-10-12 ENCOUNTER — Encounter (HOSPITAL_COMMUNITY): Payer: Self-pay | Admitting: General Surgery

## 2013-10-13 ENCOUNTER — Telehealth (INDEPENDENT_AMBULATORY_CARE_PROVIDER_SITE_OTHER): Payer: Self-pay | Admitting: General Surgery

## 2013-10-13 NOTE — Telephone Encounter (Signed)
Called the patient and discussed the results of pathology. All DCIS but to margins are close and will require reexcision. Explained to the patient who understands and agrees and we will schedule this for her.

## 2013-10-18 ENCOUNTER — Encounter (HOSPITAL_BASED_OUTPATIENT_CLINIC_OR_DEPARTMENT_OTHER): Payer: Self-pay | Admitting: *Deleted

## 2013-10-18 NOTE — Progress Notes (Signed)
For re-excision-no new labs needed

## 2013-10-19 ENCOUNTER — Telehealth: Payer: Self-pay | Admitting: *Deleted

## 2013-10-19 ENCOUNTER — Telehealth: Payer: Self-pay | Admitting: Oncology

## 2013-10-19 ENCOUNTER — Other Ambulatory Visit: Payer: Self-pay | Admitting: Emergency Medicine

## 2013-10-19 NOTE — Telephone Encounter (Signed)
Patient called asking if she needs to keep the appointment with Dr. Welton Flakes on 10-26-2012.  "Scheduled with Dr. Johna Sheriff 10-25-2012 at 1300 for more breast tissue removal.  Dr. Johna Sheriff said Dr. Welton Flakes will not be able to plan anything and I don't know what that means."  She will have to be carefull assessing my breast if I do need to come.  Have her call me at home [phone]) or mobile [phone])."  Katie Becker is aware MD will return on 10-25-2012.

## 2013-10-25 ENCOUNTER — Encounter (HOSPITAL_BASED_OUTPATIENT_CLINIC_OR_DEPARTMENT_OTHER)
Admission: RE | Disposition: A | Discharge status: Home / Self Care | Payer: Self-pay | Source: Ambulatory Visit | Attending: General Surgery

## 2013-10-25 ENCOUNTER — Ambulatory Visit (HOSPITAL_BASED_OUTPATIENT_CLINIC_OR_DEPARTMENT_OTHER)
Admission: RE | Admit: 2013-10-25 | Discharge: 2013-10-25 | Disposition: A | Discharge status: Home / Self Care | Payer: BC Managed Care – PPO | Source: Ambulatory Visit | Attending: General Surgery | Admitting: General Surgery

## 2013-10-25 ENCOUNTER — Encounter (HOSPITAL_BASED_OUTPATIENT_CLINIC_OR_DEPARTMENT_OTHER): Payer: BC Managed Care – PPO | Admitting: Anesthesiology

## 2013-10-25 ENCOUNTER — Ambulatory Visit (HOSPITAL_BASED_OUTPATIENT_CLINIC_OR_DEPARTMENT_OTHER): Payer: BC Managed Care – PPO | Admitting: Anesthesiology

## 2013-10-25 ENCOUNTER — Telehealth (INDEPENDENT_AMBULATORY_CARE_PROVIDER_SITE_OTHER): Payer: Self-pay

## 2013-10-25 ENCOUNTER — Encounter (HOSPITAL_BASED_OUTPATIENT_CLINIC_OR_DEPARTMENT_OTHER): Payer: Self-pay

## 2013-10-25 DIAGNOSIS — K219 Gastro-esophageal reflux disease without esophagitis: Secondary | ICD-10-CM | POA: Insufficient documentation

## 2013-10-25 DIAGNOSIS — N6019 Diffuse cystic mastopathy of unspecified breast: Secondary | ICD-10-CM

## 2013-10-25 DIAGNOSIS — Z853 Personal history of malignant neoplasm of breast: Secondary | ICD-10-CM

## 2013-10-25 DIAGNOSIS — Z87891 Personal history of nicotine dependence: Secondary | ICD-10-CM | POA: Insufficient documentation

## 2013-10-25 DIAGNOSIS — D0590 Unspecified type of carcinoma in situ of unspecified breast: Secondary | ICD-10-CM | POA: Insufficient documentation

## 2013-10-25 DIAGNOSIS — C50512 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: Secondary | ICD-10-CM

## 2013-10-25 DIAGNOSIS — R920 Mammographic microcalcification found on diagnostic imaging of breast: Secondary | ICD-10-CM

## 2013-10-25 DIAGNOSIS — I10 Essential (primary) hypertension: Secondary | ICD-10-CM | POA: Insufficient documentation

## 2013-10-25 HISTORY — DX: Presence of spectacles and contact lenses: Z97.3

## 2013-10-25 HISTORY — PX: RE-EXCISION OF BREAST LUMPECTOMY: SHX6048

## 2013-10-25 SURGERY — EXCISION, LESION, BREAST
Anesthesia: General | Site: Breast | Laterality: Left

## 2013-10-25 MED ORDER — CIPROFLOXACIN IN D5W 400 MG/200ML IV SOLN
INTRAVENOUS | Status: AC
  Filled 2013-10-25: qty 200

## 2013-10-25 MED ORDER — FENTANYL CITRATE 0.05 MG/ML IJ SOLN
INTRAMUSCULAR | Status: AC
  Filled 2013-10-25: qty 6

## 2013-10-25 MED ORDER — CIPROFLOXACIN IN D5W 400 MG/200ML IV SOLN
INTRAVENOUS | Status: DC | PRN
Start: 2013-10-25 — End: 2013-10-25
  Administered 2013-10-25: 400 mg via INTRAVENOUS

## 2013-10-25 MED ORDER — ONDANSETRON HCL 4 MG/2ML IJ SOLN
INTRAMUSCULAR | Status: DC | PRN
  Administered 2013-10-25: 4 mg via INTRAVENOUS

## 2013-10-25 MED ORDER — MIDAZOLAM HCL 5 MG/5ML IJ SOLN
INTRAMUSCULAR | Status: DC | PRN
  Administered 2013-10-25: 2 mg via INTRAVENOUS

## 2013-10-25 MED ORDER — MIDAZOLAM HCL 2 MG/2ML IJ SOLN
INTRAMUSCULAR | Status: AC
  Filled 2013-10-25: qty 2

## 2013-10-25 MED ORDER — FENTANYL CITRATE 0.05 MG/ML IJ SOLN
INTRAMUSCULAR | Status: DC | PRN
  Administered 2013-10-25: 100 ug via INTRAVENOUS

## 2013-10-25 MED ORDER — OXYCODONE HCL 5 MG PO TABS: 5.0000 mg | ORAL_TABLET | Freq: Once | ORAL | Status: DC | PRN

## 2013-10-25 MED ORDER — HYDROMORPHONE HCL PF 1 MG/ML IJ SOLN
INTRAMUSCULAR | Status: AC
  Filled 2013-10-25: qty 1

## 2013-10-25 MED ORDER — OXYCODONE HCL 5 MG/5ML PO SOLN: 5.0000 mg | Freq: Once | ORAL | Status: DC | PRN

## 2013-10-25 MED ORDER — BUPIVACAINE-EPINEPHRINE 0.5% -1:200000 IJ SOLN
INTRAMUSCULAR | Status: DC | PRN
  Administered 2013-10-25: 30 mL

## 2013-10-25 MED ORDER — LACTATED RINGERS IV SOLN
INTRAVENOUS | Status: DC
  Administered 2013-10-25: 10 mL/h via INTRAVENOUS
  Administered 2013-10-25: 12:00:00 via INTRAVENOUS

## 2013-10-25 MED ORDER — PROPOFOL 10 MG/ML IV BOLUS
INTRAVENOUS | Status: DC | PRN
  Administered 2013-10-25: 150 mg via INTRAVENOUS

## 2013-10-25 MED ORDER — MEPERIDINE HCL 25 MG/ML IJ SOLN: 6.2500 mg | INTRAMUSCULAR | Status: DC | PRN

## 2013-10-25 MED ORDER — BUPIVACAINE-EPINEPHRINE PF 0.5-1:200000 % IJ SOLN
INTRAMUSCULAR | Status: AC
  Filled 2013-10-25: qty 30

## 2013-10-25 MED ORDER — LIDOCAINE HCL (CARDIAC) 20 MG/ML IV SOLN
INTRAVENOUS | Status: DC | PRN
  Administered 2013-10-25: 75 mg via INTRAVENOUS

## 2013-10-25 MED ORDER — ONDANSETRON HCL 4 MG/2ML IJ SOLN: 4.0000 mg | Freq: Once | INTRAMUSCULAR | Status: DC | PRN

## 2013-10-25 MED ORDER — DEXAMETHASONE SODIUM PHOSPHATE 4 MG/ML IJ SOLN
INTRAMUSCULAR | Status: DC | PRN
  Administered 2013-10-25: 10 mg via INTRAVENOUS

## 2013-10-25 MED ORDER — HYDROMORPHONE HCL PF 1 MG/ML IJ SOLN
0.2500 mg | INTRAMUSCULAR | Status: DC | PRN
  Administered 2013-10-25: 0.5 mg via INTRAVENOUS

## 2013-10-25 SURGICAL SUPPLY — 46 items
BLADE SURG 10 STRL SS (BLADE) IMPLANT
BLADE SURG 15 STRL LF DISP TIS (BLADE) ×1 IMPLANT
BLADE SURG 15 STRL SS (BLADE) ×1
CANISTER SUCT 1200ML W/VALVE (MISCELLANEOUS) IMPLANT
CHLORAPREP W/TINT 26ML (MISCELLANEOUS) ×2 IMPLANT
CLIP TI MEDIUM 6 (CLIP) IMPLANT
CLIP TI WIDE RED SMALL 6 (CLIP) ×2 IMPLANT
COVER MAYO STAND STRL (DRAPES) ×2 IMPLANT
COVER TABLE BACK 60X90 (DRAPES) ×2 IMPLANT
DERMABOND ADVANCED (GAUZE/BANDAGES/DRESSINGS) ×1
DERMABOND ADVANCED .7 DNX12 (GAUZE/BANDAGES/DRESSINGS) ×1 IMPLANT
DEVICE DUBIN W/COMP PLATE 8390 (MISCELLANEOUS) IMPLANT
DRAPE PED LAPAROTOMY (DRAPES) ×2 IMPLANT
DRAPE UTILITY XL STRL (DRAPES) ×2 IMPLANT
ELECT COATED BLADE 2.86 ST (ELECTRODE) ×2 IMPLANT
ELECT REM PT RETURN 9FT ADLT (ELECTROSURGICAL) ×2
ELECTRODE REM PT RTRN 9FT ADLT (ELECTROSURGICAL) ×1 IMPLANT
GLOVE BIOGEL PI IND STRL 8 (GLOVE) ×1 IMPLANT
GLOVE BIOGEL PI INDICATOR 8 (GLOVE) ×1
GLOVE ECLIPSE 6.5 STRL STRAW (GLOVE) ×4 IMPLANT
GLOVE SS BIOGEL STRL SZ 7.5 (GLOVE) ×1 IMPLANT
GLOVE SUPERSENSE BIOGEL SZ 7.5 (GLOVE) ×1
GOWN STRL REUS W/ TWL LRG LVL3 (GOWN DISPOSABLE) ×1 IMPLANT
GOWN STRL REUS W/ TWL XL LVL3 (GOWN DISPOSABLE) ×1 IMPLANT
GOWN STRL REUS W/TWL LRG LVL3 (GOWN DISPOSABLE) ×1
GOWN STRL REUS W/TWL XL LVL3 (GOWN DISPOSABLE) ×1
KIT MARKER MARGIN INK (KITS) IMPLANT
NEEDLE HYPO 25X1 1.5 SAFETY (NEEDLE) ×2 IMPLANT
NS IRRIG 1000ML POUR BTL (IV SOLUTION) IMPLANT
PACK BASIN DAY SURGERY FS (CUSTOM PROCEDURE TRAY) ×2 IMPLANT
PENCIL BUTTON HOLSTER BLD 10FT (ELECTRODE) ×2 IMPLANT
SLEEVE SCD COMPRESS KNEE MED (MISCELLANEOUS) ×2 IMPLANT
STAPLER VISISTAT 35W (STAPLE) IMPLANT
SUT MNCRL AB 4-0 PS2 18 (SUTURE) IMPLANT
SUT MON AB 5-0 PS2 18 (SUTURE) ×2 IMPLANT
SUT SILK 3 0 SH 30 (SUTURE) IMPLANT
SUT VIC AB 3-0 SH 27 (SUTURE)
SUT VIC AB 3-0 SH 27X BRD (SUTURE) IMPLANT
SUT VIC AB 4-0 BRD 54 (SUTURE) IMPLANT
SUT VICRYL 3-0 CR8 SH (SUTURE) ×2 IMPLANT
SYR BULB 3OZ (MISCELLANEOUS) IMPLANT
SYR CONTROL 10ML LL (SYRINGE) ×2 IMPLANT
TOWEL OR 17X24 6PK STRL BLUE (TOWEL DISPOSABLE) ×2 IMPLANT
TOWEL OR NON WOVEN STRL DISP B (DISPOSABLE) IMPLANT
TUBE CONNECTING 20X1/4 (TUBING) IMPLANT
YANKAUER SUCT BULB TIP NO VENT (SUCTIONS) IMPLANT

## 2013-10-25 NOTE — Anesthesia Postprocedure Evaluation (Signed)
  Anesthesia Post-op Note  Patient: Katie Becker  Procedure(s) Performed: Procedure(s) with comments: RE-EXCISION OF BREAST LUMPECTOMY (Left) - clean wound class  Patient Location: PACU  Anesthesia Type:General  Level of Consciousness: awake and alert   Airway and Oxygen Therapy: Patient Spontanous Breathing  Post-op Pain: mild  Post-op Assessment: Post-op Vital signs reviewed, Patient's Cardiovascular Status Stable and Respiratory Function Stable  Post-op Vital Signs: Reviewed  Filed Vitals:   10/25/13 1515  BP: 134/72  Pulse: 80  Temp:   Resp: 13    Complications: No apparent anesthesia complications

## 2013-10-25 NOTE — Transfer of Care (Signed)
Immediate Anesthesia Transfer of Care Note  Patient: Katie Becker  Procedure(s) Performed: Procedure(s) with comments: RE-EXCISION OF BREAST LUMPECTOMY (Left) - clean wound class  Patient Location: PACU  Anesthesia Type:General  Level of Consciousness: awake  Airway & Oxygen Therapy: Patient Spontanous Breathing and Patient connected to face mask oxygen  Post-op Assessment: Report given to PACU RN and Post -op Vital signs reviewed and stable  Post vital signs: Reviewed and stable  Complications: No apparent anesthesia complications

## 2013-10-25 NOTE — H&P (Signed)
  Chief complaint: Ductal carcinoma in situ of the left breast  History: Patient is approximately 2 weeks status post needle localized left breast lumpectomy for large core needle biopsy revealing ductal carcinoma in situ. Final pathology confirmed ductal carcinoma in situ but the inferior lateral margin was within 1 mm and the superior margin was 1.5 mm. In order to satisfy our guidelines of 2 mm negative margins with DCIS I have recommended proceeding with reexcision of these margins to obtain a more widely negative margin.  Past Medical History  Diagnosis Date  . DIVERTICULOSIS, COLON 04/20/2007  . GERD 04/20/2007  . HEPATOMEGALY, HX OF 12/21/2009  . HYPERLIPIDEMIA 04/20/2007  . HYPERTENSION 04/20/2007  . LOW BACK PAIN 08/16/2008  . MENOPAUSAL SYNDROME 04/20/2007  . PEPTIC ULCER DISEASE 09/06/2010  . H/O hiatal hernia   . Breast cancer     diagnosed on MRI- biopsy  . Arthritis     back  . Wears glasses   . Heart murmur     told by PCP- years ago-never had echo   Past Surgical History  Procedure Laterality Date  . Cholecystectomy    . Abdominal hysterectomy    . Tubal ligation    . Carpal tunnel release Bilateral   . Breast lumpectomy with needle localization Left 10/07/2013    Procedure: BREAST LUMPECTOMY WITH NEEDLE LOCALIZATION EXCISION OF BILATERAL AXILLARY SKIN TAGS ;  Surgeon: Edward Jolly, MD;  Location: [facility];  Service: General;  Laterality: Left;   Current Facility-Administered Medications  Medication Dose Route Frequency Provider Last Rate Last Dose  . lactated ringers infusion   Intravenous Continuous Leda Quail, MD 20 mL/hr at 10/25/13 1143     Exam: BP 133/70  Pulse 72  Temp(Src) 97.6 F (36.4 C) (Oral)  Resp 20  Ht 5\' 2"  (1.575 m)  Wt 180 lb (81.647 kg)  BMI 32.91 kg/m2  SpO2 98% Gen.: Mildly overweight female in no distress Skin: No rash or infection HEENT: No probable masses. Sclera nonicteric  breasts: Healing circumareolar incision on the left with  no evidence of infection. Lymph nodes: No cervical, subclavicular or axillary nodes palpable Lungs: Clear equal breath sounds bilaterally Cardiac: Regular rate and rhythm no murmurs  Assessment and plan: Status post recent left breast lumpectomy for ductal carcinoma in situ with microscopically negative but close margins inferior, lateral and superior. Patient is to undergo reexcision of these margins. I discussed the indications for the surgery and its nature and risks of bleeding, infection, anesthetic risks and possible need for further surgery based on final pathology. She understands and agrees all her questions are answered.

## 2013-10-25 NOTE — Discharge Instructions (Signed)
°Post Anesthesia Home Care Instructions ° °Activity: °Get plenty of rest for the remainder of the day. A responsible adult should stay with you for 24 hours following the procedure.  °For the next 24 hours, DO NOT: °-Drive a car °-Operate machinery °-Drink alcoholic beverages °-Take any medication unless instructed by your physician °-Make any legal decisions or sign important papers. ° °Meals: °Start with liquid foods such as gelatin or soup. Progress to regular foods as tolerated. Avoid greasy, spicy, heavy foods. If nausea and/or vomiting occur, drink only clear liquids until the nausea and/or vomiting subsides. Call your physician if vomiting continues. ° °Special Instructions/Symptoms: °Your throat may feel dry or sore from the anesthesia or the breathing tube placed in your throat during surgery. If this causes discomfort, gargle with warm salt water. The discomfort should disappear within 24 hours. ° ° ° ° ° ° ° °[facility] °Office Phone Number [phone] ° °BREAST BIOPSY/ PARTIAL MASTECTOMY: POST OP INSTRUCTIONS ° °Always review your discharge instruction sheet given to you by the facility where your surgery was performed. ° °IF YOU HAVE DISABILITY OR FAMILY LEAVE FORMS, YOU MUST BRING THEM TO THE OFFICE FOR PROCESSING.  DO NOT GIVE THEM TO YOUR DOCTOR. ° °1. A prescription for pain medication may be given to you upon discharge.  Take your pain medication as prescribed, if needed.  If narcotic pain medicine is not needed, then you may take acetaminophen (Tylenol) or ibuprofen (Advil) as needed. °2. Take your usually prescribed medications unless otherwise directed °3. If you need a refill on your pain medication, please contact your pharmacy.  They will contact our office to request authorization.  Prescriptions will not be filled after 5pm or on week-ends. °4. You should eat very light the first 24 hours after surgery, such as soup, crackers, pudding, etc.  Resume your normal diet the  day after surgery. °5. Most patients will experience some swelling and bruising in the breast.  Ice packs and a good support bra will help.  Swelling and bruising can take several days to resolve.  °6. It is common to experience some constipation if taking pain medication after surgery.  Increasing fluid intake and taking a stool softener will usually help or prevent this problem from occurring.  A mild laxative (Milk of Magnesia or Miralax) should be taken according to package directions if there are no bowel movements after 48 hours. °7. Unless discharge instructions indicate otherwise, you may remove your bandages 24-48 hours after surgery, and you may shower at that time.  You may have steri-strips (small skin tapes) in place directly over the incision.  These strips should be left on the skin for 7-10 days.  If your surgeon used skin glue on the incision, you may shower in 24 hours.  The glue will flake off over the next 2-3 weeks.  Any sutures or staples will be removed at the office during your follow-up visit. °8. ACTIVITIES:  You may resume regular daily activities (gradually increasing) beginning the next day.  Wearing a good support bra or sports bra minimizes pain and swelling.  You may have sexual intercourse when it is comfortable. °a. You may drive when you no longer are taking prescription pain medication, you can comfortably wear a seatbelt, and you can safely maneuver your car and apply brakes. °b. RETURN TO WORK:  ______________________________________________________________________________________ °9. You should see your doctor in the office for a follow-up appointment approximately two weeks after your surgery.  Your doctor’s nurse will   your follow-up appointment when she calls you with your pathology report.  Expect your pathology report 2-3 business days after your surgery.  You may call to check if you do not hear from [place] after three days. 10. OTHER INSTRUCTIONS:  _______________________________________________________________________________________________ _____________________________________________________________________________________________________________________________________ _____________________________________________________________________________________________________________________________________ _____________________________________________________________________________________________________________________________________  WHEN TO CALL YOUR DOCTOR: 1. Fever over 101.0 2. Nausea and/or vomiting. 3. Extreme swelling or bruising. 4. Continued bleeding from incision. 5. Increased pain, redness, or drainage from the incision.  The clinic staff is available to answer your questions during regular business hours.  Please dont hesitate to call and ask to speak to one of the nurses for clinical concerns.  If you have a medical emergency, go to the nearest emergency room or call 911.  A surgeon from [facility] is always on call at the hospital.  For further questions, please visit [email or web address]

## 2013-10-25 NOTE — Anesthesia Preprocedure Evaluation (Addendum)
Anesthesia Evaluation  Patient identified by MRN, date of birth, ID band Patient awake    Reviewed: Allergy & Precautions, H&P , NPO status , Patient's Chart, lab work & pertinent test results  Airway Mallampati: I TM Distance: >3 FB Neck ROM: Full    Dental   Pulmonary former smoker,          Cardiovascular hypertension, Pt. on medications     Neuro/Psych    GI/Hepatic GERD-  Medicated and Controlled,  Endo/Other    Renal/GU      Musculoskeletal   Abdominal   Peds  Hematology   Anesthesia Other Findings   Reproductive/Obstetrics                           Anesthesia Physical Anesthesia Plan  ASA: II  Anesthesia Plan: General   Post-op Pain Management:    Induction: Intravenous  Airway Management Planned: LMA  Additional Equipment:   Intra-op Plan:   Post-operative Plan: Extubation in OR  Informed Consent: I have reviewed the patients History and Physical, chart, labs and discussed the procedure including the risks, benefits and alternatives for the proposed anesthesia with the patient or authorized representative who has indicated his/her understanding and acceptance.     Plan Discussed with: CRNA and Surgeon  Anesthesia Plan Comments:         Anesthesia Quick Evaluation  

## 2013-10-25 NOTE — Op Note (Signed)
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 Final pathology revealed close margins with the inferior lateral margin being 1 mm in the superior margin 1.5 mm. After discussion of options we have elected to proceed with reexcision of her close margins. I discussed the nature and indications of the procedure as well as risks of general anesthesia, bleeding, infection and thromboembolic risks. She is now brought to the operating room for this procedure.   Procedure Detail:  Patient is brought to the operating room, placed in the supine position on the operating table and laryngeal mask general anesthesia induced. The left breast was widely sterilely prepped and draped. She received preoperative IV antibiotics. Patient timeout was performed and correct procedure verified. The previous circumareolar incision was sharply opened and dissection carried down to the subcutaneous tissue. Previous suture material was removed and the entire lumpectomy cavity was widely opened and exposed. I then excised the lateral, inferior, and the superior margins completely to a depth of at least 5 mm. Each margin was marked with a suture to orient. Hemostasis was obtained with 3-0 Vicryl and cautery. The new margins were marked with clips. The deep breast the subcutaneous tissue was closed with interrupted 3-0 Vicryl the skin with subcuticular 4-0 Monocryl and Dermabond. Sponge needle and instrument counts were correct.   Estimated Blood Loss:  Minimal         Drains: none  Blood Given: none          Specimens: further inferior, lateral, and superior margins submitted separately and  oriented        Complications:  * No complications entered in OR log *         Disposition: PACU - hemodynamically stable.         Condition: stable

## 2013-10-25 NOTE — Telephone Encounter (Signed)
[facility] calling to request pain medication Rx for pt.  Norco 5/325 #20 written by Dr. Excell Seltzer and placed at check in for p/u.  Family is aware.

## 2013-10-25 NOTE — Anesthesia Procedure Notes (Signed)
Procedure Name: LMA Insertion Date/Time: 10/25/2013 1:01 PM Performed by: Melynda Ripple D Pre-anesthesia Checklist: Patient identified, Emergency Drugs available, Suction available and Patient being monitored Patient Re-evaluated:Patient Re-evaluated prior to inductionOxygen Delivery Method: Circle System Utilized Preoxygenation: Pre-oxygenation with 100% oxygen Intubation Type: IV induction Ventilation: Mask ventilation without difficulty LMA: LMA inserted LMA Size: 4.0 Number of attempts: 1 Airway Equipment and Method: bite block Placement Confirmation: positive ETCO2 Tube secured with: Tape Dental Injury: Teeth and Oropharynx as per pre-operative assessment

## 2013-10-26 ENCOUNTER — Encounter (HOSPITAL_BASED_OUTPATIENT_CLINIC_OR_DEPARTMENT_OTHER): Payer: Self-pay | Admitting: General Surgery

## 2013-10-26 ENCOUNTER — Ambulatory Visit: Payer: BC Managed Care – PPO | Admitting: Oncology

## 2013-10-26 LAB — POCT HEMOGLOBIN-HEMACUE: Hemoglobin: 15.5 g/dL — ABNORMAL HIGH (ref 12.0–15.0)

## 2013-10-28 ENCOUNTER — Ambulatory Visit: Payer: BC Managed Care – PPO | Admitting: Radiation Oncology

## 2013-10-28 ENCOUNTER — Ambulatory Visit: Payer: BC Managed Care – PPO

## 2013-10-29 ENCOUNTER — Telehealth (INDEPENDENT_AMBULATORY_CARE_PROVIDER_SITE_OTHER): Payer: Self-pay | Admitting: General Surgery

## 2013-10-29 ENCOUNTER — Encounter (INDEPENDENT_AMBULATORY_CARE_PROVIDER_SITE_OTHER): Payer: BC Managed Care – PPO | Admitting: General Surgery

## 2013-10-29 NOTE — Telephone Encounter (Signed)
Called the patient and discussed the pathology report 

## 2013-11-09 ENCOUNTER — Encounter: Payer: Self-pay | Admitting: Dietician

## 2013-11-09 NOTE — Progress Notes (Signed)
Breast Cancer Nutrition Class Attendance Note  Date: 11/09/2013  Time: 1800  Pt attended [facility]'s Breast Cancer Nutrition Class, "Food For Your Fight". Pt was educated on basic cancer nutrition principles, including plant based diet and principles from [employer]  [employer] for [employer]) about latest nutrition findings and recommendations. Questions answered. Handouts and recipes provided.   Wendelin Bradt A. Jimmye Norman, RD, LDN Pager: [phone]

## 2013-11-10 ENCOUNTER — Encounter: Payer: Self-pay | Admitting: *Deleted

## 2013-11-10 NOTE — Progress Notes (Signed)
Location of Breast Cancer:  Left Breast, Ductal Carcinoma In-Situ. Lower Outer Quadrant   Histology per Pathology Report: Location of Breast Cancer:  10/25/13 Diagnosis 1. Breast, excision, Left lateral margin - FIBROCYSTIC CHANGES WITH CALCIFICATIONS. - HEALING BIOPSY SITE. - THERE IS NO EVIDENCE OF MALIGNANCY. - SEE COMMENT. 2. Breast, excision, Left inferior margin - FIBROCYSTIC CHANGE WITH CALCIFICATIONS. - VASCULAR CALCIFICATIONS. - HEALING BIOPSY SITE. - THERE IS NO EVIDENCE OF MALIGNANCY. - SEE COMMENT. 3. Breast, excision, Left superior margin - FIBROCYSTIC CHANGES WITH CALCIFICATIONS. - HEALING BIOPSY SITE. - THERE IS NO EVIDENCE OF MALIGNANCY. - SEE COMMENT. Microscopic  10/07/13 Diagnosis 1. Breast, lumpectomy, Left - DUCTAL CARCINOMA IN SITU. - IN SITU CARCINOMA IS 1 MM FROM NEAREST INFERIOR-LATERAL MARGIN. - IN SITU CARCINOMA IS 1.5 MM FROM THE NEAREST SUPERIOR MARGIN. - SEE TUMOR SYNOPTIC TEMPLATE BELOW.  1/69/67 Diagnosis Breast, left, needle core biopsy, 6 o'clock - DUCTAL CARCINOMA IN SITU WITH CALCIFICATIONS  Receptor Status: ER(100%), PR (0%), Her2-neu ( )  Did patient present with symptoms (if so, please note symptoms) or was this found on screening mammography?:   Past/Anticipated interventions by surgeon, if any: Biopsy Left Breast and  Excision of Superior, Inferior,and lateral margins  Past/Anticipated interventions by medical oncology, if any: Chemotherapy Unknown :  First Visit 10/26/13.  Next Visit 11/17/13.   Will be placed on Tamoxifen after radiation therapy  Lymphedema issues, if any:   Pain issues, if any:   SAFETY ISSUES:  Prior radiation? No  Pacemaker/ICD? No  Possible current pregnancy?  No  Is the patient on methotrexate? No  Current Complaints / other details:  Tenderness in left breast with occassional "shooting" pains.  Seen by Dr. Excell Seltzer today and received a "good report".

## 2013-11-11 ENCOUNTER — Ambulatory Visit
Admission: RE | Admit: 2013-11-11 | Discharge: 2013-11-11 | Disposition: A | Discharge status: Home / Self Care | Payer: BC Managed Care – PPO | Source: Ambulatory Visit

## 2013-11-11 ENCOUNTER — Encounter (INDEPENDENT_AMBULATORY_CARE_PROVIDER_SITE_OTHER): Payer: Self-pay | Admitting: General Surgery

## 2013-11-11 ENCOUNTER — Ambulatory Visit (INDEPENDENT_AMBULATORY_CARE_PROVIDER_SITE_OTHER): Payer: BC Managed Care – PPO | Admitting: General Surgery

## 2013-11-11 ENCOUNTER — Ambulatory Visit
Admission: RE | Admit: 2013-11-11 | Discharge: 2013-11-11 | Disposition: A | Discharge status: Home / Self Care | Payer: BC Managed Care – PPO | Source: Ambulatory Visit | Attending: Radiation Oncology | Admitting: Radiation Oncology

## 2013-11-11 ENCOUNTER — Encounter: Payer: Self-pay | Admitting: *Deleted

## 2013-11-11 VITALS — BP 120/76 | HR 68 | Temp 97.2°F | Resp 14 | Ht 63.0 in | Wt 183.2 lb

## 2013-11-11 DIAGNOSIS — D0590 Unspecified type of carcinoma in situ of unspecified breast: Secondary | ICD-10-CM | POA: Insufficient documentation

## 2013-11-11 DIAGNOSIS — C50512 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: Secondary | ICD-10-CM

## 2013-11-11 DIAGNOSIS — Z79899 Other long term (current) drug therapy: Secondary | ICD-10-CM | POA: Insufficient documentation

## 2013-11-11 DIAGNOSIS — Z09 Encounter for follow-up examination after completed treatment for conditions other than malignant neoplasm: Secondary | ICD-10-CM

## 2013-11-11 NOTE — Progress Notes (Signed)
History: Patient returns for followup after reexcision of her left breast lumpectomy for multiple close margins. She reports no problems from surgery.  Exam: BP 120/76  Pulse 68  Temp(Src) 97.2 F (36.2 C) (Temporal)  Resp 14  Ht 5\' 3"  (1.6 m)  Wt 183 lb 3.2 oz (83.099 kg)  BMI 32.46 kg/m2 Breasts: Left circumareolar incision is healing well without complication  Pathology: Further lateral inferior and superior margins showed only fibrocystic change with no evidence of malignancy.  Assessment plan: Doing well. No further surgery required. She is to see radiation and medical oncology today. Return in 6 months for long-term followup.

## 2013-11-11 NOTE — Addendum Note (Signed)
Encounter addended by: Deirdre Evener, RN on: 11/11/2013  7:25 PM<BR>     Documentation filed: Charges VN

## 2013-11-11 NOTE — Progress Notes (Signed)
   Department of Radiation Oncology  Phone:  [phone] Fax:        [phone]   Name: Katie Becker MRN: [id number]  DOB: Sep 21, 1951  Date: 11/11/2013  Follow Up Visit Note  Diagnosis: DCIS of the left breast  Interval History: Katie Becker presents today for routine followup.  She had her surgery in December and had multiple close margins. She went back for reexcision on January 5 and now has negative margins. She had about an 8 mm area of DCIS which was ER positive at 100%. She has met with Dr. Humphrey Rolls will be placed on tamoxifen. She is accompanied by her sister today. She is healed up well from her surgery. She is ready to start radiation.  Allergies:  Allergies  Allergen Reactions  . Penicillins Anaphylaxis  . Erythromycin Nausea And Vomiting  . Adhesive [Tape]     rash  . Amoxicillin     REACTION: unspecified  . Shellfish Allergy     welts  . Sulfamethoxazole Hives    REACTION: unspecified    Medications:  Current Outpatient Prescriptions  Medication Sig Dispense Refill  . atorvastatin (LIPITOR) 40 MG tablet Take 40 mg by mouth at bedtime.       . Calcium Carbonate-Vitamin D (CALCIUM + D PO) Take 1 tablet by mouth 2 (two) times daily before a meal.       . cetirizine (ZYRTEC) 10 MG tablet Take 10 mg by mouth daily as needed for allergies.      Marland Kitchen esomeprazole (NEXIUM) 40 MG capsule Take 40 mg by mouth at bedtime.       . hydrochlorothiazide (HYDRODIURIL) 25 MG tablet Take 25 mg by mouth at bedtime.       . Multiple Vitamins-Minerals (WOMENS MULTIVITAMIN PLUS PO) Take 1 tablet by mouth daily.       Marland Kitchen pyridOXINE (VITAMIN B-6) 100 MG tablet Take 100 mg by mouth daily.       No current facility-administered medications for this encounter.    Physical Exam:  There were no vitals filed for this visit. pleasant female in no distress sitting comfortably on examining table. Left lumpectomy incision is healing well.  IMPRESSION: Katie Becker is a 63 y.o. female status post lumpectomy for  DCIS of the left breast now ready for radiation  PLAN:  I discussed with the patient and her sister today the options for treatment. He discussed the role of radiation and decreasing local failures in patients who undergo lumpectomy. We discussed 4-6 weeks of treatment as an outpatient. I discussed with her the process of simulation the placement tattoos. We discussed use of breath hold technique for cardiac sparing. We discussed skin redness darkness as well as fatigue as possible side effects. She has signed informed consent and agree to proceed forward. I scheduled her for simulation next week at her request.    Thea Silversmith, MD

## 2013-11-14 ENCOUNTER — Encounter: Payer: Self-pay | Admitting: Internal Medicine

## 2013-11-16 ENCOUNTER — Ambulatory Visit: Payer: BC Managed Care – PPO | Admitting: Oncology

## 2013-11-17 ENCOUNTER — Encounter: Payer: Self-pay | Admitting: Oncology

## 2013-11-17 ENCOUNTER — Ambulatory Visit (HOSPITAL_BASED_OUTPATIENT_CLINIC_OR_DEPARTMENT_OTHER): Payer: BC Managed Care – PPO | Admitting: Oncology

## 2013-11-17 VITALS — BP 144/79 | HR 80 | Temp 97.7°F | Resp 18 | Ht 63.0 in | Wt 180.0 lb

## 2013-11-17 DIAGNOSIS — R21 Rash and other nonspecific skin eruption: Secondary | ICD-10-CM

## 2013-11-17 DIAGNOSIS — L919 Hypertrophic disorder of the skin, unspecified: Secondary | ICD-10-CM

## 2013-11-17 DIAGNOSIS — C50512 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: Secondary | ICD-10-CM

## 2013-11-17 DIAGNOSIS — Z170 Estrogen receptor positive status [ER+]: Secondary | ICD-10-CM

## 2013-11-17 DIAGNOSIS — L909 Atrophic disorder of skin, unspecified: Secondary | ICD-10-CM

## 2013-11-17 DIAGNOSIS — D0590 Unspecified type of carcinoma in situ of unspecified breast: Secondary | ICD-10-CM

## 2013-11-17 NOTE — Telephone Encounter (Signed)
appts made and printed. Pt will call to get her an appt for derm appt due to its not part of her ov today....td

## 2013-11-17 NOTE — Patient Instructions (Signed)

## 2013-11-18 ENCOUNTER — Ambulatory Visit
Admission: RE | Admit: 2013-11-18 | Discharge: 2013-11-18 | Disposition: A | Discharge status: Home / Self Care | Payer: BC Managed Care – PPO | Source: Ambulatory Visit | Attending: Radiation Oncology | Admitting: Radiation Oncology

## 2013-11-18 DIAGNOSIS — Z510 Encounter for antineoplastic radiation therapy: Secondary | ICD-10-CM | POA: Insufficient documentation

## 2013-11-18 DIAGNOSIS — C50512 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: Secondary | ICD-10-CM

## 2013-11-18 DIAGNOSIS — C50919 Malignant neoplasm of unspecified site of unspecified female breast: Secondary | ICD-10-CM | POA: Insufficient documentation

## 2013-11-18 NOTE — Progress Notes (Signed)
Radiation Oncology         [phone] ________________________________  Name: Katie Becker      MRN: [id number]          Date: 11/18/2013              DOB: 23-Nov-1950  Optical Surface Tracking Plan:  Since intensity modulated radiotherapy (IMRT) and 3D conformal radiation treatment methods are predicated on accurate and precise positioning for treatment, intrafraction motion monitoring is medically necessary to ensure accurate and safe treatment delivery.  The ability to quantify intrafraction motion without excessive ionizing radiation dose can only be performed with optical surface tracking. Accordingly, surface imaging offers the opportunity to obtain 3D measurements of patient position throughout IMRT and 3D treatments without excessive radiation exposure.  I am ordering optical surface tracking for this patient's upcoming course of radiotherapy. ________________________________ Signature   Reference:   Ursula Alert, J, et al. Surface imaging-based analysis of intrafraction motion for breast radiotherapy patients.Journal of [place], n. 6, nov. 2014. ISSN [id number].   Available at: <http://www.jacmp.org/index.php/jacmp/article/view/4957>.

## 2013-11-18 NOTE — Progress Notes (Signed)
Name: Katie Becker   MRN: [id number]  Date:  11/18/2013  DOB: 04/08/1951  Status:outpatient   DIAGNOSIS: Left Breast cancer.  CONSENT VERIFIED: yes SET UP: Patient is setup supine  IMMOBILIZATION:  The following immobilization was used:Custom Moldable Pillow, breast board.  NARRATIVE: [name] was brought to the [place].  Identity was confirmed.  All relevant records and images related to the planned course of therapy were reviewed.  Then, the patient was positioned in a stable reproducible clinical set-up for radiation therapy.  Wires were placed to delineate the clinical extent of breast tissue. A wire was placed on the scar as well.  CT images were obtained.  An isocenter was placed. Skin markings were placed.  The position of the heart was then analyzed.  Due to the proximity of the heart to the chest wall, I felt she would benefit from deep inspiration breath hold for cardiac sparing.  She was then coached and rescanned in the breath hold position.  Acceptable cardiac sparing was achieved. The CT images were loaded into the planning software where the target and avoidance structures were contoured.  The radiation prescription was entered and confirmed. The patient was discharged in stable condition and tolerated simulation well.    TREATMENT PLANNING NOTE/3D Simulation Note Treatment planning then occurred. I have requested : MLC's, isodose plan, basic dose calculation  3D simulation was performed.  I personally designed and supervised the construction of 3 medically necessary complex treatment devices in the form of MLCs which will be used for beam modification and to protect critical structures including the heart and lung as well as the immobilization device which is necessary for reproducible set up.  I have requested a dose volume histogram of the heart, lung and tumor cavity.    RESPIRATORY MOTION MANAGEMENT SIMULATION - Deep Inspiration Breath Hold  NARRATIVE:   In order to account for effect of respiratory motion on target structures and other organs in the planning and delivery of radiotherapy, this patient underwent respiratory motion management simulation.  To accomplish this, when the patient was brought to the CT simulation planning suite, a bellows was placed on the her abdomen.  Wave forms of the patient's breathing were obtained. Coaching was performed and practice sessions initiated to monitor her ability to obtain and maintain deep inspiration breath hold.  The CT images were loaded into the planning software and fused with her free breathing images by physics.  Acceptable cardiac sparing was achieved through the use of deep inspiration breath hold.  Planning will be performed on her breath hold scan

## 2013-11-18 NOTE — Addendum Note (Signed)
Encounter addended by: Deirdre Evener, RN on: 11/18/2013  4:35 PM<BR>     Documentation filed: Inpatient Patient Education

## 2013-11-18 NOTE — Telephone Encounter (Signed)
sw pt informed her that her appts for 02/2014 was cancel and rs to 12/28/13@ 1pm. Pt is aware...td

## 2013-11-21 NOTE — Progress Notes (Signed)
OFFICE PROGRESS NOTE  CC Dr. Marland Kitchen [address] 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  STAGE:  Breast cancer of lower-outer quadrant of left female breast  Primary site: Breast (Left)  Staging method: AJCC 7th Edition  Clinical: Stage 0 (Tis (DCIS), N0, cM0)  Summary: Stage 0 (Tis (DCIS), N0, cM0)   PRIOR THERAPY:  #1 patient presented for a screening mammogram and was found to have left breast calcifications at the 6:00 position measuring 2.8 cm. Biopsy showed DCIS that was ER positive PR negative.   #2 patient is status post lumpectomy performed on 10/07/2013 the pathology revealed: Diagnosis 1. Breast, lumpectomy, Left - DUCTAL CARCINOMA IN SITU. - IN SITU CARCINOMA IS 1 MM FROM NEAREST INFERIOR-LATERAL MARGIN. - IN SITU CARCINOMA IS 1.5 MM FROM THE NEAREST SUPERIOR MARGIN. - SEE TUMOR SYNOPTIC TEMPLATE BELOW. 2. Skin , Multiple skin tags from bilateral axillas - MULTIPLE BENIGN FIBROEPITHELIAL POLYPS (SKIN TAGS).ER positive PR positive  #3 status post reexcision of positive margins on 10/25/2013. Final margins were all negative for DCIS.  CURRENT THERAPY: proceed with radiation therapy  INTERVAL HISTORY: Dante Gang 63 y.o. female returns for followup visit today. Clinically she seems to be doing well. She is recovered very nicely from her recent surgery in December and then in January. She is a little tender but otherwise no other complaints there is no signs of infections. She denies any headaches double vision blurring of vision. She has no aches or pains no vaginal bleeding or discharge. Remainder of the 10 point review of systems is negative. Today she was accompanied by her younger sister.  MEDICAL HISTORY: Past Medical History  Diagnosis Date  . DIVERTICULOSIS, COLON 04/20/2007  . GERD  04/20/2007  . HEPATOMEGALY, HX OF 12/21/2009  . HYPERLIPIDEMIA 04/20/2007  . HYPERTENSION 04/20/2007  . LOW BACK PAIN 08/16/2008  . MENOPAUSAL SYNDROME 04/20/2007  . PEPTIC ULCER DISEASE 09/06/2010  . H/O hiatal hernia   . Arthritis     back  . Wears glasses   . Heart murmur     told by PCP- years ago-never had echo  . Breast cancer     Left Breast - ductal Carcinoma In-Situ - diagnosed on MRI- biopsy    ALLERGIES:  is allergic to penicillins; erythromycin; adhesive; amoxicillin; shellfish allergy; and sulfamethoxazole.  MEDICATIONS:  Current Outpatient Prescriptions  Medication Sig Dispense Refill  . atorvastatin (LIPITOR) 40 MG tablet Take 40 mg by mouth at bedtime.       . Calcium Carbonate-Vitamin D (CALCIUM + D PO) Take 1 tablet by mouth 2 (two) times daily before a meal.       . cetirizine (ZYRTEC) 10 MG tablet Take 10 mg by mouth daily as needed for allergies.      Marland Kitchen esomeprazole (NEXIUM) 40 MG capsule Take 40 mg by mouth at bedtime.       . hydrochlorothiazide (HYDRODIURIL) 25 MG tablet Take 25 mg by mouth at bedtime.       . Multiple Vitamins-Minerals (WOMENS MULTIVITAMIN PLUS PO) Take 1 tablet by mouth daily.       Marland Kitchen pyridOXINE (VITAMIN B-6) 100 MG tablet Take 100 mg by mouth daily.      . vitamin E 400 UNIT capsule Take 400 Units by mouth daily.       No current facility-administered medications for this visit.  SURGICAL HISTORY:  Past Surgical History  Procedure Laterality Date  . Cholecystectomy    . Abdominal hysterectomy    . Tubal ligation    . Carpal tunnel release Bilateral   . Breast lumpectomy with needle localization Left 10/07/2013    Procedure: BREAST LUMPECTOMY WITH NEEDLE LOCALIZATION EXCISION OF BILATERAL AXILLARY SKIN TAGS ;  Surgeon: Edward Jolly, MD;  Location: [place];  Service: General;  Laterality: Left;  . Re-excision of breast lumpectomy Left 10/25/2013    Procedure: RE-EXCISION OF BREAST LUMPECTOMY;  Surgeon: Edward Jolly, MD;   Location: [place];  Service: General;  Laterality: Left;  clean wound class  . Breast surgery      REVIEW OF SYSTEMS:  Pertinent items are noted in HPI.     PHYSICAL EXAMINATION: Blood pressure 144/79, pulse 80, temperature 97.7 F (36.5 C), temperature source Oral, resp. rate 18, height 5\' 3"  (1.6 m), weight 180 lb (81.647 kg). Body mass index is 31.89 kg/(m^2). ECOG PERFORMANCE STATUS: 0 - Asymptomatic  HEENT exam: EOMI PERRLA sclerae anicteric no conjunctival pallor oral mucosa is moist neck is supple no palpable adenopathy Lungs: Clear to auscultation and percussion Cardiovascular: No murmurs rubs regular rate rhythm Abdomen: Soft nontender nondistended bowel sounds are present no hepatosplenomegaly Extremities: No clubbing edema or cyanosis Neuro: Alert oriented x3 DTRs are +4 motor and sensory is intact strength is symmetrical in upper and lower extremities gait is normal Skin: No rashes Breasts: breasts appear normal, no suspicious masses, no skin or nipple changes or axillary nodes, left breast with healing surgical scar   LABORATORY DATA: Lab Results  Component Value Date   WBC 7.6 10/01/2013   HGB 15.5* 10/25/2013   HCT 43.4 10/01/2013   MCV 84.9 10/01/2013   PLT 228 10/01/2013      Chemistry      Component Value Date/Time   NA 141 10/01/2013 1108   NA 143 09/01/2013 0803   K 3.3* 10/01/2013 1108   K 3.5 09/01/2013 0803   CL 102 10/01/2013 1108   CO2 28 10/01/2013 1108   CO2 28 09/01/2013 0803   BUN 13 10/01/2013 1108   BUN 13.1 09/01/2013 0803   CREATININE 0.84 10/01/2013 1108   CREATININE 0.9 09/01/2013 0803      Component Value Date/Time   CALCIUM 9.6 10/01/2013 1108   CALCIUM 10.0 09/01/2013 0803   ALKPHOS 106 09/01/2013 0803   ALKPHOS 74 09/07/2012 0908   AST 33 09/01/2013 0803   AST 98* 09/07/2012 0908   ALT 40 09/01/2013 0803   ALT 82* 09/07/2012 0908   BILITOT 0.78 09/01/2013 0803   BILITOT 0.8 09/07/2012 0908        RADIOGRAPHIC STUDIES:  No results found.  ASSESSMENT/PLAN: 63 year old female with  #1 stage 0 (Tis NX) DCIS of the left breast status post lumpectomy with the final pathology revealing DCIS ER positive PR positive. Postoperatively patient is doing well without any significant problems. Initially she did have positive margins. However on 10/25/2013 she underwent reexcision and final margins were negative.  #2 patient is recommended to proceed with adjuvant radiation therapy. She will be referred to Dr. Thea Silversmith.  #3 we discussed adjuvant antiestrogen therapy with tamoxifen 20 mg daily. We discussed risks benefits and side effects. This will start after the radiation is completed. Duration of therapy is 5 years  #4 followup: Patient will be seen back after completion of radiation.   All questions were answered. The patient knows to call the  clinic with any problems, questions or concerns. We can certainly see the patient much sooner if necessary.  I spent 20 minutes counseling the patient face to face. The total time spent in the appointment was 30 minutes.  Marcy Panning, MD Medical/Oncology [facility] [phone] (beeper) [phone] (Office)

## 2013-11-25 ENCOUNTER — Ambulatory Visit
Admission: RE | Admit: 2013-11-25 | Discharge: 2013-11-25 | Disposition: A | Discharge status: Home / Self Care | Payer: BC Managed Care – PPO | Source: Ambulatory Visit | Attending: Radiation Oncology | Admitting: Radiation Oncology

## 2013-11-25 DIAGNOSIS — C50512 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: Secondary | ICD-10-CM

## 2013-11-26 NOTE — Progress Notes (Signed)
  Radiation Oncology         [phone] ________________________________  Name: Katie Becker MRN: [id number]  Date: 11/25/2013  DOB: 26-Jan-1951  Simulation Verification Note  Status: outpatient  NARRATIVE: The patient was brought to the treatment unit and placed in the planned treatment position. The clinical setup was verified. Then port films were obtained and uploaded to the radiation oncology medical record software.  The treatment beams were carefully compared against the planned radiation fields. The position location and shape of the radiation fields was reviewed. The targeted volume of tissue appears appropriately covered by the radiation beams. Organs at risk appear to be excluded as planned.  Based on my personal review, I approved the simulation verification. The patient's treatment will proceed as planned.  ------------------------------------------------  Thea Silversmith, MD

## 2013-11-29 ENCOUNTER — Ambulatory Visit
Admission: RE | Admit: 2013-11-29 | Discharge: 2013-11-29 | Disposition: A | Discharge status: Home / Self Care | Payer: BC Managed Care – PPO | Source: Ambulatory Visit | Attending: Radiation Oncology | Admitting: Radiation Oncology

## 2013-11-29 DIAGNOSIS — C50512 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: Secondary | ICD-10-CM

## 2013-11-29 MED ORDER — RADIAPLEXRX EX GEL
Freq: Once | CUTANEOUS | Status: AC
  Administered 2013-11-29: 19:00:00 via TOPICAL

## 2013-11-29 NOTE — Progress Notes (Signed)
Katie Becker here with her sister for post sim ed.  She was given the Radiation Therapy and You and discussed potential side effects/management including pain, fatigue and skin changes.  She was also given the skin care handout.  She was given radiaplex gel and was instructed to apply it twice a day after treatment and at bedtime.  She was educated about put day with Dr. Pablo Ledger on Tuesday's.  She was advised to contact nursing with any questions or concerns.

## 2013-11-30 ENCOUNTER — Ambulatory Visit
Admission: RE | Admit: 2013-11-30 | Discharge: 2013-11-30 | Disposition: A | Discharge status: Home / Self Care | Payer: BC Managed Care – PPO | Source: Ambulatory Visit | Attending: Radiation Oncology | Admitting: Radiation Oncology

## 2013-11-30 VITALS — BP 147/67 | HR 87 | Temp 97.7°F | Ht 63.0 in | Wt 181.9 lb

## 2013-11-30 DIAGNOSIS — C50512 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: Secondary | ICD-10-CM

## 2013-11-30 NOTE — Progress Notes (Signed)
Weekly Management Note Current Dose: 5.34  Gy  Projected Dose: 52.72 Gy   Narrative:  The patient presents for routine under treatment assessment.  CBCT/MVCT images/Port film x-rays were reviewed.  The chart was checked. Doing well. Breast feels "warm" to patient after treatment.   Physical Findings: Weight: 181 lb 14.4 oz (82.509 kg). Unchanged  Impression:  The patient is tolerating radiation.  Plan:  Continue treatment as planned. Continue radiaplex.

## 2013-11-30 NOTE — Progress Notes (Signed)
Katie Becker has had 2 fractions to her left breast.  She denies pain.  She reports fatigue.  She has also reports feeling heat in the treatment area after treatment.  The skin on her left breast is intact.  She is using radiaplex and pure aloe.

## 2013-12-01 ENCOUNTER — Ambulatory Visit
Admission: RE | Admit: 2013-12-01 | Discharge: 2013-12-01 | Disposition: A | Discharge status: Home / Self Care | Payer: BC Managed Care – PPO | Source: Ambulatory Visit | Attending: Radiation Oncology | Admitting: Radiation Oncology

## 2013-12-02 ENCOUNTER — Ambulatory Visit
Admission: RE | Admit: 2013-12-02 | Discharge: 2013-12-02 | Disposition: A | Discharge status: Home / Self Care | Payer: BC Managed Care – PPO | Source: Ambulatory Visit | Attending: Radiation Oncology | Admitting: Radiation Oncology

## 2013-12-03 ENCOUNTER — Ambulatory Visit
Admission: RE | Admit: 2013-12-03 | Discharge: 2013-12-03 | Disposition: A | Discharge status: Home / Self Care | Payer: BC Managed Care – PPO | Source: Ambulatory Visit | Attending: Radiation Oncology | Admitting: Radiation Oncology

## 2013-12-06 ENCOUNTER — Encounter: Payer: Self-pay | Admitting: Genetic Counselor

## 2013-12-06 ENCOUNTER — Other Ambulatory Visit: Payer: BC Managed Care – PPO

## 2013-12-06 ENCOUNTER — Ambulatory Visit (HOSPITAL_BASED_OUTPATIENT_CLINIC_OR_DEPARTMENT_OTHER): Payer: BC Managed Care – PPO | Admitting: Genetic Counselor

## 2013-12-06 ENCOUNTER — Ambulatory Visit
Admission: RE | Admit: 2013-12-06 | Discharge: 2013-12-06 | Disposition: A | Discharge status: Home / Self Care | Payer: BC Managed Care – PPO | Source: Ambulatory Visit | Attending: Radiation Oncology | Admitting: Radiation Oncology

## 2013-12-06 DIAGNOSIS — C50519 Malignant neoplasm of lower-outer quadrant of unspecified female breast: Secondary | ICD-10-CM

## 2013-12-06 DIAGNOSIS — Z803 Family history of malignant neoplasm of breast: Secondary | ICD-10-CM

## 2013-12-06 DIAGNOSIS — Z800 Family history of malignant neoplasm of digestive organs: Secondary | ICD-10-CM

## 2013-12-06 DIAGNOSIS — C50512 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: Secondary | ICD-10-CM

## 2013-12-06 NOTE — Progress Notes (Addendum)
Dr.  Marcy Panning requested a consultation for genetic counseling and risk assessment for Katie Becker, a 63 y.o. female, for discussion of her personal history of breast cancer and family history of colon and breast cancer.  She presents to clinic today to discuss the possibility of a genetic predisposition to cancer, and to further clarify her risks, as well as her family members' risks for cancer.   HISTORY OF PRESENT ILLNESS: In 2014, at the age of 63, Katie Becker was diagnosed with DCIS of the left breast. This was treated with lumpectomy x2.  She is scheduled for radiation and will use tamoxifen.  Her tumor is ER+/PR-.  Katie Becker had a breast biopsy nine years ago that was benign.  She had a colonoscopy 10 years ago that found diverticulitis, but no polyps.    Past Medical History  Diagnosis Date  . DIVERTICULOSIS, COLON 04/20/2007  . GERD 04/20/2007  . HEPATOMEGALY, HX OF 12/21/2009  . HYPERLIPIDEMIA 04/20/2007  . HYPERTENSION 04/20/2007  . LOW BACK PAIN 08/16/2008  . MENOPAUSAL SYNDROME 04/20/2007  . PEPTIC ULCER DISEASE 09/06/2010  . H/O hiatal hernia   . Arthritis     back  . Wears glasses   . Heart murmur     told by PCP- years ago-never had echo  . Breast cancer     Left Breast - ductal Carcinoma In-Situ - diagnosed on MRI- biopsy    Past Surgical History  Procedure Laterality Date  . Cholecystectomy    . Abdominal hysterectomy    . Tubal ligation    . Carpal tunnel release Bilateral   . Breast lumpectomy with needle localization Left 10/07/2013    Procedure: BREAST LUMPECTOMY WITH NEEDLE LOCALIZATION EXCISION OF BILATERAL AXILLARY SKIN TAGS ;  Surgeon: Edward Jolly, MD;  Location: [place];  Service: General;  Laterality: Left;  . Re-excision of breast lumpectomy Left 10/25/2013    Procedure: RE-EXCISION OF BREAST LUMPECTOMY;  Surgeon: Edward Jolly, MD;  Location: [place];  Service: General;  Laterality: Left;  clean wound class  . Breast surgery       History   Social History  . Marital Status: Widowed    Spouse Name: N/A    Number of Children: 2  . Years of Education: N/A   Occupational History  .  [employer]-Cust Serv  And  [employer]   Social History Main Topics  . Smoking status: Former Smoker    Quit date: 10/21/1978  . Smokeless tobacco: Never Used  . Alcohol Use: No  . Drug Use: No  . Sexual Activity: Not Currently   Other Topics Concern  . None   Social History Narrative  . None    REPRODUCTIVE HISTORY AND PERSONAL RISK ASSESSMENT FACTORS: Menarche was at age 84.   postmenopausal Uterus Intact: no Ovaries Intact: yes GXP2, first live birth at age 52  She has not previously undergone treatment for infertility.   Oral Contraceptive use: 6 years   She has used HRT in the past.    FAMILY HISTORY:  We obtained a detailed, 4-generation family history.  Significant diagnoses are listed below: Family History  Problem Relation Age of Onset  . Hypertension Mother   . Hyperlipidemia Mother   . Heart disease Mother     congestive heart failure  . Cancer Father     hx of melanoma  . Hypertension Sister   . Skin cancer Sister 14    basal cell carcinoma  .  Hypertension Brother   . Heart disease Maternal Grandmother 43  . Prostate cancer Cousin   . Breast cancer Maternal Aunt 60  . Colon cancer Paternal Grandmother 77  . Heart disease Maternal Aunt 42  The patient called to notify me that her maternal grandmother's sister had breast cancer and died with breast cancer at age 74.  Patient's maternal ancestors are of Caucasian and [place] descent, and paternal ancestors are of [place] descent. There is no reported Ashkenazi Jewish ancestry. There is no known consanguinity.  GENETIC COUNSELING ASSESSMENT: Katie Becker is a 63 y.o. female with a personal history of breast cancer and family history of breast and colon cancer which somewhat suggestive of a familial disease and predisposition to cancer.  We, therefore, discussed and recommended the following at today's visit.   DISCUSSION: We reviewed the characteristics, features and inheritance patterns of hereditary cancer syndromes. We also discussed genetic testing, including the appropriate family members to test, the process of testing, insurance coverage and turn-around-time for results. We discussed that based on her family history of one maternal aunt with breast cancer at 57 and a paternal grandmother with colon cancer at 8, Katie Becker does not meet the NCCN guidelines for genetic testing.  She feels that there may be other family members with breast cancer, and she will research this further.  We discussed that if she finds one more family member on her maternal side of the family with breast cancer, that is a 19st, 2nd or 3rd degree relative, then she would meet the criteria for testing.  PLAN: After considering the risks, benefits, and limitations, Katie Becker declined testing based on not meeting testing guidelines at this time. We encouraged Katie Becker to remain in contact with cancer genetics annually so that we can continuously update the family history and inform her of any changes in cancer genetics and testing that may be of benefit for her family. Katie Becker's questions were answered to her satisfaction today. Our contact information was provided should additional questions or concerns arise.  The patient was seen for a total of 45 minutes, greater than 50% of which was spent face-to-face counseling.  This note will also be sent to the referring provider via the electronic medical record. The patient will be supplied with a summary of this genetic counseling discussion as well as educational information on the discussed hereditary cancer syndromes following the conclusion of their visit.   Patient was discussed with Dr. Marcy Panning.   _______________________________________________________________________ For Office Staff:   Number of people involved in session: 2 Was an Intern/ student involved with case: yes

## 2013-12-07 ENCOUNTER — Ambulatory Visit
Admission: RE | Admit: 2013-12-07 | Discharge: 2013-12-07 | Disposition: A | Discharge status: Home / Self Care | Payer: BC Managed Care – PPO | Source: Ambulatory Visit | Attending: Radiation Oncology | Admitting: Radiation Oncology

## 2013-12-07 ENCOUNTER — Encounter: Payer: Self-pay | Admitting: Radiation Oncology

## 2013-12-07 VITALS — BP 115/71 | HR 91 | Resp 16 | Wt 182.0 lb

## 2013-12-07 DIAGNOSIS — C50512 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: Secondary | ICD-10-CM

## 2013-12-07 NOTE — Progress Notes (Signed)
Weekly Management Note Current Dose:  18.69 Gy  Projected Dose: 52.72 Gy   Narrative:  The patient presents for routine under treatment assessment.  CBCT/MVCT images/Port film x-rays were reviewed.  The chart was checked. Doing well. Fatigued.   Physical Findings: Weight: 182 lb (82.555 kg). Slight darkness of left breast.  Impression:  The patient is tolerating radiation.  Plan:  Continue treatment as planned. Continue radiaplex. She is exercising, eating right, etc.

## 2013-12-07 NOTE — Progress Notes (Signed)
No skin changes to left/treated breast noted. Reports using aloe vera each morning and radiaplex following treatment and at night before bed. Reports fatigue despite daily exercise and adequate rest. Denies pain at this time.

## 2013-12-08 ENCOUNTER — Ambulatory Visit
Admission: RE | Admit: 2013-12-08 | Discharge: 2013-12-08 | Disposition: A | Discharge status: Home / Self Care | Payer: BC Managed Care – PPO | Source: Ambulatory Visit | Attending: Radiation Oncology | Admitting: Radiation Oncology

## 2013-12-08 ENCOUNTER — Encounter: Payer: Self-pay | Admitting: Radiation Oncology

## 2013-12-08 NOTE — Progress Notes (Signed)
Name: JAKIYAH STEPNEY   MRN: [id number]  Date:  12/08/2013   DOB: [birth year]  Status:outpatient    DIAGNOSIS: Breast cancer.  CONSENT VERIFIED: yes   SET UP: Patient is setup supine   IMMOBILIZATION:  The following immobilization was used:Custom Moldable Pillow, breast board.   NARRATIVE: Dante Gang underwent complex simulation and treatment planning for her boost treatment today.  Her tumor volume was outlined on the planning CT scan.  Due to the depth of her cavity, electrons could not be used and a photon plan was developed. The plan will be prescribed to the 100%  isodose line.   I personally supervised and approved the creation of 3 unique MLCs comprising 3  treatment devices.    3D simulation was performed. I requested DVH of heart, lungs and lumpectomy cavity.   Optical guidance will be used as the treatment will be delivered with breath hold technique for cardiac sparing. AlignRT will be used to monitor the surface for delivery of treatment in breath hold.

## 2013-12-09 ENCOUNTER — Encounter: Payer: Self-pay | Admitting: Radiation Oncology

## 2013-12-09 ENCOUNTER — Ambulatory Visit: Payer: BC Managed Care – PPO

## 2013-12-09 ENCOUNTER — Ambulatory Visit
Admission: RE | Admit: 2013-12-09 | Discharge: 2013-12-09 | Disposition: A | Discharge status: Home / Self Care | Payer: BC Managed Care – PPO | Source: Ambulatory Visit | Attending: Radiation Oncology | Admitting: Radiation Oncology

## 2013-12-10 ENCOUNTER — Ambulatory Visit
Admission: RE | Admit: 2013-12-10 | Discharge: 2013-12-10 | Disposition: A | Discharge status: Home / Self Care | Payer: BC Managed Care – PPO | Source: Ambulatory Visit | Attending: Radiation Oncology | Admitting: Radiation Oncology

## 2013-12-13 ENCOUNTER — Ambulatory Visit
Admission: RE | Admit: 2013-12-13 | Discharge: 2013-12-13 | Disposition: A | Discharge status: Home / Self Care | Payer: BC Managed Care – PPO | Source: Ambulatory Visit | Attending: Radiation Oncology | Admitting: Radiation Oncology

## 2013-12-14 ENCOUNTER — Ambulatory Visit
Admission: RE | Admit: 2013-12-14 | Discharge: 2013-12-14 | Disposition: A | Discharge status: Home / Self Care | Payer: BC Managed Care – PPO | Source: Ambulatory Visit | Attending: Radiation Oncology | Admitting: Radiation Oncology

## 2013-12-14 VITALS — BP 140/78 | HR 88 | Temp 97.7°F | Wt 183.9 lb

## 2013-12-14 DIAGNOSIS — C50512 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: Secondary | ICD-10-CM

## 2013-12-14 NOTE — Progress Notes (Signed)
Weekly Management Note Current Dose:  32.04 Gy  Projected Dose: 52.72 Gy   Narrative:  The patient presents for routine under treatment assessment.  CBCT/MVCT images/Port film x-rays were reviewed.  The chart was checked.Doing well. No complaints. Using radiaplex BID  Physical Findings: Weight: 183 lb 14.4 oz (83.416 kg). Unchanged. Slightly dark. No desquamation.   Impression:  The patient is tolerating radiation.  Plan:  Continue treatment as planned. Continue radiaplex.

## 2013-12-14 NOTE — Progress Notes (Signed)
Patient here for routine weekly assessment of radiation to left breast.Completed 12 of 16 treatments.Skin is moderately tanned.Has some tenderness to touch.Mild fatigue.

## 2013-12-15 ENCOUNTER — Ambulatory Visit
Admission: RE | Admit: 2013-12-15 | Discharge: 2013-12-15 | Disposition: A | Discharge status: Home / Self Care | Payer: BC Managed Care – PPO | Source: Ambulatory Visit | Attending: Radiation Oncology | Admitting: Radiation Oncology

## 2013-12-15 ENCOUNTER — Ambulatory Visit: Payer: BC Managed Care – PPO

## 2013-12-16 ENCOUNTER — Ambulatory Visit
Admission: RE | Admit: 2013-12-16 | Discharge: 2013-12-16 | Disposition: A | Discharge status: Home / Self Care | Payer: BC Managed Care – PPO | Source: Ambulatory Visit | Attending: Radiation Oncology | Admitting: Radiation Oncology

## 2013-12-17 ENCOUNTER — Ambulatory Visit
Admission: RE | Admit: 2013-12-17 | Discharge: 2013-12-17 | Disposition: A | Discharge status: Home / Self Care | Payer: BC Managed Care – PPO | Source: Ambulatory Visit | Attending: Radiation Oncology | Admitting: Radiation Oncology

## 2013-12-20 ENCOUNTER — Ambulatory Visit
Admission: RE | Admit: 2013-12-20 | Discharge: 2013-12-20 | Disposition: A | Discharge status: Home / Self Care | Payer: BC Managed Care – PPO | Source: Ambulatory Visit | Attending: Radiation Oncology | Admitting: Radiation Oncology

## 2013-12-21 ENCOUNTER — Ambulatory Visit
Admission: RE | Admit: 2013-12-21 | Discharge: 2013-12-21 | Disposition: A | Discharge status: Home / Self Care | Payer: BC Managed Care – PPO | Source: Ambulatory Visit | Attending: Radiation Oncology | Admitting: Radiation Oncology

## 2013-12-21 ENCOUNTER — Encounter: Payer: Self-pay | Admitting: Radiation Oncology

## 2013-12-21 ENCOUNTER — Ambulatory Visit: Payer: BC Managed Care – PPO

## 2013-12-21 VITALS — BP 136/71 | HR 78 | Temp 97.7°F | Ht 63.0 in | Wt 184.7 lb

## 2013-12-21 DIAGNOSIS — C50512 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: Secondary | ICD-10-CM

## 2013-12-21 NOTE — Progress Notes (Signed)
Ms. Katie Becker has received her 1st boost treatment today.  Note bright erythema with a rash like appearance of the upper portion of her breast..  Skin remains intact.

## 2013-12-22 ENCOUNTER — Ambulatory Visit: Payer: BC Managed Care – PPO

## 2013-12-22 ENCOUNTER — Ambulatory Visit
Admission: RE | Admit: 2013-12-22 | Discharge: 2013-12-22 | Disposition: A | Discharge status: Home / Self Care | Payer: BC Managed Care – PPO | Source: Ambulatory Visit | Attending: Radiation Oncology | Admitting: Radiation Oncology

## 2013-12-22 NOTE — Progress Notes (Signed)
Weekly Management Note (seen on 3/3) Current Dose: 44.72  Gy  Projected Dose: 52.72  Gy   Narrative:  The patient presents for routine under treatment assessment.  CBCT/MVCT images/Port film x-rays were reviewed.  The chart was checked. Doing well. Using radiaples for itching medially. Appt with med onc next week.   Physical Findings: Weight: 184 lb 11.2 oz (83.779 kg). Unchanged. Pink breast. No desquamation.   Impression:  The patient is tolerating radiation.  Plan:  Continue treatment as planned. Continue RT.

## 2013-12-22 NOTE — Progress Notes (Signed)
  Radiation Oncology         [phone] ________________________________  Name: Katie Becker MRN: [id number]  Date: 12/21/2013  DOB: Nov 26, 1950  Simulation Verification Note  Status: outpatient  NARRATIVE: The patient was brought to the treatment unit and placed in the planned treatment position. The clinical setup was verified. Then port films were obtained and uploaded to the radiation oncology medical record software.  The treatment beams were carefully compared against the planned radiation fields. The position location and shape of the radiation fields was reviewed. The targeted volume of tissue appears appropriately covered by the radiation beams. Organs at risk appear to be excluded as planned.  Based on my personal review, I approved the simulation verification. The patient's treatment will proceed as planned.  ------------------------------------------------  Thea Silversmith, MD

## 2013-12-23 ENCOUNTER — Ambulatory Visit
Admission: RE | Admit: 2013-12-23 | Discharge: 2013-12-23 | Disposition: A | Discharge status: Home / Self Care | Payer: BC Managed Care – PPO | Source: Ambulatory Visit | Attending: Radiation Oncology | Admitting: Radiation Oncology

## 2013-12-23 ENCOUNTER — Ambulatory Visit: Payer: BC Managed Care – PPO

## 2013-12-23 DIAGNOSIS — C50512 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: Secondary | ICD-10-CM

## 2013-12-23 MED ORDER — RADIAPLEXRX EX GEL
Freq: Once | CUTANEOUS | Status: AC
  Administered 2013-12-23: 15:00:00 via TOPICAL

## 2013-12-24 ENCOUNTER — Ambulatory Visit: Payer: BC Managed Care – PPO

## 2013-12-24 ENCOUNTER — Encounter: Payer: Self-pay | Admitting: Radiation Oncology

## 2013-12-24 ENCOUNTER — Ambulatory Visit
Admission: RE | Admit: 2013-12-24 | Discharge: 2013-12-24 | Disposition: A | Discharge status: Home / Self Care | Payer: BC Managed Care – PPO | Source: Ambulatory Visit | Attending: Radiation Oncology | Admitting: Radiation Oncology

## 2013-12-27 ENCOUNTER — Ambulatory Visit: Payer: BC Managed Care – PPO

## 2013-12-27 ENCOUNTER — Ambulatory Visit
Admission: RE | Admit: 2013-12-27 | Discharge: 2013-12-27 | Disposition: A | Discharge status: Home / Self Care | Payer: BC Managed Care – PPO | Source: Ambulatory Visit | Attending: Radiation Oncology | Admitting: Radiation Oncology

## 2013-12-28 ENCOUNTER — Ambulatory Visit: Payer: BC Managed Care – PPO

## 2013-12-28 ENCOUNTER — Ambulatory Visit (HOSPITAL_BASED_OUTPATIENT_CLINIC_OR_DEPARTMENT_OTHER): Payer: BC Managed Care – PPO | Admitting: Oncology

## 2013-12-28 ENCOUNTER — Encounter: Payer: Self-pay | Admitting: Oncology

## 2013-12-28 VITALS — BP 139/80 | HR 78 | Temp 97.5°F | Resp 20 | Ht 63.0 in | Wt 182.5 lb

## 2013-12-28 DIAGNOSIS — Z170 Estrogen receptor positive status [ER+]: Secondary | ICD-10-CM

## 2013-12-28 DIAGNOSIS — C50512 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: Secondary | ICD-10-CM

## 2013-12-28 DIAGNOSIS — D0590 Unspecified type of carcinoma in situ of unspecified breast: Secondary | ICD-10-CM

## 2013-12-28 MED ORDER — TAMOXIFEN CITRATE 20 MG PO TABS: 20.0000 mg | ORAL_TABLET | Freq: Every day | ORAL | Status: DC

## 2013-12-28 NOTE — Telephone Encounter (Signed)
appts made and printed...td 

## 2013-12-28 NOTE — Progress Notes (Signed)
OFFICE PROGRESS NOTE  CC Dr. Marland Kitchen [address] Dr. 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  STAGE:  Breast cancer of lower-outer quadrant of left female breast  Primary site: Breast (Left)  Staging method: AJCC 7th Edition  Clinical: Stage 0 (Tis (DCIS), N0, cM0)  Summary: Stage 0 (Tis (DCIS), N0, cM0)   PRIOR THERAPY:  #1 patient presented for a screening mammogram and was found to have left breast calcifications at the 6:00 position measuring 2.8 cm. Biopsy showed DCIS that was ER positive PR negative.   #2 patient is status post lumpectomy performed on 10/07/2013 the pathology revealed: Diagnosis 1. Breast, lumpectomy, Left - DUCTAL CARCINOMA IN SITU. - IN SITU CARCINOMA IS 1 MM FROM NEAREST INFERIOR-LATERAL MARGIN. - IN SITU CARCINOMA IS 1.5 MM FROM THE NEAREST SUPERIOR MARGIN. - SEE TUMOR SYNOPTIC TEMPLATE BELOW. 2. Skin , Multiple skin tags from bilateral axillas - MULTIPLE BENIGN FIBROEPITHELIAL POLYPS (SKIN TAGS).ER positive PR positive  #3 status post reexcision of positive margins on 10/25/2013. Final margins were all negative for DCIS.  #4. S/P radiation therapy to the left breast completed on 12/27/13. Overall tolerated well except for some skin erythema  #5. Begin adjuvant curative intent tamoifen 20 mg daily x 5 years  #6. Patient seen by Roma Kayser on 12/06/13 for genetic counseling and testing. Results pending  CURRENT THERAPY: tamoxifen 20 mg daily begin 12/28/13  INTERVAL HISTORY: KAYSEN SEFCIK 63 y.o. female returns for followup visit today. Clinically she seems to be doing well. She is recovered very nicely from her radiation. She  tender but otherwise no other complaints there is no signs of infections. She denies any headaches double vision blurring of vision. She has no aches or  pains no vaginal bleeding or discharge. We discussed starting tamoxifen. We discussed the risks and benefits and side effects. She is comfortable in starting. Today she denies any headaches double vision blurring of vision fevers chills night sweats. No shortness of breath chest pains palpitations. No abdominal pain no diarrhea or constipation. She has no easy bruising or bleeding. She has no myalgias and arthralgias. No peripheral paresthesias or gait disturbances. Remainder of the 10 point review of systems is negative.   MEDICAL HISTORY: Past Medical History  Diagnosis Date  . DIVERTICULOSIS, COLON 04/20/2007  . GERD 04/20/2007  . HEPATOMEGALY, HX OF 12/21/2009  . HYPERLIPIDEMIA 04/20/2007  . HYPERTENSION 04/20/2007  . LOW BACK PAIN 08/16/2008  . MENOPAUSAL SYNDROME 04/20/2007  . PEPTIC ULCER DISEASE 09/06/2010  . H/O hiatal hernia   . Arthritis     back  . Wears glasses   . Heart murmur     told by PCP- years ago-never had echo  . Breast cancer     Left Breast - ductal Carcinoma In-Situ - diagnosed on MRI- biopsy    ALLERGIES:  is allergic to penicillins; erythromycin; adhesive; amoxicillin; shellfish allergy; and sulfamethoxazole.  MEDICATIONS:  Current Outpatient Prescriptions  Medication Sig Dispense Refill  . atorvastatin (LIPITOR) 40 MG tablet Take 40 mg by mouth at bedtime.       . Calcium Carbonate-Vitamin D (CALCIUM + D PO) Take 1 tablet by mouth 2 (two) times daily before a meal.       . cetirizine (ZYRTEC) 10 MG tablet Take 10 mg by mouth daily as needed for allergies.      Marland Kitchen esomeprazole (  NEXIUM) 40 MG capsule Take 40 mg by mouth at bedtime.       . hyaluronate sodium (RADIAPLEXRX) GEL Apply 1 application topically 2 (two) times daily.      . hydrochlorothiazide (HYDRODIURIL) 25 MG tablet Take 25 mg by mouth at bedtime.       . Multiple Vitamins-Minerals (WOMENS MULTIVITAMIN PLUS PO) Take 1 tablet by mouth daily.       . pseudoephedrine (SUDAFED) 120 MG 12 hr tablet Take  120 mg by mouth 2 (two) times daily.      Marland Kitchen pyridOXINE (VITAMIN B-6) 100 MG tablet Take 100 mg by mouth daily.      . vitamin C (ASCORBIC ACID) 500 MG tablet Take 500 mg by mouth daily.      . vitamin E 400 UNIT capsule Take 400 Units by mouth daily.       No current facility-administered medications for this visit.    SURGICAL HISTORY:  Past Surgical History  Procedure Laterality Date  . Cholecystectomy    . Abdominal hysterectomy    . Tubal ligation    . Carpal tunnel release Bilateral   . Breast lumpectomy with needle localization Left 10/07/2013    Procedure: BREAST LUMPECTOMY WITH NEEDLE LOCALIZATION EXCISION OF BILATERAL AXILLARY SKIN TAGS ;  Surgeon: Edward Jolly, MD;  Location: [facility];  Service: General;  Laterality: Left;  . Re-excision of breast lumpectomy Left 10/25/2013    Procedure: RE-EXCISION OF BREAST LUMPECTOMY;  Surgeon: Edward Jolly, MD;  Location: [facility];  Service: General;  Laterality: Left;  clean wound class  . Breast surgery      REVIEW OF SYSTEMS:  Pertinent items are noted in HPI.     PHYSICAL EXAMINATION: Blood pressure 139/80, pulse 78, temperature 97.5 F (36.4 C), temperature source Oral, resp. rate 20, height 5\' 3"  (1.6 m), weight 182 lb 8 oz (82.781 kg). Body mass index is 32.34 kg/(m^2). ECOG PERFORMANCE STATUS: 0 - Asymptomatic  HEENT exam: EOMI PERRLA sclerae anicteric no conjunctival pallor oral mucosa is moist neck is supple no palpable adenopathy Lungs: Clear to auscultation and percussion Cardiovascular: No murmurs rubs regular rate rhythm Abdomen: Soft nontender nondistended bowel sounds are present no hepatosplenomegaly Extremities: No clubbing edema or cyanosis Neuro: Alert oriented x3 DTRs are +4 motor and sensory is intact strength is symmetrical in upper and lower extremities gait is normal Skin: No rashes Breasts: breasts appear normal, no suspicious masses, no skin or nipple changes or axillary nodes,  left breast with healing surgical scar   LABORATORY DATA: Lab Results  Component Value Date   WBC 7.6 10/01/2013   HGB 15.5* 10/25/2013   HCT 43.4 10/01/2013   MCV 84.9 10/01/2013   PLT 228 10/01/2013      Chemistry      Component Value Date/Time   NA 141 10/01/2013 1108   NA 143 09/01/2013 0803   K 3.3* 10/01/2013 1108   K 3.5 09/01/2013 0803   CL 102 10/01/2013 1108   CO2 28 10/01/2013 1108   CO2 28 09/01/2013 0803   BUN 13 10/01/2013 1108   BUN 13.1 09/01/2013 0803   CREATININE 0.84 10/01/2013 1108   CREATININE 0.9 09/01/2013 0803      Component Value Date/Time   CALCIUM 9.6 10/01/2013 1108   CALCIUM 10.0 09/01/2013 0803   ALKPHOS 106 09/01/2013 0803   ALKPHOS 74 09/07/2012 0908   AST 33 09/01/2013 0803   AST 98* 09/07/2012 0908   ALT 40 09/01/2013  0803   ALT 82* 09/07/2012 0908   BILITOT 0.78 09/01/2013 0803   BILITOT 0.8 09/07/2012 0908       RADIOGRAPHIC STUDIES:  No results found.  ASSESSMENT/PLAN: 63 year old female with  #1 stage 0 (Tis NX) DCIS of the left breast status post lumpectomy with the final pathology revealing DCIS ER positive PR positive. Postoperatively patient is doing well without any significant problems. Initially she did have positive margins. However on 10/25/2013 she underwent reexcision and final margins were negative.  #2 S/P adjuvant radiation therapy administered by Dr. Thea Silversmith completed 12/27/13  #3 Begin adjuvant curative intent antiestrogen therapy with tamoxifen 20 mg daily. We discussed risks benefits and side effects.. Duration of therapy is 5 years  #4 followup: 3 months with blood work and office visit.   All questions were answered. The patient knows to call the clinic with any problems, questions or concerns. We can certainly see the patient much sooner if necessary.  I spent 15 minutes counseling the patient face to face. The total time spent in the appointment was 30 minutes.  Marcy Panning,  MD Medical/Oncology [facility] [phone] (beeper) [phone] (Office)

## 2013-12-28 NOTE — Patient Instructions (Signed)

## 2013-12-29 ENCOUNTER — Other Ambulatory Visit: Payer: Self-pay

## 2013-12-29 MED ORDER — TAMOXIFEN CITRATE 20 MG PO TABS: 20.0000 mg | ORAL_TABLET | Freq: Every day | ORAL | Status: AC

## 2013-12-29 NOTE — Telephone Encounter (Signed)
Pt called - tamoxifen sent to wrong [facility] ([facility]).   [facility] and cancelled original order.  [facility]rip to [facility].  LM for patient that scrip has been resent to [facility] for her to pickup.

## 2014-01-03 ENCOUNTER — Telehealth: Payer: Self-pay | Admitting: Genetic Counselor

## 2014-01-03 ENCOUNTER — Encounter: Payer: Self-pay | Admitting: Genetic Counselor

## 2014-01-03 NOTE — Telephone Encounter (Signed)
Revealed negative genetic testing on breast/ovarian cancer panel.

## 2014-01-05 ENCOUNTER — Encounter: Payer: Self-pay | Admitting: Radiation Oncology

## 2014-01-05 NOTE — Progress Notes (Signed)
  Radiation Oncology         [phone] ________________________________  Name: Katie Becker MRN: [id number]  Date: 12/24/2013  DOB: [birth year]  End of Treatment Note  Diagnosis:   DCIS of the left breast     Indication for treatment:  Curative       Radiation treatment dates:   11/29/13-12/24/13  Site/dose:   Left breast (breath hold) / 42.72 Gy @ 2.67 Gy per fraction x 16 fractions Left breast boost / 10 Gy @ 2 Gy per fraction x 5 fractions  Beams/energy:  Opposed tangents with reduced fields / 6 MV photons Wedge pair/ 6 and 10 MV photons  Narrative: The patient tolerated radiation treatment relatively well.   She had minimal fatigue and skin darkening.   Plan: The patient has completed radiation treatment. The patient will return to radiation oncology clinic for routine followup in one month. I advised them to call or return sooner if they have any questions or concerns related to their recovery or treatment.  ------------------------------------------------  Thea Silversmith, MD

## 2014-01-05 NOTE — Progress Notes (Signed)
  Radiation Oncology         [phone]) [phone] ________________________________  Name: Katie Becker MRN: [id number]  Date: 12/21/13  DOB: Feb 23, 1951  Simulation Verification Note  Status: outpatient  NARRATIVE: The patient was brought to the treatment unit and placed in the planned treatment position. The clinical setup was verified. Then port films were obtained and uploaded to the radiation oncology medical record software.  The treatment beams were carefully compared against the planned radiation fields. The position location and shape of the radiation fields was reviewed. The targeted volume of tissue appears appropriately covered by the radiation beams. Organs at risk appear to be excluded as planned.  Based on my personal review, I approved the simulation verification. The patient's treatment will proceed as planned.  ------------------------------------------------  Thea Silversmith, MD

## 2014-01-05 NOTE — Progress Notes (Signed)
Name: RAIANNA SLIGHT   MRN: [id number]  Date:  01/05/2014   DOB: 08-Jul-1951  Status:outpatient    DIAGNOSIS: Breast cancer.  CONSENT VERIFIED: yes   SET UP: Patient is setup supine   IMMOBILIZATION:  The following immobilization was used:Custom Moldable Pillow, breast board.   NARRATIVE: Dante Gang underwent complex simulation and treatment planning for her boost treatment today.  Her tumor volume was outlined on the planning CT scan.  Due to the depth of her cavity, electrons could not be used and a photon plan was developed. The plan will be prescribed to the 100% isodose line.   I personally supervised and approved the creation of 3 unique MLCs comprising 3  treatment devices.

## 2014-01-11 ENCOUNTER — Encounter: Payer: Self-pay | Admitting: Internal Medicine

## 2014-01-11 ENCOUNTER — Ambulatory Visit (INDEPENDENT_AMBULATORY_CARE_PROVIDER_SITE_OTHER): Payer: BC Managed Care – PPO | Admitting: Internal Medicine

## 2014-01-11 VITALS — BP 140/80 | HR 72 | Temp 97.9°F | Resp 20 | Ht 63.0 in | Wt 186.0 lb

## 2014-01-11 DIAGNOSIS — C50519 Malignant neoplasm of lower-outer quadrant of unspecified female breast: Secondary | ICD-10-CM

## 2014-01-11 DIAGNOSIS — E785 Hyperlipidemia, unspecified: Secondary | ICD-10-CM

## 2014-01-11 DIAGNOSIS — I10 Essential (primary) hypertension: Secondary | ICD-10-CM

## 2014-01-11 DIAGNOSIS — C50512 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: Secondary | ICD-10-CM

## 2014-01-11 MED ORDER — NYSTATIN-TRIAMCINOLONE 100000-0.1 UNIT/GM-% EX OINT: 1.0000 "application " | TOPICAL_OINTMENT | Freq: Two times a day (BID) | CUTANEOUS | Status: DC

## 2014-01-11 NOTE — Patient Instructions (Signed)
Limit your sodium (Salt) intake    It is important that you exercise regularly, at least 20 minutes 3 to 4 times per week.  If you develop chest pain or shortness of breath seek  medical attention.  Please check your blood pressure on a regular basis.  If it is consistently greater than 150/90, please make an office appointment.  You need to lose weight.  Consider a lower calorie diet and regular exercise.  Return in 6 months for follow-up

## 2014-01-11 NOTE — Progress Notes (Signed)
Pre-visit discussion using our clinic review tool. No additional management support is needed unless otherwise documented below in the visit note.  

## 2014-01-11 NOTE — Progress Notes (Signed)
Subjective:    Patient ID: Katie Becker, female    DOB: 05/29/1951, 63 y.o.   MRN: [id number]  HPI  63 year old patient who has hypertension and dyslipidemia.  She has a recent history of left breast cancer and is status post radiation treatment.  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 She presents with a chief complaint of cough of 6 weeks duration.  This this began as a mild sore throat, and she has had persistent cough.  Cough initially was mildly productive, but now is dry.  No shortness of breath, or other complaints.  Past Medical History  Diagnosis Date  . DIVERTICULOSIS, COLON 04/20/2007  . GERD 04/20/2007  . HEPATOMEGALY, HX OF 12/21/2009  . HYPERLIPIDEMIA 04/20/2007  . HYPERTENSION 04/20/2007  . LOW BACK PAIN 08/16/2008  . MENOPAUSAL SYNDROME 04/20/2007  . PEPTIC ULCER DISEASE 09/06/2010  . H/O hiatal hernia   . Arthritis     back  . Wears glasses   . Heart murmur     told by PCP- years ago-never had echo  . 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 Marital Status: Widowed    Spouse Name: N/A    Number of Children: 2  . Years of Education: N/A   Occupational History  .  [employer]-Cust Serv  And  [employer]   Social History Main Topics  . Smoking status: Former Smoker    Quit date: 10/21/1978  . Smokeless tobacco: Never Used  . Alcohol Use: No  . Drug Use: No  . Sexual Activity: Not Currently   Other Topics Concern  . Not on file   Social History Narrative  . No narrative on file    Past Surgical History  Procedure Laterality Date  . Cholecystectomy    . Abdominal hysterectomy    . Tubal ligation    . Carpal tunnel release Bilateral   . Breast lumpectomy with needle localization Left 10/07/2013    Procedure: BREAST LUMPECTOMY WITH NEEDLE LOCALIZATION EXCISION OF BILATERAL AXILLARY SKIN TAGS ;  Surgeon: Edward Jolly, MD;   Location: [facility];  Service: General;  Laterality: Left;  . Re-excision of breast lumpectomy Left 10/25/2013    Procedure: RE-EXCISION OF BREAST LUMPECTOMY;  Surgeon: Edward Jolly, MD;  Location: [facility];  Service: General;  Laterality: Left;  clean wound class  . Breast surgery      Family History  Problem Relation Age of Onset  . Hypertension Mother   . Hyperlipidemia Mother   . Heart disease Mother     congestive heart failure  . Cancer Father     hx of melanoma  . Hypertension Sister   . Skin cancer Sister 50    basal cell carcinoma  . Hypertension Brother   . Heart disease Maternal Grandmother 42  . Prostate cancer Cousin   . Breast cancer Maternal Aunt 60  . Colon cancer Paternal Grandmother 84  . Heart disease Maternal Aunt 42    Allergies  Allergen Reactions  . Penicillins Anaphylaxis  . Erythromycin Nausea And Vomiting  . Adhesive [Tape]     rash  . Amoxicillin     REACTION: unspecified  . Shellfish Allergy     welts  . Sulfamethoxazole Hives    REACTION: unspecified    Current Outpatient Prescriptions on File Prior to Visit  Medication  Sig Dispense Refill  . atorvastatin (LIPITOR) 40 MG tablet Take 40 mg by mouth at bedtime.       . Calcium Carbonate-Vitamin D (CALCIUM + D PO) Take 1 tablet by mouth 2 (two) times daily before a meal.       . cetirizine (ZYRTEC) 10 MG tablet Take 10 mg by mouth daily as needed for allergies.      Marland Kitchen esomeprazole (NEXIUM) 40 MG capsule Take 40 mg by mouth at bedtime.       . hyaluronate sodium (RADIAPLEXRX) GEL Apply 1 application topically 2 (two) times daily.      . hydrochlorothiazide (HYDRODIURIL) 25 MG tablet Take 25 mg by mouth at bedtime.       . Multiple Vitamins-Minerals (WOMENS MULTIVITAMIN PLUS PO) Take 1 tablet by mouth daily.       . pseudoephedrine (SUDAFED) 120 MG 12 hr tablet Take 120 mg by mouth 2 (two) times daily.      Marland Kitchen pyridOXINE (VITAMIN B-6) 100 MG tablet Take 100 mg by mouth daily.       . tamoxifen (NOLVADEX) 20 MG tablet Take 1 tablet (20 mg total) by mouth daily.  90 tablet  12  . vitamin C (ASCORBIC ACID) 500 MG tablet Take 500 mg by mouth daily.      . vitamin E 400 UNIT capsule Take 400 Units by mouth daily.       No current facility-administered medications on file prior to visit.    BP 140/80  Pulse 72  Temp(Src) 97.9 F (36.6 C) (Oral)  Resp 20  Ht 5\' 3"  (1.6 m)  Wt 186 lb (84.369 kg)  BMI 32.96 kg/m2  SpO2 98%       Review of Systems  Constitutional: Negative.   HENT: Negative for congestion, dental problem, hearing loss, rhinorrhea, sinus pressure, sore throat and tinnitus.   Eyes: Negative for pain, discharge and visual disturbance.  Respiratory: Positive for cough. Negative for shortness of breath.   Cardiovascular: Negative for chest pain, palpitations and leg swelling.  Gastrointestinal: Negative for nausea, vomiting, abdominal pain, diarrhea, constipation, blood in stool and abdominal distention.  Genitourinary: Negative for dysuria, urgency, frequency, hematuria, flank pain, vaginal bleeding, vaginal discharge, difficulty urinating, vaginal pain and pelvic pain.  Musculoskeletal: Negative for arthralgias, gait problem and joint swelling.  Skin: Positive for rash.  Neurological: Negative for dizziness, syncope, speech difficulty, weakness, numbness and headaches.  Hematological: Negative for adenopathy.  Psychiatric/Behavioral: Negative for behavioral problems, dysphoric mood and agitation. The patient is not nervous/anxious.        Objective:   Physical Exam  Constitutional: She is oriented to person, place, and time. She appears well-developed and well-nourished.  HENT:  Head: Normocephalic.  Right Ear: External ear normal.  Left Ear: External ear normal.  Mouth/Throat: Oropharynx is clear and moist.  Eyes: Conjunctivae and EOM are normal. Pupils are equal, round, and reactive to light.  Neck: Normal range of motion. Neck supple.  No thyromegaly present.  Cardiovascular: Normal rate, regular rhythm, normal heart sounds and intact distal pulses.   Pulmonary/Chest: Effort normal and breath sounds normal. No respiratory distress. She has no wheezes. She has no rales.  Normal O2 saturation  Abdominal: Soft. Bowel sounds are normal. She exhibits no mass. There is no tenderness.  Musculoskeletal: Normal range of motion.  Lymphadenopathy:    She has no cervical adenopathy.  Neurological: She is alert and oriented to person, place, and time.  Skin: Skin is warm and dry. No  rash noted.  Postradiation hyperpigmentation and scaling involving the left breast and left anterior chest wall areas  Psychiatric: She has a normal mood and affect. Her behavior is normal.          Assessment & Plan:   Chronic cough for 6 weeks duration.  Unclear whether this is a postviral tracheobronchitis versus radiation pneumonitis.  Cough seems to be slowly improving and not developed pain.  Will observe at this time Hypertension stable

## 2014-01-12 ENCOUNTER — Telehealth: Payer: Self-pay | Admitting: Internal Medicine

## 2014-01-12 NOTE — Telephone Encounter (Signed)
Relevant patient education assigned to patient using Emmi. ° °

## 2014-01-13 ENCOUNTER — Ambulatory Visit: Payer: BC Managed Care – PPO | Admitting: Internal Medicine

## 2014-02-03 ENCOUNTER — Ambulatory Visit
Admission: RE | Admit: 2014-02-03 | Discharge: 2014-02-03 | Disposition: A | Discharge status: Home / Self Care | Payer: BC Managed Care – PPO | Source: Ambulatory Visit | Attending: Radiation Oncology | Admitting: Radiation Oncology

## 2014-02-03 ENCOUNTER — Encounter: Payer: Self-pay | Admitting: Radiation Oncology

## 2014-02-03 VITALS — BP 129/77 | HR 70 | Temp 97.5°F | Resp 20 | Wt 184.4 lb

## 2014-02-03 DIAGNOSIS — C50512 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: Secondary | ICD-10-CM

## 2014-02-03 NOTE — Progress Notes (Signed)
Pt denies pain but does report sharp shooting pains in her left breast intermittently. She continues to apply Radiaplex lotion and Aloe to left breast, some skin darkening remains. She is taking Tamoxifen 20 mg daily. Denies fatigue, loss of appetite.

## 2014-02-03 NOTE — Progress Notes (Signed)
   Department of Radiation Oncology  Phone:  [phone] Fax:        [phone]   Name: Katie Becker MRN: [id number]  DOB: 20-Sep-1951  Date: 02/03/2014  Follow Up Visit Note  Diagnosis: DCIS of the left breast  Summary and Interval since last radiation: 52.72 Gy completed 12/24/13  Interval History: Katie Becker presents today for routine followup.  She is doing well. She has occasional sharp shooting pains in her left breast. She continues to use lotion and aloe. She is taking tamoxifen. She is looking forward to retiring at the end of June.  Allergies:  Allergies  Allergen Reactions  . Penicillins Anaphylaxis  . Erythromycin Nausea And Vomiting  . Adhesive [Tape]     rash  . Amoxicillin     REACTION: unspecified  . Shellfish Allergy     welts  . Sulfamethoxazole Hives    REACTION: unspecified    Medications:  Current Outpatient Prescriptions  Medication Sig Dispense Refill  . atorvastatin (LIPITOR) 40 MG tablet Take 40 mg by mouth at bedtime.       . Calcium Carbonate-Vitamin D (CALCIUM + D PO) Take 1 tablet by mouth 2 (two) times daily before a meal.       . cetirizine (ZYRTEC) 10 MG tablet Take 10 mg by mouth daily as needed for allergies.      Marland Kitchen esomeprazole (NEXIUM) 40 MG capsule Take 40 mg by mouth at bedtime.       . hyaluronate sodium (RADIAPLEXRX) GEL Apply 1 application topically 2 (two) times daily.      . hydrochlorothiazide (HYDRODIURIL) 25 MG tablet Take 25 mg by mouth at bedtime.       . Multiple Vitamins-Minerals (WOMENS MULTIVITAMIN PLUS PO) Take 1 tablet by mouth daily.       Marland Kitchen nystatin-triamcinolone ointment (MYCOLOG) Apply 1 application topically 2 (two) times daily.  30 g  0  . pseudoephedrine (SUDAFED) 120 MG 12 hr tablet Take 120 mg by mouth 2 (two) times daily.      Marland Kitchen pyridOXINE (VITAMIN B-6) 100 MG tablet Take 100 mg by mouth daily.      . tamoxifen (NOLVADEX) 20 MG tablet Take 20 mg by mouth daily.      . vitamin C (ASCORBIC ACID) 500 MG tablet Take  500 mg by mouth daily.      . vitamin E 400 UNIT capsule Take 400 Units by mouth daily.       No current facility-administered medications for this encounter.    Physical Exam:  Filed Vitals:   02/03/14 1300  BP: 129/77  Pulse: 70  Temp: 97.5 F (36.4 C)  TempSrc: Oral  Resp: 20  Weight: 184 lb 6.4 oz (83.643 kg)   some hyperpigmentation especially over the left inframammary fold. The rest of the skin is healed up well.  IMPRESSION: Katie Becker is a 63 y.o. female status post radiation for DCIS of the left breast with resolving acute effects of treatment   PLAN:  We discussed the use of vitamin E lotion in the treated area. We discussed sun protection in the treated area. She will continue on her tamoxifen. She has followup scheduled with medical oncology. I be happy to see her back on an as-needed basis. She knows she can contact me with any questions in the interim. I've also referred her for our live strong program.     Thea Silversmith, MD

## 2014-03-02 ENCOUNTER — Ambulatory Visit: Payer: BC Managed Care – PPO | Admitting: Oncology

## 2014-03-15 ENCOUNTER — Telehealth: Payer: Self-pay | Admitting: *Deleted

## 2014-03-15 ENCOUNTER — Encounter: Payer: Self-pay | Admitting: Oncology

## 2014-03-15 NOTE — Telephone Encounter (Signed)
Verbal order received and read back from Oneida Alar NP for physical therapy referral for patient's left arm.  If Left arm is swollen, obtain doppler study.  Message sent via MyChart to this patient.  After no receipt of a return call, messsage left on mobile number requesting a return call.  Awaiting return call from patient.

## 2014-03-16 ENCOUNTER — Telehealth: Payer: Self-pay | Admitting: Oncology

## 2014-03-16 ENCOUNTER — Other Ambulatory Visit: Payer: Self-pay | Admitting: *Deleted

## 2014-03-16 DIAGNOSIS — C50512 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: Secondary | ICD-10-CM

## 2014-03-16 NOTE — Telephone Encounter (Signed)
Patient returned my call.  Reports the "left arm won't go back as far.  My left arm with movement pulls the muscle across my chest, it's sore and not as flexible.  I need help to get this area back to what is was before surgery."  Denies swelling to left arm.  Informed her of orders for PT and this referral will be made.  Request a return call Monday through Friday before 2:00 pm from rehab to mobile number [phone].    Referral enterd.  Scheduled communication sent.

## 2014-03-16 NOTE — Telephone Encounter (Signed)
, °

## 2014-03-22 ENCOUNTER — Encounter: Payer: Self-pay | Admitting: Internal Medicine

## 2014-03-22 ENCOUNTER — Ambulatory Visit (INDEPENDENT_AMBULATORY_CARE_PROVIDER_SITE_OTHER): Payer: BC Managed Care – PPO | Admitting: Internal Medicine

## 2014-03-22 VITALS — BP 130/80 | HR 73 | Temp 98.0°F | Resp 20 | Ht 63.0 in | Wt 186.0 lb

## 2014-03-22 DIAGNOSIS — E785 Hyperlipidemia, unspecified: Secondary | ICD-10-CM

## 2014-03-22 DIAGNOSIS — C50512 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: Secondary | ICD-10-CM

## 2014-03-22 DIAGNOSIS — C50519 Malignant neoplasm of lower-outer quadrant of unspecified female breast: Secondary | ICD-10-CM

## 2014-03-22 DIAGNOSIS — I10 Essential (primary) hypertension: Secondary | ICD-10-CM

## 2014-03-22 NOTE — Progress Notes (Signed)
Subjective:    Patient ID: Katie Becker, female    DOB: [birth year], 63 y.o.   MRN: [id number]  HPI  63 year old patient who has hypertension and dyslipidemia.  She is followed closely for left breast cancer.  She has had some decreased range of motion of her left arm and shoulder and is scheduled to start physical therapy.  Doing quite well. She anticipates starting retirement in about one month  Past Medical History  Diagnosis Date  . DIVERTICULOSIS, COLON 04/20/2007  . GERD 04/20/2007  . HEPATOMEGALY, HX OF 12/21/2009  . HYPERLIPIDEMIA 04/20/2007  . HYPERTENSION 04/20/2007  . LOW BACK PAIN 08/16/2008  . MENOPAUSAL SYNDROME 04/20/2007  . PEPTIC ULCER DISEASE 09/06/2010  . H/O hiatal hernia   . Arthritis     back  . Wears glasses   . Heart murmur     told by PCP- years ago-never had echo  . 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 Marital Status: Widowed    Spouse Name: N/A    Number of Children: 2  . Years of Education: N/A   Occupational History  .  [employer]-Cust Serv  And  [employer] Emp   Social History Main Topics  . Smoking status: Former Smoker    Quit date: 10/21/1978  . Smokeless tobacco: Never Used  . Alcohol Use: No  . Drug Use: No  . Sexual Activity: Not Currently   Other Topics Concern  . Not on file   Social History Narrative  . No narrative on file    Past Surgical History  Procedure Laterality Date  . Cholecystectomy    . Abdominal hysterectomy    . Tubal ligation    . Carpal tunnel release Bilateral   . Breast lumpectomy with needle localization Left 10/07/2013    Procedure: BREAST LUMPECTOMY WITH NEEDLE LOCALIZATION EXCISION OF BILATERAL AXILLARY SKIN TAGS ;  Surgeon: Edward Jolly, MD;  Location: [place];  Service: General;  Laterality: Left;  . Re-excision of breast lumpectomy Left 10/25/2013    Procedure: RE-EXCISION OF BREAST LUMPECTOMY;  Surgeon: Edward Jolly, MD;  Location: [place];  Service: General;  Laterality: Left;  clean wound class  . Breast surgery      Family History  Problem Relation Age of Onset  . Hypertension Mother   . Hyperlipidemia Mother   . Heart disease Mother     congestive heart failure  . Cancer Father     hx of melanoma  . Hypertension Sister   . Skin cancer Sister 42    basal cell carcinoma  . Hypertension Brother   . Heart disease Maternal Grandmother 78  . Prostate cancer Cousin   . Breast cancer Maternal Aunt 60  . Colon cancer Paternal Grandmother 27  . Heart disease Maternal Aunt 42    Allergies  Allergen Reactions  . Penicillins Anaphylaxis  . Erythromycin Nausea And Vomiting  . Adhesive [Tape]     rash  . Amoxicillin     REACTION: unspecified  . Shellfish Allergy     welts  . Sulfamethoxazole Hives    REACTION: unspecified    Current Outpatient Prescriptions on File Prior to Visit  Medication Sig Dispense Refill  . atorvastatin (LIPITOR) 40 MG tablet Take 40 mg by mouth at bedtime.       . Calcium Carbonate-Vitamin D (CALCIUM + D PO)  Take 1 tablet by mouth 2 (two) times daily before a meal.       . cetirizine (ZYRTEC) 10 MG tablet Take 10 mg by mouth daily as needed for allergies.      Marland Kitchen esomeprazole (NEXIUM) 40 MG capsule Take 40 mg by mouth at bedtime.       . hyaluronate sodium (RADIAPLEXRX) GEL Apply 1 application topically 2 (two) times daily.      . hydrochlorothiazide (HYDRODIURIL) 25 MG tablet Take 25 mg by mouth at bedtime.       . Multiple Vitamins-Minerals (WOMENS MULTIVITAMIN PLUS PO) Take 1 tablet by mouth daily.       . pseudoephedrine (SUDAFED) 120 MG 12 hr tablet Take 120 mg by mouth 2 (two) times daily as needed.       . pyridOXINE (VITAMIN B-6) 100 MG tablet Take 100 mg by mouth daily.      . tamoxifen (NOLVADEX) 20 MG tablet Take 20 mg by mouth daily.      . vitamin C (ASCORBIC ACID) 500 MG tablet Take 500 mg by mouth daily.      . vitamin E 400  UNIT capsule Take 400 Units by mouth daily.       No current facility-administered medications on file prior to visit.    BP 130/80  Pulse 73  Temp(Src) 98 F (36.7 C) (Oral)  Resp 20  Ht 5\' 3"  (1.6 m)  Wt 186 lb (84.369 kg)  BMI 32.96 kg/m2  SpO2 98%       Review of Systems  Constitutional: Negative.   HENT: Negative for congestion, dental problem, hearing loss, rhinorrhea, sinus pressure, sore throat and tinnitus.   Eyes: Negative for pain, discharge and visual disturbance.  Respiratory: Negative for cough and shortness of breath.   Cardiovascular: Negative for chest pain, palpitations and leg swelling.  Gastrointestinal: Negative for nausea, vomiting, abdominal pain, diarrhea, constipation, blood in stool and abdominal distention.  Genitourinary: Negative for dysuria, urgency, frequency, hematuria, flank pain, vaginal bleeding, vaginal discharge, difficulty urinating, vaginal pain and pelvic pain.  Musculoskeletal: Negative for arthralgias, gait problem and joint swelling.       Decreased range of motion left arm  Skin: Negative for rash.  Neurological: Negative for dizziness, syncope, speech difficulty, weakness, numbness and headaches.  Hematological: Negative for adenopathy.  Psychiatric/Behavioral: Negative for behavioral problems, dysphoric mood and agitation. The patient is not nervous/anxious.        Objective:   Physical Exam  Constitutional: She is oriented to person, place, and time. She appears well-developed and well-nourished.  Blood pressure 120/70  HENT:  Head: Normocephalic.  Right Ear: External ear normal.  Left Ear: External ear normal.  Mouth/Throat: Oropharynx is clear and moist.  Eyes: Conjunctivae and EOM are normal. Pupils are equal, round, and reactive to light.  Neck: Normal range of motion. Neck supple. No thyromegaly present.  Cardiovascular: Normal rate, regular rhythm, normal heart sounds and intact distal pulses.   Pulmonary/Chest:  Effort normal and breath sounds normal.  Abdominal: Soft. Bowel sounds are normal. She exhibits no mass. There is no tenderness.  Musculoskeletal: Normal range of motion.  Lymphadenopathy:    She has no cervical adenopathy.  Neurological: She is alert and oriented to person, place, and time.  Skin: Skin is warm and dry. No rash noted.  Psychiatric: She has a normal mood and affect. Her behavior is normal.          Assessment & Plan:   Hypertension.  Excellent  control.  We'll continue present regimen Dyslipidemia.  Continue statin therapy  Recheck 6 months

## 2014-03-22 NOTE — Patient Instructions (Signed)
Limit your sodium (Salt) intake  Please check your blood pressure on a regular basis.  If it is consistently greater than 150/90, please make an office appointment.    It is important that you exercise regularly, at least 20 minutes 3 to 4 times per week.  If you develop chest pain or shortness of breath seek  medical attention.  Return in 6 months for follow-up  You need to lose weight.  Consider a lower calorie diet and regular exercise.

## 2014-03-22 NOTE — Progress Notes (Signed)
Pre-visit discussion using our clinic review tool. No additional management support is needed unless otherwise documented below in the visit note.  

## 2014-03-23 ENCOUNTER — Ambulatory Visit: Payer: BC Managed Care – PPO | Attending: Oncology | Admitting: Physical Therapy

## 2014-03-23 DIAGNOSIS — C50919 Malignant neoplasm of unspecified site of unspecified female breast: Secondary | ICD-10-CM | POA: Insufficient documentation

## 2014-03-23 DIAGNOSIS — M24519 Contracture, unspecified shoulder: Secondary | ICD-10-CM | POA: Insufficient documentation

## 2014-03-23 DIAGNOSIS — IMO0001 Reserved for inherently not codable concepts without codable children: Secondary | ICD-10-CM | POA: Insufficient documentation

## 2014-03-23 DIAGNOSIS — M25519 Pain in unspecified shoulder: Secondary | ICD-10-CM | POA: Insufficient documentation

## 2014-04-11 ENCOUNTER — Encounter: Payer: Self-pay | Admitting: Oncology

## 2014-05-12 ENCOUNTER — Encounter (INDEPENDENT_AMBULATORY_CARE_PROVIDER_SITE_OTHER): Payer: BC Managed Care – PPO | Admitting: General Surgery

## 2014-06-08 ENCOUNTER — Ambulatory Visit (INDEPENDENT_AMBULATORY_CARE_PROVIDER_SITE_OTHER): Payer: BC Managed Care – PPO | Admitting: General Surgery

## 2014-06-08 VITALS — BP 130/76 | HR 74 | Temp 97.9°F | Ht 62.0 in | Wt 179.4 lb

## 2014-06-08 DIAGNOSIS — C50519 Malignant neoplasm of lower-outer quadrant of unspecified female breast: Secondary | ICD-10-CM

## 2014-06-08 DIAGNOSIS — C50512 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: Secondary | ICD-10-CM

## 2014-06-08 NOTE — Progress Notes (Signed)
Chief complaint: Followup breast cancer  History: Patient returns for followup history of left breast lumpectomy in December of 2014 for a 2.3 cm area of ductal carcinoma in situ of the left breast. She received postoperative radiation therapy. She is on tamoxifen and had some hot flashes but generally tolerating this okay. She has had some ongoing stiffness and mild pain in her left chest wall and under her left arm since treatment. She continues to do exercises and has seen physical therapy. Overall she feels she's doing pretty well. No lumps or skin changes or other concerns about her breast.  Exam: BP 130/76  Pulse 74  Temp(Src) 97.9 F (36.6 C) (Oral)  Ht 5\' 2"  (1.575 m)  Wt 179 lb 6 oz (81.364 kg)  BMI 32.80 kg/m2 General: Appears well Lymph nodes: No cervical, supraclavicular or axillary nodes palpable Lungs: Clear breath sounds bilaterally Breasts: Status post left breast lumpectomy with slight depression the inferior left breast and mild postradiation changes. No palpable masses or worsen skin changes. Right breast is negative to exam  Imaging: Mammogram due in December  Assessment and plan: Doing well following lumpectomy, radiation and tamoxifen for stage 0 cancer of the left breast. Return in one year for followup.

## 2014-06-23 ENCOUNTER — Telehealth: Payer: Self-pay | Admitting: Oncology

## 2014-06-23 NOTE — Telephone Encounter (Signed)
,  v

## 2014-06-30 ENCOUNTER — Ambulatory Visit: Payer: BC Managed Care – PPO | Admitting: Oncology

## 2014-06-30 ENCOUNTER — Other Ambulatory Visit: Payer: BC Managed Care – PPO

## 2014-07-13 ENCOUNTER — Other Ambulatory Visit: Payer: Self-pay | Admitting: *Deleted

## 2014-07-13 DIAGNOSIS — C50512 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: Secondary | ICD-10-CM

## 2014-07-14 ENCOUNTER — Encounter: Payer: Self-pay | Admitting: Hematology and Oncology

## 2014-07-14 ENCOUNTER — Ambulatory Visit (HOSPITAL_BASED_OUTPATIENT_CLINIC_OR_DEPARTMENT_OTHER): Payer: BC Managed Care – PPO | Admitting: Hematology and Oncology

## 2014-07-14 ENCOUNTER — Telehealth: Payer: Self-pay | Admitting: Hematology and Oncology

## 2014-07-14 ENCOUNTER — Other Ambulatory Visit (HOSPITAL_BASED_OUTPATIENT_CLINIC_OR_DEPARTMENT_OTHER): Payer: BC Managed Care – PPO

## 2014-07-14 VITALS — BP 141/69 | HR 72 | Temp 98.1°F | Resp 18 | Ht 62.0 in | Wt 180.9 lb

## 2014-07-14 DIAGNOSIS — C50512 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: Secondary | ICD-10-CM

## 2014-07-14 DIAGNOSIS — Z79810 Long term (current) use of selective estrogen receptor modulators (SERMs): Secondary | ICD-10-CM

## 2014-07-14 DIAGNOSIS — Z853 Personal history of malignant neoplasm of breast: Secondary | ICD-10-CM

## 2014-07-14 LAB — CBC WITH DIFFERENTIAL/PLATELET
BASO%: 0.7 % (ref 0.0–2.0)
Basophils Absolute: 0 10*3/uL (ref 0.0–0.1)
EOS%: 3.6 % (ref 0.0–7.0)
Eosinophils Absolute: 0.2 10*3/uL (ref 0.0–0.5)
HCT: 42.6 % (ref 34.8–46.6)
HGB: 14.3 g/dL (ref 11.6–15.9)
LYMPH%: 27.8 % (ref 14.0–49.7)
MCH: 29.5 pg (ref 25.1–34.0)
MCHC: 33.6 g/dL (ref 31.5–36.0)
MCV: 87.9 fL (ref 79.5–101.0)
MONO#: 0.4 10*3/uL (ref 0.1–0.9)
MONO%: 6.5 % (ref 0.0–14.0)
NEUT#: 3.6 10*3/uL (ref 1.5–6.5)
NEUT%: 61.4 % (ref 38.4–76.8)
PLATELETS: 189 10*3/uL (ref 145–400)
RBC: 4.84 10*6/uL (ref 3.70–5.45)
RDW: 12.6 % (ref 11.2–14.5)
WBC: 5.8 10*3/uL (ref 3.9–10.3)
lymph#: 1.6 10*3/uL (ref 0.9–3.3)

## 2014-07-14 LAB — COMPREHENSIVE METABOLIC PANEL (CC13)
ALK PHOS: 87 U/L (ref 40–150)
ALT: 65 U/L — AB (ref 0–55)
AST: 77 U/L — ABNORMAL HIGH (ref 5–34)
Albumin: 3.6 g/dL (ref 3.5–5.0)
Anion Gap: 11 mEq/L (ref 3–11)
BUN: 14.1 mg/dL (ref 7.0–26.0)
CO2: 28 mEq/L (ref 22–29)
Calcium: 9.6 mg/dL (ref 8.4–10.4)
Chloride: 104 mEq/L (ref 98–109)
Creatinine: 1.1 mg/dL (ref 0.6–1.1)
Glucose: 131 mg/dl (ref 70–140)
POTASSIUM: 3.4 meq/L — AB (ref 3.5–5.1)
SODIUM: 143 meq/L (ref 136–145)
TOTAL PROTEIN: 7 g/dL (ref 6.4–8.3)
Total Bilirubin: 0.66 mg/dL (ref 0.20–1.20)

## 2014-07-14 NOTE — Telephone Encounter (Signed)
gv and printed appt sched and avs forpt for NOV and March

## 2014-07-14 NOTE — Progress Notes (Signed)
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  History: #1 patient presented for a screening mammogram and was found to have left breast calcifications at the 6:00 position measuring 2.8 cm. Biopsy showed DCIS that was ER positive PR negative.   #2 patient is status post lumpectomy performed on 10/07/2013 the pathology revealed: DCIS Skin , Multiple skin tags from bilateral axillas  - MULTIPLE BENIGN FIBROEPITHELIAL POLYPS (SKIN TAGS).ER positive PR positive  #3 status post reexcision of positive margins on 10/25/2013. Final margins were all negative for DCIS.  #4. S/P radiation therapy to the left breast completed on 12/27/13. Overall tolerated well except for some skin erythema  #5. 01/2014: adjuvant curative intent tamoifen 20 mg daily x 5 years  #6. Patient seen by Roma Kayser on 12/06/13 for genetic counseling and testing.  CHIEF COMPLIANT: Followup of breast cancer  INTERVAL HISTORY: Ms.Okelley  is a 63 year old Caucasian with above-mentioned history of DCIS involving the left breast. She was treated with lumpectomy, radiation and is been on tamoxifen since April 2015. She reports that the pot from dryness of the skin, she is tolerating tamoxifen fairly well. She denies any new lumps or nodules in the breast. She will need to be scheduled for a mammogram for October.   REVIEW OF SYSTEMS:   Constitutional: Denies fevers, chills or abnormal weight loss Eyes: Denies blurriness of vision Ears, nose, mouth, throat, and face: Denies mucositis or sore throat Respiratory: Denies cough, dyspnea or wheezes Cardiovascular:  Denies palpitation, chest discomfort or lower extremity swelling Gastrointestinal:  Denies nausea, heartburn or change in bowel habits Skin: Denies abnormal skin rashes Lymphatics: Denies new lymphadenopathy or easy bruising Neurological:Denies numbness, tingling or new weaknesses Behavioral/Psych: Mood is stable, no new changes  Breast:  denies any pain or lumps or nodules in either breasts All other systems were reviewed with the patient and are negative.  I have reviewed the past medical history, past surgical history, social history and family history with the patient and they are unchanged from previous note.  ALLERGIES:  is allergic to penicillins; erythromycin; adhesive; amoxicillin; shellfish allergy; and sulfamethoxazole.  MEDICATIONS:  Current Outpatient Prescriptions  Medication Sig Dispense Refill  . atorvastatin (LIPITOR) 40 MG tablet Take 40 mg by mouth at bedtime.       . Calcium Carbonate-Vitamin D (CALCIUM + D PO) Take 1 tablet by mouth 2 (two) times daily before a meal.       . cetirizine (ZYRTEC) 10 MG tablet Take 10 mg by mouth daily as needed for allergies.      Marland Kitchen esomeprazole (NEXIUM) 40 MG capsule Take 40 mg by mouth at bedtime.       . hydrochlorothiazide (HYDRODIURIL) 25 MG tablet Take 25 mg by mouth at bedtime.       . Multiple Vitamins-Minerals (WOMENS MULTIVITAMIN PLUS PO) Take 1 tablet by mouth daily.       Marland Kitchen nystatin-triamcinolone ointment (MYCOLOG) Apply 1 application topically 2 (two) times daily as needed.      . pseudoephedrine (SUDAFED) 120 MG 12 hr tablet Take 120 mg by mouth 2 (two) times daily  as needed.       . pyridOXINE (VITAMIN B-6) 100 MG tablet Take 100 mg by mouth daily.      . tamoxifen (NOLVADEX) 20 MG tablet Take 20 mg by mouth daily.      . vitamin C (ASCORBIC ACID) 500 MG tablet Take 500 mg by mouth daily.      . vitamin E 400 UNIT capsule Take 400 Units by mouth daily.       No current facility-administered medications for this visit.     PHYSICAL EXAMINATION: ECOG PERFORMANCE STATUS: 0 - Asymptomatic  Filed Vitals:   07/14/14 1116  BP: 141/69  Pulse: 72  Temp: 98.1 F (36.7 C)  Resp: 18   Filed Weights   07/14/14 1116  Weight: 180 lb 14.4 oz (82.056 kg)    GENERAL:alert, no distress and comfortable SKIN: skin color, texture, turgor are normal, no rashes or significant lesions EYES: normal, Conjunctiva are pink and non-injected, sclera clear OROPHARYNX:no exudate, no erythema and lips, buccal mucosa, and tongue normal  NECK: supple, thyroid normal size, non-tender, without nodularity LYMPH:  no palpable lymphadenopathy in the cervical, axillary or inguinal LUNGS: clear to auscultation and percussion with normal breathing effort HEART: regular rate & rhythm and no murmurs and no lower extremity edema ABDOMEN:abdomen soft, non-tender and normal bowel sounds Musculoskeletal:no cyanosis of digits and no clubbing  NEURO: alert & oriented x 3 with fluent speech, no focal motor/sensory deficits BREAST: No palpable masses or nodules in either right or left breasts. No palpable axillary supraclavicular or infraclavicular adenopathy no breast tenderness or nipple discharge.   LABORATORY DATA:  I have reviewed the data as listed   Chemistry      Component Value Date/Time   NA 143 07/14/2014 1104   NA 141 10/01/2013 1108   K 3.4* 07/14/2014 1104   K 3.3* 10/01/2013 1108   CL 102 10/01/2013 1108   CO2 28 07/14/2014 1104   CO2 28 10/01/2013 1108   BUN 14.1 07/14/2014 1104   BUN 13 10/01/2013 1108   CREATININE 1.1 07/14/2014 1104   CREATININE 0.84 10/01/2013 1108      Component Value Date/Time   CALCIUM 9.6 07/14/2014 1104   CALCIUM 9.6 10/01/2013 1108   ALKPHOS 87 07/14/2014 1104   ALKPHOS 74 09/07/2012 0908   AST 77* 07/14/2014 1104   AST 98* 09/07/2012 0908   ALT 65* 07/14/2014 1104   ALT 82* 09/07/2012 0908   BILITOT 0.66 07/14/2014 1104   BILITOT 0.8 09/07/2012 0908       Lab Results  Component Value  Date   WBC 5.8 07/14/2014   HGB 14.3 07/14/2014   HCT 42.6 07/14/2014   MCV 87.9 07/14/2014   PLT 189 07/14/2014   NEUTROABS 3.6 07/14/2014     RADIOGRAPHIC STUDIES: I have personally reviewed the radiology reports and agreed with their findings. No results found.   ASSESSMENT & PLAN:  Breast cancer of lower-outer quadrant of left female breast Left breast DCIS ER positive PR negative status post lumpectomy and radiation now on tamoxifen since April 2015 Tolerating tamoxifen extremely well without any major problems or concerns other than dryness of the skin and mild hair loss. She plans to continue tamoxifen for the total duration of 5 years. She'll be scheduled for mammogram in October 2015. I will see her back in 6 months for routine followup.  Discussed the importance of physical exercise in decreasing the likelihood of breast cancer recurrence. Recommended 30 mins daily 6 days a  week of either brisk walking or cycling or swimming. Encouraged patient to eat more fruits and vegetables and decrease red meat.      Orders Placed This Encounter  Procedures  . MM Digital Diagnostic Bilat    EPIC ORDER PF: 09/13/2013  [facility]    NO NEEDS    [facility]AMEEKA     [facility]      Standing Status: Future     Number of Occurrences:      Standing Expiration Date: 07/14/2015    Order Specific Question:  Reason for Exam (SYMPTOM  OR DIAGNOSIS REQUIRED)    Answer:  H/O left DCIS annual follow up    Order Specific Question:  Preferred imaging location?    Answer:  [facility]   The patient has a good understanding of the overall plan. she agrees with it. She will call with any problems that may develop before her next visit here.  I spent 15 minutes counseling the patient face to face. The total time spent in the appointment was 20 minutes and more than 50% was on counseling and review of test results    Rulon Eisenmenger, MD 07/14/2014 12:57 PM

## 2014-07-14 NOTE — Assessment & Plan Note (Signed)
Left breast DCIS ER positive PR negative status post lumpectomy and radiation now on tamoxifen since April 2015 Tolerating tamoxifen extremely well without any major problems or concerns other than dryness of the skin and mild hair loss. She plans to continue tamoxifen for the total duration of 5 years. She'll be scheduled for mammogram in October 2015. I will see her back in 6 months for routine followup.  Discussed the importance of physical exercise in decreasing the likelihood of breast cancer recurrence. Recommended 30 mins daily 6 days a week of either brisk walking or cycling or swimming. Encouraged patient to eat more fruits and vegetables and decrease red meat.

## 2014-07-20 ENCOUNTER — Encounter: Payer: Self-pay | Admitting: Internal Medicine

## 2014-08-05 ENCOUNTER — Ambulatory Visit: Payer: BC Managed Care – PPO

## 2014-09-05 ENCOUNTER — Other Ambulatory Visit: Payer: Self-pay | Admitting: Internal Medicine

## 2014-09-14 ENCOUNTER — Ambulatory Visit
Admission: RE | Admit: 2014-09-14 | Discharge: 2014-09-14 | Disposition: A | Discharge status: Home / Self Care | Payer: BC Managed Care – PPO | Source: Ambulatory Visit | Attending: Hematology and Oncology | Admitting: Hematology and Oncology

## 2014-09-14 ENCOUNTER — Other Ambulatory Visit: Payer: Self-pay | Admitting: Hematology and Oncology

## 2014-09-14 DIAGNOSIS — N6310 Unspecified lump in the right breast, unspecified quadrant: Secondary | ICD-10-CM

## 2014-09-14 DIAGNOSIS — C50512 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: Secondary | ICD-10-CM

## 2014-09-21 ENCOUNTER — Encounter: Payer: Self-pay | Admitting: Internal Medicine

## 2014-09-21 ENCOUNTER — Ambulatory Visit (INDEPENDENT_AMBULATORY_CARE_PROVIDER_SITE_OTHER): Payer: BC Managed Care – PPO | Admitting: Internal Medicine

## 2014-09-21 VITALS — BP 130/80 | HR 75 | Temp 97.9°F | Resp 20 | Ht 62.0 in | Wt 182.0 lb

## 2014-09-21 DIAGNOSIS — E785 Hyperlipidemia, unspecified: Secondary | ICD-10-CM

## 2014-09-21 DIAGNOSIS — C4491 Basal cell carcinoma of skin, unspecified: Secondary | ICD-10-CM

## 2014-09-21 DIAGNOSIS — I10 Essential (primary) hypertension: Secondary | ICD-10-CM

## 2014-09-21 DIAGNOSIS — C50512 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: Secondary | ICD-10-CM

## 2014-09-21 NOTE — Progress Notes (Signed)
Subjective:    Patient ID: Katie Becker, female    DOB: Aug 11, 1951, 63 y.o.   MRN: [id number]  HPI 63 year old patient who is followed closely by oncology and general surgery for left breast cancer.  She has had a recent mammogram. She has hypertension and dyslipidemia. 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  No major complaints.  Has noted a skin lesion involving her left facial area that has been present for about one year  Past Medical History  Diagnosis Date  . DIVERTICULOSIS, COLON 04/20/2007  . GERD 04/20/2007  . HEPATOMEGALY, HX OF 12/21/2009  . HYPERLIPIDEMIA 04/20/2007  . HYPERTENSION 04/20/2007  . LOW BACK PAIN 08/16/2008  . MENOPAUSAL SYNDROME 04/20/2007  . PEPTIC ULCER DISEASE 09/06/2010  . H/O hiatal hernia   . Arthritis     back  . Wears glasses   . Heart murmur     told by PCP- years ago-never had echo  . 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 Marital Status: Widowed    Spouse Name: N/A    Number of Children: 2  . Years of Education: N/A   Occupational History  .  [employer]-Cust [employer]  And  [employer]   Social History Main Topics  . Smoking status: Former Smoker    Quit date: 10/21/1978  . Smokeless tobacco: Never Used  . Alcohol Use: No  . Drug Use: No  . Sexual Activity: Not Currently   Other Topics Concern  . Not on file   Social History Narrative    Past Surgical History  Procedure Laterality Date  . Cholecystectomy    . Abdominal hysterectomy    . Tubal ligation    . Carpal tunnel release Bilateral   . Breast lumpectomy with needle localization Left 10/07/2013    Procedure: BREAST LUMPECTOMY WITH NEEDLE LOCALIZATION EXCISION OF BILATERAL AXILLARY SKIN TAGS ;  Surgeon: Edward Jolly, MD;  Location: [place];  Service: General;  Laterality: Left;  . Re-excision of breast lumpectomy Left 10/25/2013    Procedure: RE-EXCISION OF  BREAST LUMPECTOMY;  Surgeon: Edward Jolly, MD;  Location: [place];  Service: General;  Laterality: Left;  clean wound class  . Breast surgery      Family History  Problem Relation Age of Onset  . Hypertension Mother   . Hyperlipidemia Mother   . Heart disease Mother     congestive heart failure  . Cancer Father     hx of melanoma  . Hypertension Sister   . Skin cancer Sister 20    basal cell carcinoma  . Hypertension Brother   . Heart disease Maternal Grandmother 74  . Prostate cancer Cousin   . Breast cancer Maternal Aunt 60  . Colon cancer Paternal Grandmother 27  . Heart disease Maternal Aunt 42    Allergies  Allergen Reactions  . Penicillins Anaphylaxis  . Erythromycin Nausea And Vomiting  . Adhesive [Tape]     rash  . Amoxicillin     REACTION: unspecified  . Shellfish Allergy     welts  . Sulfamethoxazole Hives    REACTION: unspecified    Current Outpatient Prescriptions on File Prior to Visit  Medication Sig Dispense Refill  . atorvastatin (LIPITOR) 40 MG tablet TAKE 1 TABLET DAILY 90 tablet 1  . Calcium Carbonate-Vitamin D (CALCIUM + D PO)  Take 1 tablet by mouth 2 (two) times daily before a meal.     . cetirizine (ZYRTEC) 10 MG tablet Take 10 mg by mouth daily as needed for allergies.    Marland Kitchen esomeprazole (NEXIUM) 40 MG capsule Take 40 mg by mouth at bedtime.     . hydrochlorothiazide (HYDRODIURIL) 25 MG tablet Take 25 mg by mouth at bedtime.     . Multiple Vitamins-Minerals (WOMENS MULTIVITAMIN PLUS PO) Take 1 tablet by mouth daily.     Marland Kitchen nystatin-triamcinolone ointment (MYCOLOG) Apply 1 application topically 2 (two) times daily as needed.    . pseudoephedrine (SUDAFED) 120 MG 12 hr tablet Take 120 mg by mouth 2 (two) times daily as needed.     . pyridOXINE (VITAMIN B-6) 100 MG tablet Take 100 mg by mouth daily.    . tamoxifen (NOLVADEX) 20 MG tablet Take 20 mg by mouth daily.    . vitamin C (ASCORBIC ACID) 500 MG tablet Take 500 mg by  mouth daily.    . vitamin E 400 UNIT capsule Take 400 Units by mouth daily.     No current facility-administered medications on file prior to visit.    BP 130/80 mmHg  Pulse 75  Temp(Src) 97.9 F (36.6 C) (Oral)  Resp 20  Ht 5\' 2"  (1.575 m)  Wt 182 lb (82.555 kg)  BMI 33.28 kg/m2  SpO2 98%      Review of Systems  Constitutional: Negative.   HENT: Negative for congestion, dental problem, hearing loss, rhinorrhea, sinus pressure, sore throat and tinnitus.   Eyes: Negative for pain, discharge and visual disturbance.  Respiratory: Negative for cough and shortness of breath.   Cardiovascular: Negative for chest pain, palpitations and leg swelling.  Gastrointestinal: Negative for nausea, vomiting, abdominal pain, diarrhea, constipation, blood in stool and abdominal distention.  Genitourinary: Negative for dysuria, urgency, frequency, hematuria, flank pain, vaginal bleeding, vaginal discharge, difficulty urinating, vaginal pain and pelvic pain.  Musculoskeletal: Negative for joint swelling, arthralgias and gait problem.  Skin: Positive for wound. Negative for rash.  Neurological: Negative for dizziness, syncope, speech difficulty, weakness, numbness and headaches.  Hematological: Negative for adenopathy.  Psychiatric/Behavioral: Negative for behavioral problems, dysphoric mood and agitation. The patient is not nervous/anxious.        Objective:   Physical Exam  Constitutional: She is oriented to person, place, and time. She appears well-developed and well-nourished.  HENT:  Head: Normocephalic.  Right Ear: External ear normal.  Left Ear: External ear normal.  Mouth/Throat: Oropharynx is clear and moist.  Eyes: Conjunctivae and EOM are normal. Pupils are equal, round, and reactive to light.  Neck: Normal range of motion. Neck supple. No thyromegaly present.  Cardiovascular: Normal rate, regular rhythm, normal heart sounds and intact distal pulses.   Pulmonary/Chest: Effort  normal and breath sounds normal.  Abdominal: Soft. Bowel sounds are normal. She exhibits no mass. There is no tenderness.  Musculoskeletal: Normal range of motion.  Lymphadenopathy:    She has no cervical adenopathy.  Neurological: She is alert and oriented to person, place, and time.  Skin: Skin is warm and dry. No rash noted.  Nonspecific small excoriated papule.  Left facial area  Psychiatric: She has a normal mood and affect. Her behavior is normal.          Assessment & Plan:  Hypertension, well-controlled Dyslipidemia.  Continue statin therapy Rule out BCE left facial area.  Refer dermatology  CPX one year Follow-up oncology

## 2014-09-21 NOTE — Patient Instructions (Signed)
Limit your sodium (Salt) intake    It is important that you exercise regularly, at least 20 minutes 3 to 4 times per week.  If you develop chest pain or shortness of breath seek  medical attention.  Return in one year for follow-up   

## 2014-09-21 NOTE — Progress Notes (Signed)
Pre visit review using our clinic review tool, if applicable. No additional management support is needed unless otherwise documented below in the visit note. 

## 2014-10-15 ENCOUNTER — Other Ambulatory Visit: Payer: Self-pay | Admitting: Internal Medicine

## 2014-10-18 ENCOUNTER — Other Ambulatory Visit: Payer: Self-pay | Admitting: Internal Medicine

## 2014-11-07 ENCOUNTER — Encounter: Payer: Self-pay | Admitting: Internal Medicine

## 2014-11-09 MED ORDER — ATORVASTATIN CALCIUM 40 MG PO TABS: 40.0000 mg | ORAL_TABLET | Freq: Every day | ORAL | Status: DC

## 2014-11-09 MED ORDER — ESOMEPRAZOLE MAGNESIUM 40 MG PO CPDR: 40.0000 mg | DELAYED_RELEASE_CAPSULE | Freq: Every day | ORAL | Status: DC

## 2014-11-09 MED ORDER — HYDROCHLOROTHIAZIDE 25 MG PO TABS: 25.0000 mg | ORAL_TABLET | Freq: Every day | ORAL | Status: DC

## 2014-11-09 MED ORDER — TAMOXIFEN CITRATE 20 MG PO TABS: 20.0000 mg | ORAL_TABLET | Freq: Every day | ORAL | Status: DC

## 2014-11-21 ENCOUNTER — Telehealth: Payer: Self-pay | Admitting: Internal Medicine

## 2014-11-21 NOTE — Telephone Encounter (Signed)
Katie Becker (who is your pt ), her brother in law has moved up from [place] and living w/ her. He is a medicare pt,  She states he is having a hard time breathing at night and needs to see somoene asap and wants to know if you will accept him as a pt, and see him asap. Her sister has moved up here too and will probably want to see you as well.  Will you accept her brother in law now?

## 2014-11-21 NOTE — Telephone Encounter (Signed)
I am not taking any new patients.

## 2014-11-22 ENCOUNTER — Encounter: Payer: Self-pay | Admitting: Internal Medicine

## 2014-11-24 NOTE — Telephone Encounter (Signed)
Pt aware.

## 2015-01-12 ENCOUNTER — Telehealth: Payer: Self-pay | Admitting: Hematology and Oncology

## 2015-01-12 ENCOUNTER — Ambulatory Visit (HOSPITAL_BASED_OUTPATIENT_CLINIC_OR_DEPARTMENT_OTHER): Payer: BLUE CROSS/BLUE SHIELD | Admitting: Hematology and Oncology

## 2015-01-12 VITALS — BP 131/63 | HR 76 | Temp 98.0°F | Resp 18 | Ht 62.0 in | Wt 184.6 lb

## 2015-01-12 DIAGNOSIS — C50512 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: Secondary | ICD-10-CM

## 2015-01-12 DIAGNOSIS — Z853 Personal history of malignant neoplasm of breast: Secondary | ICD-10-CM

## 2015-01-12 MED ORDER — ANASTROZOLE 1 MG PO TABS: 1.0000 mg | ORAL_TABLET | Freq: Every day | ORAL | Status: DC

## 2015-01-12 NOTE — Assessment & Plan Note (Signed)
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 Dryness of skin  2. Mild hair loss   Breast cancer surveillance: 1. Breast exam 01/12/2015 was normal 2. Mammogram 09/14/2014 revealed right breast cysts otherwise no evidence of cancer.  Survivorship: Once again stressed importance of exercise and eating healthy with more fruits and vegetables and less red meat.  Return to clinic in 6 months for follow-up.

## 2015-01-12 NOTE — Progress Notes (Signed)
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   Staging form: Breast, AJCC 7th Edition     Clinical: Stage 0 (Tis (DCIS), N0, cM0) - Unsigned       Staging comments: Staged at breast conference 11.12.14      Pathologic: No stage assigned - Unsigned   SUMMARY OF ONCOLOGIC HISTORY:   Breast cancer of lower-outer quadrant of left female breast   10/07/2013 Surgery Left breast lumpectomy: DCIS ER positive PR positive, reexcision of positive margins on 10/25/2013, final margins negative   11/08/2013 - 12/27/2013 Radiation Therapy Adjuvant radiation therapy   01/03/2014 Procedure Genetic testing negative for breast and ovarian cancer panels   01/24/2014 -  Anti-estrogen oral therapy Tamoxifen 20 mg daily 5 years    CHIEF COMPLIANT: Follow-up on tamoxifen therapy for left breast cancer  INTERVAL HISTORY: Katie Becker is a 64 year old lady with above-mentioned history left-sided breast cancer treated with lumpectomy radiation and is now on tamoxifen since April 2015. She is tolerating tamoxifen fairly well without any major problems or concerns. Denies any hot flashes or myalgias. Denies any lumps or nodules in the breasts.  REVIEW OF SYSTEMS:   Constitutional: Denies fevers, chills or abnormal weight loss Eyes: Denies blurriness of vision Ears, nose, mouth, throat, and face: Denies mucositis or sore throat Respiratory: Denies cough, dyspnea or wheezes Cardiovascular: Denies palpitation, chest discomfort or lower extremity swelling Gastrointestinal:  Denies nausea, heartburn or change in bowel habits Skin: Denies abnormal skin rashes Lymphatics: Denies new lymphadenopathy or easy bruising Neurological:Denies numbness, tingling or new weaknesses Behavioral/Psych: Mood is stable, no new changes  Breast:  denies any pain or lumps or nodules in either breasts All other systems were reviewed with the patient and  are negative.  I have reviewed the past medical history, past surgical history, social history and family history with the patient and they are unchanged from previous note.  ALLERGIES:  is allergic to penicillins; erythromycin; adhesive; amoxicillin; shellfish allergy; and sulfamethoxazole.  MEDICATIONS:  Current Outpatient Prescriptions  Medication Sig Dispense Refill  . atorvastatin (LIPITOR) 40 MG tablet Take 1 tablet (40 mg total) by mouth daily. 90 tablet 1  . Calcium Carbonate-Vitamin D (CALCIUM + D PO) Take 1 tablet by mouth 2 (two) times daily before a meal.     . cetirizine (ZYRTEC) 10 MG tablet Take 10 mg by mouth daily as needed for allergies.    Marland Kitchen esomeprazole (NEXIUM) 40 MG capsule Take 1 capsule (40 mg total) by mouth daily. 90 capsule 3  . hydrochlorothiazide (HYDRODIURIL) 25 MG tablet Take 1 tablet (25 mg total) by mouth daily. 90 tablet 3  . Multiple Vitamins-Minerals (WOMENS MULTIVITAMIN PLUS PO) Take 1 tablet by mouth daily.     Marland Kitchen nystatin-triamcinolone ointment (MYCOLOG) Apply 1 application topically 2 (two) times daily as needed.    . pseudoephedrine (SUDAFED) 120 MG 12 hr tablet Take 120 mg by mouth 2 (two) times daily as needed.     . pyridOXINE (VITAMIN B-6) 100 MG tablet Take 100 mg by mouth daily.    . tamoxifen (NOLVADEX) 20 MG tablet Take 1 tablet (20 mg total) by mouth daily. 90 tablet 1  . vitamin C (ASCORBIC ACID) 500 MG tablet Take 500 mg by mouth daily.    . vitamin E 400 UNIT capsule Take 400 Units by mouth daily.    Marland Kitchen tobramycin-dexamethasone (TOBRADEX) ophthalmic solution   0  No current facility-administered medications for this visit.    PHYSICAL EXAMINATION: ECOG PERFORMANCE STATUS: 0 - Asymptomatic  There were no vitals filed for this visit. There were no vitals filed for this visit.  GENERAL:alert, no distress and comfortable SKIN: skin color, texture, turgor are normal, no rashes or significant lesions EYES: normal, Conjunctiva are pink  and non-injected, sclera clear OROPHARYNX:no exudate, no erythema and lips, buccal mucosa, and tongue normal  NECK: supple, thyroid normal size, non-tender, without nodularity LYMPH:  no palpable lymphadenopathy in the cervical, axillary or inguinal LUNGS: clear to auscultation and percussion with normal breathing effort HEART: regular rate & rhythm and no murmurs and no lower extremity edema ABDOMEN:abdomen soft, non-tender and normal bowel sounds Musculoskeletal:no cyanosis of digits and no clubbing  NEURO: alert & oriented x 3 with fluent speech, no focal motor/sensory deficits BREAST: No palpable masses or nodules in either right or left breasts. No palpable axillary supraclavicular or infraclavicular adenopathy no breast tenderness or nipple discharge. (exam performed in the presence of a chaperone)  LABORATORY DATA:  I have reviewed the data as listed   Chemistry      Component Value Date/Time   NA 143 07/14/2014 1104   NA 141 10/01/2013 1108   K 3.4* 07/14/2014 1104   K 3.3* 10/01/2013 1108   CL 102 10/01/2013 1108   CO2 28 07/14/2014 1104   CO2 28 10/01/2013 1108   BUN 14.1 07/14/2014 1104   BUN 13 10/01/2013 1108   CREATININE 1.1 07/14/2014 1104   CREATININE 0.84 10/01/2013 1108      Component Value Date/Time   CALCIUM 9.6 07/14/2014 1104   CALCIUM 9.6 10/01/2013 1108   ALKPHOS 87 07/14/2014 1104   ALKPHOS 74 09/07/2012 0908   AST 77* 07/14/2014 1104   AST 98* 09/07/2012 0908   ALT 65* 07/14/2014 1104   ALT 82* 09/07/2012 0908   BILITOT 0.66 07/14/2014 1104   BILITOT 0.8 09/07/2012 0908       Lab Results  Component Value Date   WBC 5.8 07/14/2014   HGB 14.3 07/14/2014   HCT 42.6 07/14/2014   MCV 87.9 07/14/2014   PLT 189 07/14/2014   NEUTROABS 3.6 07/14/2014   RADIOGRAPHIC STUDIES: I have personally reviewed the radiology reports and agreed with their findings. 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 Dryness of skin  2. Mild hair loss   Since we changed her treatment from tamoxifen to anastrozole we will see her back in 3 months to assess how she tolerates anastrozole.  Survivorship: Once again stressed importance of exercise and eating healthy with more fruits and vegetables and less red meat.  Return to clinic in 3 months for follow-up.  No orders of the defined types were placed in this encounter.   The patient has a good understanding of the overall plan. she agrees with it. She will call with any problems that may develop before her next visit here.   Rulon Eisenmenger, MD

## 2015-01-12 NOTE — Telephone Encounter (Signed)
per pof to sch pt appt-gave pt copy of sch °

## 2015-02-06 ENCOUNTER — Encounter: Payer: Self-pay | Admitting: Family Medicine

## 2015-02-06 ENCOUNTER — Ambulatory Visit (INDEPENDENT_AMBULATORY_CARE_PROVIDER_SITE_OTHER): Payer: BLUE CROSS/BLUE SHIELD | Admitting: Family Medicine

## 2015-02-06 VITALS — BP 130/78 | HR 85 | Temp 98.1°F | Wt 186.0 lb

## 2015-02-06 DIAGNOSIS — J209 Acute bronchitis, unspecified: Secondary | ICD-10-CM

## 2015-02-06 NOTE — Progress Notes (Signed)
   Subjective:    Patient ID: Katie Becker, female    DOB: 05/12/1951, 64 y.o.   MRN: [id number]  HPI Acute visit. Ex-smoker who is seen with about six-day history of cough. Occasionally productive. She's had some soreness substernal area with coughing. No fevers or chills. No hemoptysis. No dyspnea. She's taken Zyrtec and Mucinex DM with some relief. No pleuritic pain.  Past Medical History  Diagnosis Date  . DIVERTICULOSIS, COLON 04/20/2007  . GERD 04/20/2007  . HEPATOMEGALY, HX OF 12/21/2009  . HYPERLIPIDEMIA 04/20/2007  . HYPERTENSION 04/20/2007  . LOW BACK PAIN 08/16/2008  . MENOPAUSAL SYNDROME 04/20/2007  . PEPTIC ULCER DISEASE 09/06/2010  . H/O hiatal hernia   . Arthritis     back  . Wears glasses   . Heart murmur     told by PCP- years ago-never had echo  . Breast cancer     Left Breast - ductal Carcinoma In-Situ - diagnosed on MRI- biopsy   Past Surgical History  Procedure Laterality Date  . Cholecystectomy    . Abdominal hysterectomy    . Tubal ligation    . Carpal tunnel release Bilateral   . Breast lumpectomy with needle localization Left 10/07/2013    Procedure: BREAST LUMPECTOMY WITH NEEDLE LOCALIZATION EXCISION OF BILATERAL AXILLARY SKIN TAGS ;  Surgeon: Edward Jolly, MD;  Location: [facility];  Service: General;  Laterality: Left;  . Re-excision of breast lumpectomy Left 10/25/2013    Procedure: RE-EXCISION OF BREAST LUMPECTOMY;  Surgeon: Edward Jolly, MD;  Location: [facility];  Service: General;  Laterality: Left;  clean wound class  . Breast surgery      reports that she quit smoking about 36 years ago. She has never used smokeless tobacco. She reports that she does not drink alcohol or use illicit drugs. family history includes Breast cancer (age of onset: 55) in her maternal aunt; Cancer in her father; Colon cancer (age of onset: 66) in her paternal grandmother; Heart disease in her mother; Heart disease (age of onset: 65) in her maternal  aunt; Heart disease (age of onset: 64) in her maternal grandmother; Hyperlipidemia in her mother; Hypertension in her brother, mother, and sister; Prostate cancer in her cousin; Skin cancer (age of onset: 20) in her sister. Allergies  Allergen Reactions  . Penicillins Anaphylaxis  . Erythromycin Nausea And Vomiting  . Adhesive [Tape]     rash  . Amoxicillin     REACTION: unspecified  . Shellfish Allergy     welts  . Sulfamethoxazole Hives    REACTION: unspecified      Review of Systems  Constitutional: Negative for fever and chills.  HENT: Positive for congestion.   Respiratory: Positive for cough. Negative for shortness of breath and wheezing.   Cardiovascular: Negative for chest pain, palpitations and leg swelling.       Objective:   Physical Exam  Constitutional: She appears well-developed and well-nourished. No distress.  HENT:  Right Ear: External ear normal.  Left Ear: External ear normal.  Mouth/Throat: Oropharynx is clear and moist.  Neck: Neck supple.  Cardiovascular: Normal rate and regular rhythm.   Pulmonary/Chest: Effort normal and breath sounds normal. No respiratory distress. She has no wheezes. She has no rales.  Lymphadenopathy:    She has no cervical adenopathy.          Assessment & Plan:  Cough. Suspect acute viral bronchitis. Afebrile. Nonfocal exam. Continue symptomatic treatment. Follow-up for fever or change of symptoms

## 2015-02-06 NOTE — Progress Notes (Signed)
Pre visit review using our clinic review tool, if applicable. No additional management support is needed unless otherwise documented below in the visit note. 

## 2015-02-06 NOTE — Patient Instructions (Signed)

## 2015-02-08 ENCOUNTER — Encounter: Payer: Self-pay | Admitting: Family Medicine

## 2015-02-28 ENCOUNTER — Encounter: Payer: Self-pay | Admitting: Internal Medicine

## 2015-03-21 ENCOUNTER — Other Ambulatory Visit: Payer: Self-pay | Admitting: Obstetrics and Gynecology

## 2015-03-22 ENCOUNTER — Other Ambulatory Visit: Payer: Self-pay | Admitting: Obstetrics and Gynecology

## 2015-03-22 DIAGNOSIS — M8580 Other specified disorders of bone density and structure, unspecified site: Secondary | ICD-10-CM

## 2015-03-22 DIAGNOSIS — Z780 Asymptomatic menopausal state: Secondary | ICD-10-CM

## 2015-03-22 LAB — CYTOLOGY - PAP

## 2015-04-03 ENCOUNTER — Ambulatory Visit
Admission: RE | Admit: 2015-04-03 | Discharge: 2015-04-03 | Disposition: A | Discharge status: Home / Self Care | Payer: BLUE CROSS/BLUE SHIELD | Source: Ambulatory Visit | Attending: Obstetrics and Gynecology | Admitting: Obstetrics and Gynecology

## 2015-04-03 DIAGNOSIS — M8580 Other specified disorders of bone density and structure, unspecified site: Secondary | ICD-10-CM

## 2015-04-03 DIAGNOSIS — Z780 Asymptomatic menopausal state: Secondary | ICD-10-CM

## 2015-04-05 ENCOUNTER — Encounter: Payer: Self-pay | Admitting: Internal Medicine

## 2015-04-16 NOTE — Assessment & Plan Note (Signed)
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 Dryness of skin  2. Mild hair loss   Since we changed her treatment from tamoxifen to anastrozole we will see her back in 3 months to assess how she tolerates anastrozole.  Survivorship: Once again stressed importance of exercise and eating healthy with more fruits and vegetables and less red meat.  Return to clinic in 6 months for follow-up.

## 2015-04-17 ENCOUNTER — Encounter: Payer: Self-pay | Admitting: Hematology and Oncology

## 2015-04-17 ENCOUNTER — Telehealth: Payer: Self-pay | Admitting: Hematology and Oncology

## 2015-04-17 ENCOUNTER — Ambulatory Visit (HOSPITAL_BASED_OUTPATIENT_CLINIC_OR_DEPARTMENT_OTHER): Payer: BLUE CROSS/BLUE SHIELD | Admitting: Hematology and Oncology

## 2015-04-17 VITALS — BP 117/59 | HR 82 | Temp 98.6°F | Resp 18 | Wt 188.4 lb

## 2015-04-17 DIAGNOSIS — Z79811 Long term (current) use of aromatase inhibitors: Secondary | ICD-10-CM

## 2015-04-17 DIAGNOSIS — C50512 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: Secondary | ICD-10-CM | POA: Diagnosis not present

## 2015-04-17 DIAGNOSIS — Z79810 Long term (current) use of selective estrogen receptor modulators (SERMs): Secondary | ICD-10-CM

## 2015-04-17 DIAGNOSIS — D0512 Intraductal carcinoma in situ of left breast: Secondary | ICD-10-CM | POA: Diagnosis not present

## 2015-04-17 DIAGNOSIS — Z923 Personal history of irradiation: Secondary | ICD-10-CM | POA: Diagnosis not present

## 2015-04-17 DIAGNOSIS — Z170 Estrogen receptor positive status [ER+]: Secondary | ICD-10-CM

## 2015-04-17 NOTE — Progress Notes (Signed)
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   Staging form: Breast, AJCC 7th Edition     Clinical: Stage 0 (Tis (DCIS), N0, cM0) - Unsigned       Staging comments: Staged at breast conference 11.12.14      Pathologic: No stage assigned - Unsigned   SUMMARY OF ONCOLOGIC HISTORY:   Breast cancer of lower-outer quadrant of left female breast   10/07/2013 Surgery Left breast lumpectomy: DCIS ER positive PR positive, reexcision of positive margins on 10/25/2013, final margins negative   11/08/2013 - 12/27/2013 Radiation Therapy Adjuvant radiation therapy   01/03/2014 Procedure Genetic testing negative for breast and ovarian cancer panels   01/24/2014 - 10/16/2014 Anti-estrogen oral therapy Tamoxifen 20 mg daily 5 years   09/13/2014 Relapse/Recurrence MRI of the breast revealed a 3.5 cm area of non-mass enhancement left breast lateral 3.5 x 1.9 x 1.6 cm, oval circumscribed T2 bright enhancing mass right breast upper outer quadrant 1 x 0.7 cm, linear non-mass enhancement 11 x 6 x 3 mm   10/07/2014 Surgery Left breast lumpectomy: DCIS grade 10.6 cm ER 100%, PR 0%, multiple skin tags benign fibroepithelial polyps, reexcision 10/25/2013 benign fibrocystic changes with calcifications   12/27/2014 -  Anti-estrogen oral therapy Anastrozole 1 mg daily    CHIEF COMPLIANT: Follow-up on anastrozole  INTERVAL HISTORY: Katie Becker is a 64 year old with above-mentioned history of left breast cancer who was diagnosed with recurrence December 2015 of DCIS. Because of this we changed her treatment from tamoxifen to anastrozole. She is tolerating anastrozole fairly well without any major problems or concerns.  REVIEW OF SYSTEMS:   Constitutional: Denies fevers, chills or abnormal weight loss Eyes: Denies blurriness of vision Ears, nose, mouth, throat, and face: Denies mucositis or sore throat Respiratory: Denies cough, dyspnea  or wheezes Cardiovascular: Denies palpitation, chest discomfort or lower extremity swelling Gastrointestinal:  Denies nausea, heartburn or change in bowel habits Skin: Denies abnormal skin rashes Lymphatics: Denies new lymphadenopathy or easy bruising Neurological:Denies numbness, tingling or new weaknesses Behavioral/Psych: Mood is stable, no new changes  All other systems were reviewed with the patient and are negative.  I have reviewed the past medical history, past surgical history, social history and family history with the patient and they are unchanged from previous note.  ALLERGIES:  is allergic to penicillins; erythromycin; adhesive; amoxicillin; shellfish allergy; and sulfamethoxazole.  MEDICATIONS:  Current Outpatient Prescriptions  Medication Sig Dispense Refill  . anastrozole (ARIMIDEX) 1 MG tablet Take 1 tablet (1 mg total) by mouth daily. 90 tablet 3  . atorvastatin (LIPITOR) 40 MG tablet Take 1 tablet (40 mg total) by mouth daily. 90 tablet 1  . Calcium Carbonate-Vitamin D (CALCIUM + D PO) Take 1 tablet by mouth 2 (two) times daily before a meal.     . cetirizine (ZYRTEC) 10 MG tablet Take 10 mg by mouth daily as needed for allergies.    Marland Kitchen esomeprazole (NEXIUM) 40 MG capsule Take 1 capsule (40 mg total) by mouth daily. 90 capsule 3  . hydrochlorothiazide (HYDRODIURIL) 25 MG tablet Take 1 tablet (25 mg total) by mouth daily. 90 tablet 3  . Multiple Vitamins-Minerals (WOMENS MULTIVITAMIN PLUS PO) Take 1 tablet by mouth daily.     Marland Kitchen nystatin-triamcinolone ointment (MYCOLOG) Apply 1 application topically 2 (two) times daily as needed.    . pseudoephedrine (SUDAFED) 120 MG 12 hr tablet Take 120 mg by mouth 2 (two)  times daily as needed.     . tobramycin-dexamethasone (TOBRADEX) ophthalmic solution   0   No current facility-administered medications for this visit.    PHYSICAL EXAMINATION: ECOG PERFORMANCE STATUS: 0 - Asymptomatic GENERAL:alert, no distress and  comfortable SKIN: skin color, texture, turgor are normal, no rashes or significant lesions EYES: normal, Conjunctiva are pink and non-injected, sclera clear OROPHARYNX:no exudate, no erythema and lips, buccal mucosa, and tongue normal  NECK: supple, thyroid normal size, non-tender, without nodularity LYMPH:  no palpable lymphadenopathy in the cervical, axillary or inguinal LUNGS: clear to auscultation and percussion with normal breathing effort HEART: regular rate & rhythm and no murmurs and no lower extremity edema ABDOMEN:abdomen soft, non-tender and normal bowel sounds Musculoskeletal:no cyanosis of digits and no clubbing  NEURO: alert & oriented x 3 with fluent speech, no focal motor/sensory deficits  LABORATORY DATA:  I have reviewed the data as listed   Chemistry      Component Value Date/Time   NA 143 07/14/2014 1104   NA 141 10/01/2013 1108   K 3.4* 07/14/2014 1104   K 3.3* 10/01/2013 1108   CL 102 10/01/2013 1108   CO2 28 07/14/2014 1104   CO2 28 10/01/2013 1108   BUN 14.1 07/14/2014 1104   BUN 13 10/01/2013 1108   CREATININE 1.1 07/14/2014 1104   CREATININE 0.84 10/01/2013 1108      Component Value Date/Time   CALCIUM 9.6 07/14/2014 1104   CALCIUM 9.6 10/01/2013 1108   ALKPHOS 87 07/14/2014 1104   ALKPHOS 74 09/07/2012 0908   AST 77* 07/14/2014 1104   AST 98* 09/07/2012 0908   ALT 65* 07/14/2014 1104   ALT 82* 09/07/2012 0908   BILITOT 0.66 07/14/2014 1104   BILITOT 0.8 09/07/2012 0908       Lab Results  Component Value Date   WBC 5.8 07/14/2014   HGB 14.3 07/14/2014   HCT 42.6 07/14/2014   MCV 87.9 07/14/2014   PLT 189 07/14/2014   NEUTROABS 3.6 07/14/2014   ASSESSMENT & PLAN:  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 Dryness of skin  2. Mild hair loss   Since we changed her treatment from tamoxifen to anastrozole we will see her back in 3 months to assess how she tolerates anastrozole.  Breast Cancer Surveillance:  1. Breast exam every 4-6 months and Mammograms once a year most likely November 2016. 2. Bone density 04/04/2015: Normal bone density  Survivorship: Discussed the importance of physical exercise in decreasing the likelihood of breast cancer recurrence. Recommended 30 mins daily 6 days a week of either brisk walking or cycling or swimming. Encouraged patient to eat more fruits and vegetables and decrease red meat.  Patient and her sister take care of her ailing mother age 21 and requires 24/7 home care. This does not give her much time to do exercise. She will try to make some more time to get some activity.  Return to clinic in 6 months for follow-up.   No orders of the defined types were placed in this encounter.   The patient has a good understanding of the overall plan. she agrees with it. she will call with any problems that may develop before the next visit here.   Rulon Eisenmenger,  MD

## 2015-04-17 NOTE — Telephone Encounter (Signed)
Appointments made and avs printed for patient °

## 2015-04-17 NOTE — Addendum Note (Signed)
Addended by: Prentiss Bells on: 04/17/2015 09:33 AM   Modules accepted: Medications

## 2015-04-18 ENCOUNTER — Other Ambulatory Visit: Payer: Self-pay | Admitting: Internal Medicine

## 2015-04-25 ENCOUNTER — Other Ambulatory Visit: Payer: Self-pay | Admitting: Obstetrics and Gynecology

## 2015-05-04 ENCOUNTER — Telehealth: Payer: Self-pay | Admitting: *Deleted

## 2015-05-04 ENCOUNTER — Encounter: Payer: Self-pay | Admitting: Internal Medicine

## 2015-05-04 NOTE — Telephone Encounter (Signed)
History questionnaire submitted on Thursday, May 04, 2015 at 3:16:39 AM   Questionnaire: History   Patient: Katie Becker [[id number]]         Medical History:      Question: Allergies   Response: Yes   Date:  Comments:       Question: Depression   Response: No   Date:  Comments:       Question: Myocardial infarction   Response: No   Date:  Comments:       Question: Anemia   Response: No   Date:  Comments:       Question: Diabetes mellitus   Response: No   Date:  Comments:       Question: Nerve / muscle disease   Response: No   Date:  Comments:       Question: Anxiety   Response: No   Date:  Comments:       Question: Emphysema   Response: No   Date:  Comments:       Question: Brittle bones   Response: No   Date:  Comments:       Question: Arthritis   Response: Yes   Date:  Comments:       Question: Acid reflux   Response: Yes   Date: 04/20/2007 Comments:       Question: Seizures   Response: No   Date:  Comments:       Question: Asthma   Response: No   Date:  Comments:       Question: Glaucoma   Response: No   Date:  Comments:       Question: Sickle cell anemia   Response: No   Date:  Comments:       Question: Blood transfusion   Response: No   Date:  Comments:       Question: Heart murmur   Response: Yes   Date:  Comments:       Question: Stroke   Response: No   Date:  Comments:       Question: Cancer   Response: Yes   Date:  Comments:       Question: HIV/AIDS   Response: No   Date:  Comments:       Question: Substance abuse   Response: No   Date:  Comments:       Question: Cataracts   Response: Yes   Date: 2015 Comments:       Question: Hyperlipidemia   Response: Yes   Date: 04/20/2007 Comments:       Question: Thyroid disease   Response: No   Date:  Comments:       Question: Bleeding  problem   Response: No   Date:  Comments:       Question: High blood pressure   Response: Yes   Date: 04/20/2007 Comments:       Question: Tuberculosis   Response: No   Date:  Comments:       Question: Congestive heart failure   Response: No   Date:  Comments:       Question: Kidney disease   Response: No   Date:  Comments:       Question: Ulcers   Response: Yes   Date: 09/06/2010 Comments:       Question: COPD / chronic bronchitis   Response: No   Date:  Comments:       Question: History of Sleep Apnea   Response: No  Date:  Comments:       Question: On Home Oxygen Therapy   Response: No   Date:  Comments:             Surgical History:      Question: Appendectomy   Response: No   Date:  Comments:       Question: Plastic surgery   Response: No   Date:  Comments:       Question: Joint replacement   Response: No   Date:  Comments:       Question: Brain surgery   Response: No   Date:  Comments:       Question: C-Section   Response: No   Date:  Comments:       Question: Small intestine surgery   Response: No   Date:  Comments:       Question: Breast surgery   Response: Yes   Date:  Comments:       Question: Eye surgery   Response: No   Date:  Comments:       Question: Spine surgery   Response: No   Date:  Comments:       Question: Heart bypass   Response: No   Date:  Comments:       Question: Fracture surgery   Response: No   Date:  Comments:       Question: Tubes tied   Response: Yes   Date:  Comments:       Question: Gall bladder removal   Response: Yes   Date:  Comments:       Question: Hernia repair   Response: No   Date:  Comments:       Question: Heart valve replacement   Response: No   Date:  Comments:       Question: Colon / large intestine surgery   Response: No   Date:  Comments:        Question: Hysterectomy   Response: Yes   Date:  Comments:             Family History:      Problem: Cancer      Relation: Father   Name:    Comments:       Problem: Heart disease      Relation: Mother   Name:    Comments:       Relation: Maternal Grandmother   Name:    Comments:       Relation: Maternal Aunt   Name:    Comments:       Problem: Hyperlipidemia      Relation: Mother   Name:    Comments:       Problem: Hypertension      Relation: Mother   Name:    Comments:       Relation: Sister   Name:    Comments:       Relation: Brother   Name:    Comments:          Social History:      Question: Sexually Active   Response: Not Currently   Birth Control / Protection:    Comments:       Question: Tobacco Use (if former smoker, must enter    quit date)   Response: Former Smoker   Types:    Packs/day:    Years:    Quit Date: 10/21/1978   Question: Ready to Quit   Response: No Response   Comments:  Question: Drug Use   Response: No      Question: Alcohol Use   Response: No

## 2015-06-02 ENCOUNTER — Ambulatory Visit (AMBULATORY_SURGERY_CENTER): Payer: Self-pay

## 2015-06-02 ENCOUNTER — Encounter: Payer: Self-pay | Admitting: Internal Medicine

## 2015-06-02 VITALS — Ht 62.0 in | Wt 187.2 lb

## 2015-06-02 DIAGNOSIS — Z800 Family history of malignant neoplasm of digestive organs: Secondary | ICD-10-CM

## 2015-06-02 NOTE — Progress Notes (Signed)
No allergies to eggs or soy No diet/weight loss meds No home oxygen No past problems with anesthesia  Refused emmi 

## 2015-06-15 ENCOUNTER — Ambulatory Visit (AMBULATORY_SURGERY_CENTER): Payer: BLUE CROSS/BLUE SHIELD | Admitting: Internal Medicine

## 2015-06-15 ENCOUNTER — Encounter: Payer: Self-pay | Admitting: Internal Medicine

## 2015-06-15 VITALS — BP 111/55 | HR 85 | Temp 97.6°F | Resp 46 | Ht 62.0 in | Wt 187.0 lb

## 2015-06-15 DIAGNOSIS — Z1211 Encounter for screening for malignant neoplasm of colon: Secondary | ICD-10-CM

## 2015-06-15 MED ORDER — SODIUM CHLORIDE 0.9 % IV SOLN: 500.0000 mL | INTRAVENOUS | Status: DC

## 2015-06-15 NOTE — Op Note (Signed)
[place]  [address]. [place], [id number]   COLONOSCOPY PROCEDURE REPORT  PATIENT: Katie Becker, Katie Becker  MR#: [id number] BIRTHDATE: January 25, 1951 , 64  yrs. old GENDER: female ENDOSCOPIST: Gatha Mayer, MD, [facility] PROCEDURE DATE:  06/15/2015 PROCEDURE:   Colonoscopy, screening First Screening Colonoscopy - Avg.  risk and is 50 yrs.  old or older - No.  Prior Negative Screening - Now for repeat screening. 10 or more years since last screening  History of Adenoma - Now for follow-up colonoscopy & has been > or = to 3 yrs.  N/A  Polyps removed today? No Recommend repeat exam, <10 yrs? No ASA CLASS:   Class II INDICATIONS:Screening for colonic neoplasia and Colorectal Neoplasm Risk Assessment for this procedure is average risk. MEDICATIONS: Propofol 330 mg IV and Monitored anesthesia care  DESCRIPTION OF PROCEDURE:   After the risks benefits and alternatives of the procedure were thoroughly explained, informed consent was obtained.  The digital rectal exam revealed no abnormalities of the rectum.   The LB [id number]  endoscope was introduced through the anus and advanced to the cecum, which was identified by both the appendix and ileocecal valve. No adverse events experienced.   The quality of the prep was excellent. (MiraLax was used)  The instrument was then slowly withdrawn as the colon was fully examined. Estimated blood loss is zero unless otherwise noted in this procedure report.      COLON FINDINGS: There was mild diverticulosis noted in the sigmoid colon.   The examination was otherwise normal.  Retroflexed views revealed no abnormalities. The time to cecum = 8.1 Withdrawal time = 7.2   The scope was withdrawn and the procedure completed. COMPLICATIONS: There were no immediate complications.  ENDOSCOPIC IMPRESSION: 1.   Mild diverticulosis was noted in the sigmoid colon 2.   The examination was otherwise normal - excellent prep  second screening  RECOMMENDATIONS: Repeat colonoscopy/screening test in 2026 - 10 years.  eSigned:  Gatha Mayer, MD, [facility] 06/15/2015 12:06 PM   cc: The Patient

## 2015-06-15 NOTE — Progress Notes (Signed)
Report to PACU, RN, vss, BBS= Clear.  

## 2015-06-15 NOTE — Patient Instructions (Addendum)
No polyps or cancer detected!  Next routine colonoscopy/screening test in 10 years - 2026  I appreciate the opportunity to care for you. Gatha Mayer, MD, FACG  YOU HAD AN ENDOSCOPIC PROCEDURE TODAY AT [facility]:   Refer to the procedure report that was given to you for any specific questions about what was found during the examination.  If the procedure report does not answer your questions, please call your gastroenterologist to clarify.  If you requested that your care partner not be given the details of your procedure findings, then the procedure report has been included in a sealed envelope for you to review at your convenience later.  YOU SHOULD EXPECT: Some feelings of bloating in the abdomen. Passage of more gas than usual.  Walking can help get rid of the air that was put into your GI tract during the procedure and reduce the bloating. If you had a lower endoscopy (such as a colonoscopy or flexible sigmoidoscopy) you may notice spotting of blood in your stool or on the toilet paper. If you underwent a bowel prep for your procedure, you may not have a normal bowel movement for a few days.  Please Note:  You might notice some irritation and congestion in your nose or some drainage.  This is from the oxygen used during your procedure.  There is no need for concern and it should clear up in a day or so.  SYMPTOMS TO REPORT IMMEDIATELY:   Following lower endoscopy (colonoscopy or flexible sigmoidoscopy):  Excessive amounts of blood in the stool  Significant tenderness or worsening of abdominal pains  Swelling of the abdomen that is new, acute  Fever of 100F or higher   Following upper endoscopy (EGD)  Vomiting of blood or coffee ground material  New chest pain or pain under the shoulder blades  Painful or persistently difficult swallowing  New shortness of breath  Fever of 100F or higher  Black, tarry-looking stools  For urgent or emergent issues, a  gastroenterologist can be reached at any hour by calling [phone].   DIET: Your first meal following the procedure should be a small meal and then it is ok to progress to your normal diet. Heavy or fried foods are harder to digest and may make you feel nauseous or bloated.  Likewise, meals heavy in dairy and vegetables can increase bloating.  Drink plenty of fluids but you should avoid alcoholic beverages for 24 hours.  ACTIVITY:  You should plan to take it easy for the rest of today and you should NOT DRIVE or use heavy machinery until tomorrow (because of the sedation medicines used during the test).    FOLLOW UP: Our staff will call the number listed on your records the next business day following your procedure to check on you and address any questions or concerns that you may have regarding the information given to you following your procedure. If we do not reach you, we will leave a message.  However, if you are feeling well and you are not experiencing any problems, there is no need to return our call.  We will assume that you have returned to your regular daily activities without incident.  If any biopsies were taken you will be contacted by phone or by letter within the next 1-3 weeks.  Please call us at [phone] if you have not heard about the biopsies in 3 weeks.    SIGNATURES/CONFIDENTIALITY: You and/or your care partner have  signed paperwork which will be entered into your electronic medical record.  These signatures attest to the fact that that the information above on your After Visit Summary has been reviewed and is understood.  Full responsibility of the confidentiality of this discharge information lies with you and/or your care-partner.

## 2015-06-16 ENCOUNTER — Telehealth: Payer: Self-pay | Admitting: *Deleted

## 2015-06-16 NOTE — Telephone Encounter (Signed)
  Follow up Call-  Call back number 06/15/2015  Post procedure Call Back phone  # [phone]  Permission to leave phone message Yes     Patient questions:  Do you have a fever, pain , or abdominal swelling? No. Pain Score  0 *  Have you tolerated food without any problems? Yes.    Have you been able to return to your normal activities? Yes.    Do you have any questions about your discharge instructions: Diet   No. Medications  No. Follow up visit  No.  Do you have questions or concerns about your Care? No.  Actions: * If pain score is 4 or above: No action needed, pain <4.

## 2015-06-21 ENCOUNTER — Other Ambulatory Visit: Payer: Self-pay

## 2015-06-21 DIAGNOSIS — N63 Unspecified lump in unspecified breast: Secondary | ICD-10-CM

## 2015-06-21 DIAGNOSIS — R922 Inconclusive mammogram: Secondary | ICD-10-CM

## 2015-06-21 DIAGNOSIS — Z853 Personal history of malignant neoplasm of breast: Secondary | ICD-10-CM

## 2015-07-03 ENCOUNTER — Other Ambulatory Visit: Payer: Self-pay | Admitting: General Surgery

## 2015-07-17 ENCOUNTER — Other Ambulatory Visit: Payer: Self-pay | Admitting: Internal Medicine

## 2015-08-14 ENCOUNTER — Other Ambulatory Visit: Payer: Self-pay

## 2015-08-14 ENCOUNTER — Other Ambulatory Visit: Payer: Self-pay | Admitting: General Surgery

## 2015-08-14 ENCOUNTER — Other Ambulatory Visit: Payer: Self-pay | Admitting: Hematology and Oncology

## 2015-08-14 DIAGNOSIS — Z853 Personal history of malignant neoplasm of breast: Secondary | ICD-10-CM

## 2015-09-03 ENCOUNTER — Encounter: Payer: Self-pay | Admitting: Internal Medicine

## 2015-09-19 ENCOUNTER — Ambulatory Visit
Admission: RE | Admit: 2015-09-19 | Discharge: 2015-09-19 | Disposition: A | Discharge status: Home / Self Care | Payer: BLUE CROSS/BLUE SHIELD | Source: Ambulatory Visit | Attending: Hematology and Oncology | Admitting: Hematology and Oncology

## 2015-09-19 ENCOUNTER — Other Ambulatory Visit: Payer: Self-pay | Admitting: Internal Medicine

## 2015-09-19 DIAGNOSIS — Z853 Personal history of malignant neoplasm of breast: Secondary | ICD-10-CM

## 2015-09-22 ENCOUNTER — Other Ambulatory Visit (INDEPENDENT_AMBULATORY_CARE_PROVIDER_SITE_OTHER): Payer: BLUE CROSS/BLUE SHIELD

## 2015-09-22 DIAGNOSIS — Z0000 Encounter for general adult medical examination without abnormal findings: Secondary | ICD-10-CM | POA: Diagnosis not present

## 2015-09-22 LAB — POCT URINALYSIS DIPSTICK
BILIRUBIN UA: NEGATIVE
GLUCOSE UA: NEGATIVE
KETONES UA: NEGATIVE
NITRITE UA: NEGATIVE
Protein, UA: NEGATIVE
Spec Grav, UA: 1.025
Urobilinogen, UA: 0.2
pH, UA: 6

## 2015-09-22 LAB — BASIC METABOLIC PANEL
BUN: 15 mg/dL (ref 6–23)
CALCIUM: 9.8 mg/dL (ref 8.4–10.5)
CO2: 32 mEq/L (ref 19–32)
Chloride: 101 mEq/L (ref 96–112)
Creatinine, Ser: 0.99 mg/dL (ref 0.40–1.20)
GFR: 60.29 mL/min (ref >60.00)
GLUCOSE: 114 mg/dL — AB (ref 70–99)
POTASSIUM: 3.7 meq/L (ref 3.5–5.1)
Sodium: 142 mEq/L (ref 135–145)

## 2015-09-22 LAB — CBC WITH DIFFERENTIAL/PLATELET
Basophils Absolute: 0 10*3/uL (ref 0.0–0.1)
Basophils Relative: 0.5 % (ref 0.0–3.0)
EOS PCT: 3.2 % (ref 0.0–5.0)
Eosinophils Absolute: 0.2 10*3/uL (ref 0.0–0.7)
HCT: 43.3 % (ref 36.0–46.0)
Hemoglobin: 14.6 g/dL (ref 12.0–15.0)
LYMPHS ABS: 2 10*3/uL (ref 0.7–4.0)
Lymphocytes Relative: 26.9 % (ref 12.0–46.0)
MCHC: 33.6 g/dL (ref 30.0–36.0)
MCV: 88 fl (ref 78.0–100.0)
MONO ABS: 0.5 10*3/uL (ref 0.1–1.0)
Monocytes Relative: 7 % (ref 3.0–12.0)
NEUTROS ABS: 4.8 10*3/uL (ref 1.4–7.7)
NEUTROS PCT: 62.4 % (ref 43.0–77.0)
PLATELETS: 213 10*3/uL (ref 150.0–400.0)
RBC: 4.93 Mil/uL (ref 3.87–5.11)
RDW: 12.8 % (ref 11.5–15.5)
WBC: 7.6 10*3/uL (ref 4.0–10.5)

## 2015-09-22 LAB — HEPATIC FUNCTION PANEL
ALT: 70 U/L — ABNORMAL HIGH (ref 0–35)
AST: 68 U/L — AB (ref 0–37)
Albumin: 4.1 g/dL (ref 3.5–5.2)
Alkaline Phosphatase: 92 U/L (ref 39–117)
BILIRUBIN TOTAL: 0.8 mg/dL (ref 0.2–1.2)
Bilirubin, Direct: 0.2 mg/dL (ref 0.0–0.3)
Total Protein: 7.3 g/dL (ref 6.0–8.3)

## 2015-09-22 LAB — LIPID PANEL
CHOLESTEROL: 174 mg/dL (ref 0–200)
HDL: 47.6 mg/dL (ref >39.00)
LDL CALC: 104 mg/dL — AB (ref 0–99)
NonHDL: 126.42
TRIGLYCERIDES: 110 mg/dL (ref 0.0–149.0)
Total CHOL/HDL Ratio: 4
VLDL: 22 mg/dL (ref 0.0–40.0)

## 2015-09-22 LAB — TSH: TSH: 2.08 u[IU]/mL (ref 0.35–4.50)

## 2015-09-29 ENCOUNTER — Ambulatory Visit (INDEPENDENT_AMBULATORY_CARE_PROVIDER_SITE_OTHER): Payer: BLUE CROSS/BLUE SHIELD | Admitting: Internal Medicine

## 2015-09-29 ENCOUNTER — Encounter: Payer: Self-pay | Admitting: Internal Medicine

## 2015-09-29 VITALS — BP 120/70 | HR 79 | Temp 98.0°F | Ht 62.21 in | Wt 187.0 lb

## 2015-09-29 DIAGNOSIS — K760 Fatty (change of) liver, not elsewhere classified: Secondary | ICD-10-CM

## 2015-09-29 DIAGNOSIS — Z0000 Encounter for general adult medical examination without abnormal findings: Secondary | ICD-10-CM | POA: Diagnosis not present

## 2015-09-29 DIAGNOSIS — I10 Essential (primary) hypertension: Secondary | ICD-10-CM | POA: Diagnosis not present

## 2015-09-29 DIAGNOSIS — C50512 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: Secondary | ICD-10-CM

## 2015-09-29 DIAGNOSIS — E785 Hyperlipidemia, unspecified: Secondary | ICD-10-CM

## 2015-09-29 NOTE — Progress Notes (Signed)
Subjective:    Patient ID: Katie Becker, female    DOB: Mar 11, 1951, 64 y.o.   MRN: [id number]  HPI  15 -year-old patient who is followed closely by oncology and general surgery for left breast cancer.  She has had a recent mammogram.   she is scheduled for a breast MRI soon  She has hypertension and dyslipidemia. 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   Colonoscopy August 2016  Laboratory studies reviewed.  Fasting blood sugar mildly elevated.  Transaminases elevated.  History of fatty liver disease; hepatitis C antibody has been negative in the past.  Nondrinker   Past Medical History  Diagnosis Date  . DIVERTICULOSIS, COLON 04/20/2007  . GERD 04/20/2007  . HEPATOMEGALY, HX OF 12/21/2009  . HYPERLIPIDEMIA 04/20/2007  . HYPERTENSION 04/20/2007  . LOW BACK PAIN 08/16/2008  . MENOPAUSAL SYNDROME 04/20/2007  . PEPTIC ULCER DISEASE 09/06/2010  . H/O hiatal hernia   . Arthritis     back  . Wears glasses   . Heart murmur     told by PCP- years ago-never had echo  . 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 Marital Status: Widowed    Spouse Name: N/A  . Number of Children: 2  . Years of Education: N/A   Occupational History  .  [employer]  And  [employer]   Social History Main Topics  . Smoking status: Former Smoker    Quit date: 10/21/1978  . Smokeless tobacco: Never Used  . Alcohol Use: No  . Drug Use: No  . Sexual Activity: Not Currently   Other Topics Concern  . Not on file   Social History Narrative    Past Surgical History  Procedure Laterality Date  . Cholecystectomy    . Abdominal hysterectomy    . Tubal ligation    . Carpal tunnel release Bilateral   . Breast lumpectomy with needle localization Left 10/07/2013    Procedure: BREAST LUMPECTOMY WITH NEEDLE LOCALIZATION EXCISION OF BILATERAL AXILLARY SKIN TAGS ;  Surgeon:  Edward Jolly, MD;  Location: [place];  Service: General;  Laterality: Left;  . Re-excision of breast lumpectomy Left 10/25/2013    Procedure: RE-EXCISION OF BREAST LUMPECTOMY;  Surgeon: Edward Jolly, MD;  Location: [place];  Service: General;  Laterality: Left;  clean wound class  . Breast surgery      30 rounds radiation  . Colonoscopy      Family History  Problem Relation Age of Onset  . Hypertension Mother   . Hyperlipidemia Mother   . Heart disease Mother     congestive heart failure  . Cancer Father     hx of melanoma  . Hypertension Sister   . Skin cancer Sister 1    basal cell carcinoma  . Hypertension Brother   . Heart disease Maternal Grandmother 53  . Prostate cancer Cousin   . Breast cancer Maternal Aunt 60  . Colon cancer Paternal Grandmother 31  . Heart disease Maternal Aunt 42    Allergies  Allergen Reactions  . Penicillins Anaphylaxis  . Erythromycin Nausea And Vomiting  . Adhesive [Tape]     rash  . Amoxicillin     REACTION: unspecified  . Shellfish Allergy     welts  . Sulfamethoxazole Hives    REACTION: unspecified  Current Outpatient Prescriptions on File Prior to Visit  Medication Sig Dispense Refill  . anastrozole (ARIMIDEX) 1 MG tablet Take 1 tablet (1 mg total) by mouth daily. 90 tablet 3  . atorvastatin (LIPITOR) 40 MG tablet TAKE 1 TABLET DAILY 90 tablet 1  . Calcium Carbonate-Vitamin D (CALCIUM + D PO) Take 1 tablet by mouth 2 (two) times daily before a meal.     . cephALEXin (KEFLEX) 500 MG capsule TAKE 4 CAPSULES BY MOUTH 1 HR PRIOR TO DENTAL APPT  3  . cetirizine (ZYRTEC) 10 MG tablet Take 10 mg by mouth daily as needed for allergies.    Marland Kitchen esomeprazole (NEXIUM) 40 MG capsule TAKE 1 CAPSULE DAILY 90 capsule 1  . hydrochlorothiazide (HYDRODIURIL) 25 MG tablet TAKE 1 TABLET DAILY 90 tablet 1  . Multiple Vitamins-Minerals (WOMENS MULTIVITAMIN PLUS PO) Take 1 tablet by mouth daily.     . pseudoephedrine  (SUDAFED) 120 MG 12 hr tablet Take 120 mg by mouth 2 (two) times daily as needed.      No current facility-administered medications on file prior to visit.    There were no vitals taken for this visit.      Review of Systems  Constitutional: Negative.   HENT: Negative for congestion, dental problem, hearing loss, rhinorrhea, sinus pressure, sore throat and tinnitus.   Eyes: Negative for pain, discharge and visual disturbance.  Respiratory: Negative for cough and shortness of breath.   Cardiovascular: Negative for chest pain, palpitations and leg swelling.  Gastrointestinal: Negative for nausea, vomiting, abdominal pain, diarrhea, constipation, blood in stool and abdominal distention.  Genitourinary: Negative for dysuria, urgency, frequency, hematuria, flank pain, vaginal bleeding, vaginal discharge, difficulty urinating, vaginal pain and pelvic pain.  Musculoskeletal: Negative for joint swelling, arthralgias and gait problem.  Skin: Positive for wound. Negative for rash.  Neurological: Negative for dizziness, syncope, speech difficulty, weakness, numbness and headaches.  Hematological: Negative for adenopathy.  Psychiatric/Behavioral: Negative for behavioral problems, dysphoric mood and agitation. The patient is not nervous/anxious.        Objective:   Physical Exam  Constitutional: She is oriented to person, place, and time. She appears well-developed and well-nourished.  HENT:  Head: Normocephalic.  Right Ear: External ear normal.  Left Ear: External ear normal.  Mouth/Throat: Oropharynx is clear and moist.  Eyes: Conjunctivae and EOM are normal. Pupils are equal, round, and reactive to light.  Neck: Normal range of motion. Neck supple. No thyromegaly present.  Cardiovascular: Normal rate, regular rhythm, normal heart sounds and intact distal pulses.   Pulmonary/Chest: Effort normal and breath sounds normal.  Abdominal: Soft. Bowel sounds are normal. She exhibits no mass.  There is no tenderness.  Musculoskeletal: Normal range of motion.  Lymphadenopathy:    She has no cervical adenopathy.  Neurological: She is alert and oriented to person, place, and time.  Skin: Skin is warm and dry. No rash noted.  Nonspecific small excoriated papule.  Left facial area  Psychiatric: She has a normal mood and affect. Her behavior is normal.          Assessment & Plan:  Hypertension, well-controlled Dyslipidemia.  Continue statin therapy Preventive health examination  NAFLD- exercise, weight loss encouraged   CPX one year Follow-up oncology and general surgery.  She is scheduled for a breast MRI soongy

## 2015-09-29 NOTE — Patient Instructions (Addendum)
Limit your sodium (Salt) intake    It is important that you exercise regularly, at least 20 minutes 3 to 4 times per week.  If you develop chest pain or shortness of breath seek  medical attention.  You need to lose weight.  Consider a lower calorie diet and regular exercise.  Return in one year for follow-up  Menopause is a normal process in which your reproductive ability comes to an end. This process happens gradually over a span of months to years, usually between the ages of 57 and 38. Menopause is complete when you have missed 12 consecutive menstrual periods. It is important to talk with your health care provider about some of the most common conditions that affect postmenopausal women, such as heart disease, cancer, and bone loss (osteoporosis). Adopting a healthy lifestyle and getting preventive care can help to promote your health and wellness. Those actions can also lower your chances of developing some of these common conditions. WHAT SHOULD I KNOW ABOUT MENOPAUSE? During menopause, you may experience a number of symptoms, such as:  Moderate-to-severe hot flashes.  Night sweats.  Decrease in sex drive.  Mood swings.  Headaches.  Tiredness.  Irritability.  Memory problems.  Insomnia. Choosing to treat or not to treat menopausal changes is an individual decision that you make with your health care provider. WHAT SHOULD I KNOW ABOUT HORMONE REPLACEMENT THERAPY AND SUPPLEMENTS? Hormone therapy products are effective for treating symptoms that are associated with menopause, such as hot flashes and night sweats. Hormone replacement carries certain risks, especially as you become older. If you are thinking about using estrogen or estrogen with progestin treatments, discuss the benefits and risks with your health care provider. WHAT SHOULD I KNOW ABOUT HEART DISEASE AND STROKE? Heart disease, heart attack, and stroke become more likely as you age. This may be due, in part, to  the hormonal changes that your body experiences during menopause. These can affect how your body processes dietary fats, triglycerides, and cholesterol. Heart attack and stroke are both medical emergencies. There are many things that you can do to help prevent heart disease and stroke:  Have your blood pressure checked at least every 1-2 years. High blood pressure causes heart disease and increases the risk of stroke.  If you are 77-24 years old, ask your health care provider if you should take aspirin to prevent a heart attack or a stroke.  Do not use any tobacco products, including cigarettes, chewing tobacco, or electronic cigarettes. If you need help quitting, ask your health care provider.  It is important to eat a healthy diet and maintain a healthy weight.  Be sure to include plenty of vegetables, fruits, low-fat dairy products, and lean protein.  Avoid eating foods that are high in solid fats, added sugars, or salt (sodium).  Get regular exercise. This is one of the most important things that you can do for your health.  Try to exercise for at least 150 minutes each week. The type of exercise that you do should increase your heart rate and make you sweat. This is known as moderate-intensity exercise.  Try to do strengthening exercises at least twice each week. Do these in addition to the moderate-intensity exercise.  Know your numbers.Ask your health care provider to check your cholesterol and your blood glucose. Continue to have your blood tested as directed by your health care provider. WHAT SHOULD I KNOW ABOUT CANCER SCREENING? There are several types of cancer. Take the following steps to reduce  your risk and to catch any cancer development as early as possible. Breast Cancer  Practice breast self-awareness.  This means understanding how your breasts normally appear and feel.  It also means doing regular breast self-exams. Let your health care provider know about any  changes, no matter how small.  If you are 30 or older, have a clinician do a breast exam (clinical breast exam or CBE) every year. Depending on your age, family history, and medical history, it may be recommended that you also have a yearly breast X-ray (mammogram).  If you have a family history of breast cancer, talk with your health care provider about genetic screening.  If you are at high risk for breast cancer, talk with your health care provider about having an MRI and a mammogram every year.  Breast cancer (BRCA) gene test is recommended for women who have family members with BRCA-related cancers. Results of the assessment will determine the need for genetic counseling and BRCA1 and for BRCA2 testing. BRCA-related cancers include these types:  Breast. This occurs in males or females.  Ovarian.  Tubal. This may also be called fallopian tube cancer.  Cancer of the abdominal or pelvic lining (peritoneal cancer).  Prostate.  Pancreatic. Cervical, Uterine, and Ovarian Cancer Your health care provider may recommend that you be screened regularly for cancer of the pelvic organs. These include your ovaries, uterus, and vagina. This screening involves a pelvic exam, which includes checking for microscopic changes to the surface of your cervix (Pap test).  For women ages 21-65, health care providers may recommend a pelvic exam and a Pap test every three years. For women ages 63-65, they may recommend the Pap test and pelvic exam, combined with testing for human papilloma virus (HPV), every five years. Some types of HPV increase your risk of cervical cancer. Testing for HPV may also be done on women of any age who have unclear Pap test results.  Other health care providers may not recommend any screening for nonpregnant women who are considered low risk for pelvic cancer and have no symptoms. Ask your health care provider if a screening pelvic exam is right for you.  If you have had past  treatment for cervical cancer or a condition that could lead to cancer, you need Pap tests and screening for cancer for at least 20 years after your treatment. If Pap tests have been discontinued for you, your risk factors (such as having a new sexual partner) need to be reassessed to determine if you should start having screenings again. Some women have medical problems that increase the chance of getting cervical cancer. In these cases, your health care provider may recommend that you have screening and Pap tests more often.  If you have a family history of uterine cancer or ovarian cancer, talk with your health care provider about genetic screening.  If you have vaginal bleeding after reaching menopause, tell your health care provider.  There are currently no reliable tests available to screen for ovarian cancer. Lung Cancer Lung cancer screening is recommended for adults 63-45 years old who are at high risk for lung cancer because of a history of smoking. A yearly low-dose CT scan of the lungs is recommended if you:  Currently smoke.  Have a history of at least 30 pack-years of smoking and you currently smoke or have quit within the past 15 years. A pack-year is smoking an average of one pack of cigarettes per day for one year. Yearly screening should:  Continue until it has been 15 years since you quit.  Stop if you develop a health problem that would prevent you from having lung cancer treatment. Colorectal Cancer  This type of cancer can be detected and can often be prevented.  Routine colorectal cancer screening usually begins at age 46 and continues through age 49.  If you have risk factors for colon cancer, your health care provider may recommend that you be screened at an earlier age.  If you have a family history of colorectal cancer, talk with your health care provider about genetic screening.  Your health care provider may also recommend using home test kits to check for  hidden blood in your stool.  A small camera at the end of a tube can be used to examine your colon directly (sigmoidoscopy or colonoscopy). This is done to check for the earliest forms of colorectal cancer.  Direct examination of the colon should be repeated every 5-10 years until age 6. However, if early forms of precancerous polyps or small growths are found or if you have a family history or genetic risk for colorectal cancer, you may need to be screened more often. Skin Cancer  Check your skin from head to toe regularly.  Monitor any moles. Be sure to tell your health care provider:  About any new moles or changes in moles, especially if there is a change in a mole's shape or color.  If you have a mole that is larger than the size of a pencil eraser.  If any of your family members has a history of skin cancer, especially at a young age, talk with your health care provider about genetic screening.  Always use sunscreen. Apply sunscreen liberally and repeatedly throughout the day.  Whenever you are outside, protect yourself by wearing long sleeves, pants, a wide-brimmed hat, and sunglasses. WHAT SHOULD I KNOW ABOUT OSTEOPOROSIS? Osteoporosis is a condition in which bone destruction happens more quickly than new bone creation. After menopause, you may be at an increased risk for osteoporosis. To help prevent osteoporosis or the bone fractures that can happen because of osteoporosis, the following is recommended:  If you are 91-8 years old, get at least 1,000 mg of calcium and at least 600 mg of vitamin D per day.  If you are older than age 53 but younger than age 69, get at least 1,200 mg of calcium and at least 600 mg of vitamin D per day.  If you are older than age 79, get at least 1,200 mg of calcium and at least 800 mg of vitamin D per day. Smoking and excessive alcohol intake increase the risk of osteoporosis. Eat foods that are rich in calcium and vitamin D, and do weight-bearing  exercises several times each week as directed by your health care provider. WHAT SHOULD I KNOW ABOUT HOW MENOPAUSE AFFECTS [place]? Depression may occur at any age, but it is more common as you become older. Common symptoms of depression include:  Low or sad mood.  Changes in sleep patterns.  Changes in appetite or eating patterns.  Feeling an overall lack of motivation or enjoyment of activities that you previously enjoyed.  Frequent crying spells. Talk with your health care provider if you think that you are experiencing depression. WHAT SHOULD I KNOW ABOUT IMMUNIZATIONS? It is important that you get and maintain your immunizations. These include:  Tetanus, diphtheria, and pertussis (Tdap) booster vaccine.  Influenza every year before the flu season begins.  Pneumonia  vaccine.  Shingles vaccine. Your health care provider may also recommend other immunizations.   This information is not intended to replace advice given to you by your health care provider. Make sure you discuss any questions you have with your health care provider.   Document Released: 11/29/2005 Document Revised: 10/28/2014 Document Reviewed: 06/09/2014 Elsevier Interactive Patient Education 2016 [employer] Nonalcoholic Fatty Liver Disease Diet Nonalcoholic fatty liver disease is a condition that causes fat to accumulate in and around the liver. The disease makes it harder for the liver to work the way that it should. Following a healthy diet can help to keep nonalcoholic fatty liver disease under control. It can also help to prevent or improve conditions that are associated with the disease, such as heart disease, diabetes, high blood pressure, and abnormal cholesterol levels. Along with regular exercise, this diet:  Promotes weight loss.  Helps to control blood sugar levels.  Helps to improve the way that the body uses insulin. WHAT DO I NEED TO KNOW ABOUT THIS DIET?  Use the glycemic index  (GI) to plan your meals. The index tells you how quickly a food will raise your blood sugar. Choose low-GI foods. These foods take a longer time to raise blood sugar.  Keep track of how many calories you take in. Eating the right amount of calories will help you to achieve a healthy weight.  You may want to follow a Mediterranean diet. This diet includes a lot of vegetables, lean meats or fish, whole grains, fruits, and healthy oils and fats. WHAT FOODS CAN I EAT? Grains Whole grains, such as whole-wheat or whole-grain breads, crackers, tortillas, cereals, and pasta. Stone-ground whole wheat. Pumpernickel bread. Unsweetened oatmeal. Bulgur. Barley. Quinoa. Brown or wild rice. Corn or whole-wheat flour tortillas. Vegetables Lettuce. Spinach. Peas. Beets. Cauliflower. Cabbage. Broccoli. Carrots. Tomatoes. Squash. Eggplant. Herbs. Peppers. Onions. Cucumbers. Brussels sprouts. Yams and sweet potatoes. Beans. Lentils. Fruits Bananas. Apples. Oranges. Grapes. Papaya. Mango. Pomegranate. Kiwi. Grapefruit. Cherries. Meats and Other Protein Sources Seafood and shellfish. Lean meats. Poultry. Tofu. Dairy Low-fat or fat-free dairy products, such as yogurt, cottage cheese, and cheese. Beverages Water. Sugar-free drinks. Tea. Coffee. Low-fat or skim milk. Milk alternatives, such as soy or almond milk. Real fruit juice. Condiments Mustard. Relish. Low-fat, low-sugar ketchup and barbecue sauce. Low-fat or fat-free mayonnaise. Sweets and Desserts Sugar-free sweets. Fats and Oils Avocado. Canola or olive oil. Nuts and nut butters. Seeds. The items listed above may not be a complete list of recommended foods or beverages. Contact your dietitian for more options.  WHAT FOODS ARE NOT RECOMMENDED? Palm oil and coconut oil. Processed foods. Fried foods. Sweetened drinks, such as sweet tea, milkshakes, snow cones, iced sweet drinks, and sodas. Alcohol. Sweets. Foods that contain a lot of salt or sodium. The  items listed above may not be a complete list of foods and beverages to avoid. Contact your dietitian for more information.   This information is not intended to replace advice given to you by your health care provider. Make sure you discuss any questions you have with your health care provider.   Document Released: 02/21/2015 Document Reviewed: 02/21/2015 Elsevier Interactive Patient Education [employer].

## 2015-09-29 NOTE — Progress Notes (Signed)
Pre visit review using our clinic review tool, if applicable. No additional management support is needed unless otherwise documented below in the visit note. 

## 2015-10-03 NOTE — Assessment & Plan Note (Signed)
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 Breast exam 10/04/15: Normal 2. Mammograms 09/19/15: Normal. 2. Bone density 04/04/2015: Normal bone density  Patient and her sister take care of her ailing mother age 62 and requires 24/7 home care. This does not give her much time to do exercise. She will try to make some more time to get some activity.

## 2015-10-04 ENCOUNTER — Ambulatory Visit (HOSPITAL_BASED_OUTPATIENT_CLINIC_OR_DEPARTMENT_OTHER): Payer: BLUE CROSS/BLUE SHIELD | Admitting: Hematology and Oncology

## 2015-10-04 ENCOUNTER — Encounter: Payer: Self-pay | Admitting: Hematology and Oncology

## 2015-10-04 ENCOUNTER — Telehealth: Payer: Self-pay | Admitting: Hematology and Oncology

## 2015-10-04 VITALS — BP 134/64 | HR 79 | Temp 98.1°F | Resp 18 | Ht 62.2 in | Wt 184.7 lb

## 2015-10-04 DIAGNOSIS — Z170 Estrogen receptor positive status [ER+]: Secondary | ICD-10-CM | POA: Diagnosis not present

## 2015-10-04 DIAGNOSIS — Z79811 Long term (current) use of aromatase inhibitors: Secondary | ICD-10-CM | POA: Diagnosis not present

## 2015-10-04 DIAGNOSIS — C50512 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: Secondary | ICD-10-CM

## 2015-10-04 MED ORDER — LETROZOLE 2.5 MG PO TABS: 2.5000 mg | ORAL_TABLET | Freq: Every day | ORAL | Status: DC

## 2015-10-04 NOTE — Progress Notes (Signed)
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 [facility])   Staging form: Breast, AJCC 7th Edition     Clinical: Stage 0 (Tis (DCIS), N0, cM0) - Unsigned       Staging comments: Staged at breast conference 11.12.14      Pathologic: No stage assigned - Unsigned   SUMMARY OF ONCOLOGIC HISTORY:   Breast cancer of lower-outer quadrant of left female breast ([place])   10/07/2013 Surgery Left breast lumpectomy: DCIS ER positive PR positive, reexcision of positive margins on 10/25/2013, final margins negative   11/08/2013 - 12/27/2013 Radiation Therapy Adjuvant radiation therapy   01/03/2014 Procedure Genetic testing negative for breast and ovarian cancer panels   01/24/2014 - 10/16/2014 Anti-estrogen oral therapy Tamoxifen 20 mg daily 5 years   09/13/2014 Relapse/Recurrence MRI of the breast revealed a 3.5 cm area of non-mass enhancement left breast lateral 3.5 x 1.9 x 1.6 cm, oval circumscribed T2 bright enhancing mass right breast upper outer quadrant 1 x 0.7 cm, linear non-mass enhancement 11 x 6 x 3 mm   10/07/2014 Surgery Left breast lumpectomy: DCIS grade 10.6 cm ER 100%, PR 0%, multiple skin tags benign fibroepithelial polyps, reexcision 10/25/2013 benign fibrocystic changes with calcifications   12/27/2014 -  Anti-estrogen oral therapy Anastrozole 1 mg daily changed to letrozole 2.5 mg daily 01/05/2017due to musculoskeletal aches and pains    CHIEF COMPLIANT: follow-up on anastrozole  INTERVAL HISTORY: Katie Becker is a 64 year old with above-mentioned history of recurrent left breast cancer. She is currently on anastrozole adjuvant therapy. She complains of diffuse musculoskeletal aches and pains that are quite debilitating. This has been limiting her from doing a lot of physical activity or exercise. She is also worried about gaining weight and fatty liver. She has chronic musculoskeletal pains as well. Denies any  lumps or nodules in the breasts.  REVIEW OF SYSTEMS:   Constitutional: Denies fevers, chills or abnormal weight loss Eyes: Denies blurriness of vision Ears, nose, mouth, throat, and face: Denies mucositis or sore throat Respiratory: Denies cough, dyspnea or wheezes Cardiovascular: Denies palpitation, chest discomfort or lower extremity swelling Gastrointestinal:  Denies nausea, heartburn or change in bowel habits Skin: Denies abnormal skin rashes Lymphatics: Denies new lymphadenopathy or easy bruising Neurological:diffuse musculoskeletal aches and pains Behavioral/Psych: Mood is stable, no new changes  Breast:  denies any pain or lumps or nodules in either breasts All other systems were reviewed with the patient and are negative.  I have reviewed the past medical history, past surgical history, social history and family history with the patient and they are unchanged from previous note.  ALLERGIES:  is allergic to penicillins; erythromycin; adhesive; amoxicillin; shellfish allergy; and sulfamethoxazole.  MEDICATIONS:  Current Outpatient Prescriptions  Medication Sig Dispense Refill  . atorvastatin (LIPITOR) 40 MG tablet TAKE 1 TABLET DAILY 90 tablet 1  . Calcium Carbonate-Vitamin D (CALCIUM + D PO) Take 1 tablet by mouth 2 (two) times daily before a meal.     . cephALEXin (KEFLEX) 500 MG capsule TAKE 4 CAPSULES BY MOUTH 1 HR PRIOR TO DENTAL APPT  3  . cetirizine (ZYRTEC) 10 MG tablet Take 10 mg by mouth daily as needed for allergies.    Marland Kitchen esomeprazole (NEXIUM) 40 MG capsule TAKE 1 CAPSULE DAILY 90 capsule 1  . hydrochlorothiazide (HYDRODIURIL) 25 MG tablet TAKE 1 TABLET DAILY 90 tablet 1  . letrozole (FEMARA) 2.5 MG tablet Take 1 tablet (2.5  mg total) by mouth daily. 30 tablet 0  . Multiple Vitamins-Minerals (WOMENS MULTIVITAMIN PLUS PO) Take 1 tablet by mouth daily.     . pseudoephedrine (SUDAFED) 120 MG 12 hr tablet Take 120 mg by mouth 2 (two) times daily as needed.      No  current facility-administered medications for this visit.    PHYSICAL EXAMINATION: ECOG PERFORMANCE STATUS: 1 - Symptomatic but completely ambulatory  Filed Vitals:   10/04/15 1031 10/04/15 1035  BP: 134/64 134/64  Pulse: 79 79  Temp: 98.1 F (36.7 C) 98.1 F (36.7 C)  Resp: 18 18   Filed Weights   10/04/15 1031 10/04/15 1035  Weight: 184 lb 11.2 oz (83.779 kg) 184 lb 11.2 oz (83.779 kg)    GENERAL:alert, no distress and comfortable SKIN: skin color, texture, turgor are normal, no rashes or significant lesions EYES: normal, Conjunctiva are pink and non-injected, sclera clear OROPHARYNX:no exudate, no erythema and lips, buccal mucosa, and tongue normal  NECK: supple, thyroid normal size, non-tender, without nodularity LYMPH:  no palpable lymphadenopathy in the cervical, axillary or inguinal LUNGS: clear to auscultation and percussion with normal breathing effort HEART: regular rate & rhythm and no murmurs and no lower extremity edema ABDOMEN:abdomen soft, non-tender and normal bowel sounds Musculoskeletal:no cyanosis of digits and no clubbing  NEURO: alert & oriented x 3 with fluent speech, diffuse musculoskeletal aches and pains  LABORATORY DATA:  I have reviewed the data as listed   Chemistry      Component Value Date/Time   NA 142 09/22/2015 0755   NA 143 07/14/2014 1104   K 3.7 09/22/2015 0755   K 3.4* 07/14/2014 1104   CL 101 09/22/2015 0755   CO2 32 09/22/2015 0755   CO2 28 07/14/2014 1104   BUN 15 09/22/2015 0755   BUN 14.1 07/14/2014 1104   CREATININE 0.99 09/22/2015 0755   CREATININE 1.1 07/14/2014 1104      Component Value Date/Time   CALCIUM 9.8 09/22/2015 0755   CALCIUM 9.6 07/14/2014 1104   ALKPHOS 92 09/22/2015 0755   ALKPHOS 87 07/14/2014 1104   AST 68* 09/22/2015 0755   AST 77* 07/14/2014 1104   ALT 70* 09/22/2015 0755   ALT 65* 07/14/2014 1104   BILITOT 0.8 09/22/2015 0755   BILITOT 0.66 07/14/2014 1104       Lab Results  Component  Value Date   WBC 7.6 09/22/2015   HGB 14.6 09/22/2015   HCT 43.3 09/22/2015   MCV 88.0 09/22/2015   PLT 213.0 09/22/2015   NEUTROABS 4.8 09/22/2015   ASSESSMENT & PLAN:  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 Musculoskeletal aches and pains These muscle or skeletal aches and pains have gotten so severe that she is unable to function normally. She is unable to exercise. Because of this she has been gaining weight. Her primary care physician is also concerned about fatty liver. Her daughter has plans for her to exercise regularly. They have a treadmill and an elliptical as well as a bicycle. We decided to discontinue anastrozole and switch her to letrozole.She will stop anastrozole right now and will resume letrozole in the new year.  Return to clinic in 2 months to evaluate toxicity to letrozole.   Breast  Cancer Surveillance:  1. Breast exam 10/04/15: Normal 2. Mammograms 09/19/15: Normal. 2. Bone density 04/04/2015: Normal bone density  Patient and her sister take care of her ailing mother age 69 and requires 24/7 home care. This does not give her much time to do exercise. She will try to make some more time to get some activity.   No orders of the defined types were placed in this encounter.   The patient has a good understanding of the overall plan. she agrees with it. she will call with any problems that may develop before the next visit here.   Rulon Eisenmenger, MD 10/04/2015

## 2015-10-04 NOTE — Telephone Encounter (Signed)
Gave patient avs report and appointments February.

## 2015-10-06 ENCOUNTER — Ambulatory Visit
Admission: RE | Admit: 2015-10-06 | Discharge: 2015-10-06 | Disposition: A | Discharge status: Home / Self Care | Payer: BLUE CROSS/BLUE SHIELD | Source: Ambulatory Visit | Attending: General Surgery | Admitting: General Surgery

## 2015-10-06 MED ORDER — GADOBENATE DIMEGLUMINE 529 MG/ML IV SOLN
17.0000 mL | Freq: Once | INTRAVENOUS | Status: AC | PRN
  Administered 2015-10-06: 17 mL via INTRAVENOUS

## 2015-10-11 ENCOUNTER — Encounter: Payer: Self-pay | Admitting: Hematology and Oncology

## 2015-10-12 ENCOUNTER — Other Ambulatory Visit: Payer: Self-pay | Admitting: *Deleted

## 2015-10-12 DIAGNOSIS — C50512 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: Secondary | ICD-10-CM

## 2015-10-12 MED ORDER — LETROZOLE 2.5 MG PO TABS: 2.5000 mg | ORAL_TABLET | Freq: Every day | ORAL | Status: DC

## 2015-10-23 ENCOUNTER — Encounter: Payer: Self-pay | Admitting: Internal Medicine

## 2015-10-26 ENCOUNTER — Other Ambulatory Visit: Payer: BLUE CROSS/BLUE SHIELD

## 2015-10-27 ENCOUNTER — Other Ambulatory Visit: Payer: Self-pay | Admitting: General Surgery

## 2015-10-27 ENCOUNTER — Ambulatory Visit
Admission: RE | Admit: 2015-10-27 | Discharge: 2015-10-27 | Disposition: A | Discharge status: Home / Self Care | Payer: BLUE CROSS/BLUE SHIELD | Source: Ambulatory Visit | Attending: General Surgery | Admitting: General Surgery

## 2015-10-27 ENCOUNTER — Ambulatory Visit: Admission: RE | Admit: 2015-10-27 | Payer: BLUE CROSS/BLUE SHIELD | Source: Ambulatory Visit

## 2015-10-27 DIAGNOSIS — N63 Unspecified lump in unspecified breast: Secondary | ICD-10-CM

## 2015-11-10 ENCOUNTER — Encounter: Payer: Self-pay | Admitting: Internal Medicine

## 2015-11-28 NOTE — Assessment & Plan Note (Signed)
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 Breast exam 10/04/15: Normal 2. Mammograms 09/19/15: Normal. 2. Bone density 04/04/2015: Normal bone density  Patient and her sister take care of her ailing mother age 54 and requires 24/7 home care. This does not give her much time to do exercise. She will try to make some more time to get some activity.

## 2015-11-29 ENCOUNTER — Telehealth: Payer: Self-pay | Admitting: Hematology and Oncology

## 2015-11-29 ENCOUNTER — Encounter: Payer: Self-pay | Admitting: Hematology and Oncology

## 2015-11-29 ENCOUNTER — Ambulatory Visit (HOSPITAL_BASED_OUTPATIENT_CLINIC_OR_DEPARTMENT_OTHER): Payer: BLUE CROSS/BLUE SHIELD | Admitting: Hematology and Oncology

## 2015-11-29 VITALS — BP 133/55 | HR 74 | Temp 97.6°F | Resp 18 | Ht 62.2 in | Wt 182.5 lb

## 2015-11-29 DIAGNOSIS — Z170 Estrogen receptor positive status [ER+]: Secondary | ICD-10-CM

## 2015-11-29 DIAGNOSIS — D0512 Intraductal carcinoma in situ of left breast: Secondary | ICD-10-CM

## 2015-11-29 DIAGNOSIS — C50512 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: Secondary | ICD-10-CM

## 2015-11-29 NOTE — Telephone Encounter (Signed)
Appointments made and avs printed °

## 2015-11-29 NOTE — Progress Notes (Signed)
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 [facility])   Staging form: Breast, AJCC 7th Edition     Clinical: Stage 0 (Tis (DCIS), N0, cM0) - Unsigned       Staging comments: Staged at breast conference 11.12.14      Pathologic: No stage assigned - Unsigned   SUMMARY OF ONCOLOGIC HISTORY:   Breast cancer of lower-outer quadrant of left female breast ([place])   10/07/2013 Surgery Left breast lumpectomy: DCIS ER positive PR positive, reexcision of positive margins on 10/25/2013, final margins negative   11/08/2013 - 12/27/2013 Radiation Therapy Adjuvant radiation therapy   01/03/2014 Procedure Genetic testing negative for breast and ovarian cancer panels   01/24/2014 - 10/16/2014 Anti-estrogen oral therapy Tamoxifen 20 mg daily 5 years   09/13/2014 Relapse/Recurrence MRI of the breast revealed a 3.5 cm area of non-mass enhancement left breast lateral 3.5 x 1.9 x 1.6 cm, oval circumscribed T2 bright enhancing mass right breast upper outer quadrant 1 x 0.7 cm, linear non-mass enhancement 11 x 6 x 3 mm   10/07/2014 Surgery Left breast lumpectomy: DCIS grade 10.6 cm ER 100%, PR 0%, multiple skin tags benign fibroepithelial polyps, reexcision 10/25/2013 benign fibrocystic changes with calcifications   12/27/2014 -  Anti-estrogen oral therapy Anastrozole 1 mg daily changed to letrozole 2.5 mg daily 01/05/2017due to musculoskeletal aches and pains    CHIEF COMPLIANT: Follow-up one letrozole  INTERVAL HISTORY: Katie Becker is a 65 year old with above-mentioned history of intolerance to anastrozole therapy due to severe muscular skeletal aches and pains was prescribed letrozole and she's been on it for the past month. She reports that she is tolerating it significantly better. She does not have the muscle aches and swelling. She is requiring orthotics up support for the ankles and plantar arch. She still doesn't have time to  do much exercise because of duties that she performs in helping take care of her mother.  REVIEW OF SYSTEMS:   Constitutional: Denies fevers, chills or abnormal weight loss Eyes: Denies blurriness of vision Ears, nose, mouth, throat, and face: Denies mucositis or sore throat Respiratory: Denies cough, dyspnea or wheezes Cardiovascular: Denies palpitation, chest discomfort Gastrointestinal:  Denies nausea, heartburn or change in bowel habits Skin: Denies abnormal skin rashes Lymphatics: Denies new lymphadenopathy or easy bruising Neurological:Denies numbness, tingling or new weaknesses Behavioral/Psych: Mood is stable, no new changes  Extremities: No lower extremity edema Breast:  denies any pain or lumps or nodules in either breasts All other systems were reviewed with the patient and are negative.  I have reviewed the past medical history, past surgical history, social history and family history with the patient and they are unchanged from previous note.  ALLERGIES:  is allergic to penicillins; erythromycin; adhesive; amoxicillin; shellfish allergy; and sulfamethoxazole.  MEDICATIONS:  Current Outpatient Prescriptions  Medication Sig Dispense Refill  . atorvastatin (LIPITOR) 40 MG tablet TAKE 1 TABLET DAILY 90 tablet 1  . Calcium Carbonate-Vitamin D (CALCIUM + D PO) Take 1 tablet by mouth 2 (two) times daily before a meal.     . cephALEXin (KEFLEX) 500 MG capsule TAKE 4 CAPSULES BY MOUTH 1 HR PRIOR TO DENTAL APPT  3  . cetirizine (ZYRTEC) 10 MG tablet Take 10 mg by mouth daily as needed for allergies.    Marland Kitchen esomeprazole (NEXIUM) 40 MG capsule TAKE 1 CAPSULE DAILY 90 capsule 1  . hydrochlorothiazide (HYDRODIURIL) 25 MG tablet TAKE 1  TABLET DAILY 90 tablet 1  . letrozole (FEMARA) 2.5 MG tablet Take 1 tablet (2.5 mg total) by mouth daily. 30 tablet 2  . Multiple Vitamins-Minerals (WOMENS MULTIVITAMIN PLUS PO) Take 1 tablet by mouth daily.     . pseudoephedrine (SUDAFED) 120 MG 12 hr  tablet Take 120 mg by mouth 2 (two) times daily as needed.      No current facility-administered medications for this visit.    PHYSICAL EXAMINATION: ECOG PERFORMANCE STATUS: 1 - Symptomatic but completely ambulatory  Filed Vitals:   11/29/15 0959  BP: 133/55  Pulse: 74  Temp: 97.6 F (36.4 C)  Resp: 18   Filed Weights   11/29/15 0959  Weight: 182 lb 8 oz (82.781 kg)    GENERAL:alert, no distress and comfortable SKIN: skin color, texture, turgor are normal, no rashes or significant lesions EYES: normal, Conjunctiva are pink and non-injected, sclera clear OROPHARYNX:no exudate, no erythema and lips, buccal mucosa, and tongue normal  NECK: supple, thyroid normal size, non-tender, without nodularity LYMPH:  no palpable lymphadenopathy in the cervical, axillary or inguinal LUNGS: clear to auscultation and percussion with normal breathing effort HEART: regular rate & rhythm and no murmurs and no lower extremity edema ABDOMEN:abdomen soft, non-tender and normal bowel sounds MUSCULOSKELETAL:no cyanosis of digits and no clubbing  NEURO: alert & oriented x 3 with fluent speech, no focal motor/sensory deficits EXTREMITIES: No lower extremity edema  LABORATORY DATA:  I have reviewed the data as listed   Chemistry      Component Value Date/Time   NA 142 09/22/2015 0755   NA 143 07/14/2014 1104   K 3.7 09/22/2015 0755   K 3.4* 07/14/2014 1104   CL 101 09/22/2015 0755   CO2 32 09/22/2015 0755   CO2 28 07/14/2014 1104   BUN 15 09/22/2015 0755   BUN 14.1 07/14/2014 1104   CREATININE 0.99 09/22/2015 0755   CREATININE 1.1 07/14/2014 1104      Component Value Date/Time   CALCIUM 9.8 09/22/2015 0755   CALCIUM 9.6 07/14/2014 1104   ALKPHOS 92 09/22/2015 0755   ALKPHOS 87 07/14/2014 1104   AST 68* 09/22/2015 0755   AST 77* 07/14/2014 1104   ALT 70* 09/22/2015 0755   ALT 65* 07/14/2014 1104   BILITOT 0.8 09/22/2015 0755   BILITOT 0.66 07/14/2014 1104       Lab Results    Component Value Date   WBC 7.6 09/22/2015   HGB 14.6 09/22/2015   HCT 43.3 09/22/2015   MCV 88.0 09/22/2015   PLT 213.0 09/22/2015   NEUTROABS 4.8 09/22/2015     ASSESSMENT & PLAN:  Breast cancer of lower-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 Breast exam 10/04/15: Normal 2. Mammograms 09/19/15: Normal. 2. Bone density 04/04/2015: Normal bone density  Patient and her sister take care of her ailing mother age 34 and requires 24/7 home care. This does not give her much time to do exercise. She will try to make some more time to get some activity. Provided info on healthy eating and wellness class.  No orders of the defined types were placed in this encounter.  The patient has a good understanding of the overall plan. she agrees with it. she will call with any problems that may develop before the next visit here.   Rulon Eisenmenger, MD 11/29/2015

## 2015-12-07 ENCOUNTER — Encounter: Payer: Self-pay | Admitting: Hematology and Oncology

## 2015-12-13 NOTE — Telephone Encounter (Signed)
Pt called to ask again about a 90 day supply letroz[facility], rather than the 1 month that has been done so far.

## 2015-12-14 ENCOUNTER — Other Ambulatory Visit: Payer: Self-pay

## 2015-12-14 DIAGNOSIS — C50512 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: Secondary | ICD-10-CM

## 2015-12-14 MED ORDER — LETROZOLE 2.5 MG PO TABS: 2.5000 mg | ORAL_TABLET | Freq: Every day | ORAL | Status: DC

## 2015-12-14 NOTE — Telephone Encounter (Signed)
Let pt know letrozole Rx for 90 day supply with 2 refills had been sent to [facility].  Pt voiced understanding.

## 2015-12-16 ENCOUNTER — Other Ambulatory Visit: Payer: Self-pay | Admitting: Internal Medicine

## 2016-03-15 ENCOUNTER — Other Ambulatory Visit: Payer: Self-pay | Admitting: Internal Medicine

## 2016-03-20 ENCOUNTER — Other Ambulatory Visit: Payer: Self-pay | Admitting: General Surgery

## 2016-03-20 DIAGNOSIS — N63 Unspecified lump in unspecified breast: Secondary | ICD-10-CM

## 2016-03-27 ENCOUNTER — Ambulatory Visit
Admission: RE | Admit: 2016-03-27 | Discharge: 2016-03-27 | Disposition: A | Discharge status: Home / Self Care | Payer: BLUE CROSS/BLUE SHIELD | Source: Ambulatory Visit | Attending: General Surgery | Admitting: General Surgery

## 2016-03-27 DIAGNOSIS — N63 Unspecified lump in unspecified breast: Secondary | ICD-10-CM

## 2016-05-13 ENCOUNTER — Telehealth: Payer: Self-pay | Admitting: Internal Medicine

## 2016-05-13 ENCOUNTER — Encounter: Payer: Self-pay | Admitting: Family Medicine

## 2016-05-13 ENCOUNTER — Ambulatory Visit (INDEPENDENT_AMBULATORY_CARE_PROVIDER_SITE_OTHER): Payer: BLUE CROSS/BLUE SHIELD | Admitting: Family Medicine

## 2016-05-13 VITALS — BP 128/82 | HR 85 | Temp 98.0°F | Ht 62.2 in | Wt 174.1 lb

## 2016-05-13 DIAGNOSIS — N3090 Cystitis, unspecified without hematuria: Secondary | ICD-10-CM

## 2016-05-13 DIAGNOSIS — R300 Dysuria: Secondary | ICD-10-CM | POA: Diagnosis not present

## 2016-05-13 LAB — POCT URINALYSIS DIPSTICK
Bilirubin, UA: NEGATIVE
Glucose, UA: NEGATIVE
KETONES UA: NEGATIVE
Nitrite, UA: POSITIVE
PH UA: 5.5
Spec Grav, UA: <=1.005
Urobilinogen, UA: 2

## 2016-05-13 MED ORDER — PHENAZOPYRIDINE HCL 100 MG PO TABS
100.0000 mg | ORAL_TABLET | Freq: Three times a day (TID) | ORAL | 0 refills | Status: DC | PRN
Start: 2016-05-13 — End: 2016-05-28

## 2016-05-13 MED ORDER — NITROFURANTOIN MONOHYD MACRO 100 MG PO CAPS: 100.0000 mg | ORAL_CAPSULE | Freq: Two times a day (BID) | ORAL | Status: DC

## 2016-05-13 NOTE — Telephone Encounter (Signed)
Pt needs abx call into [facility] on [address]. Pt saw julie today

## 2016-05-13 NOTE — Patient Instructions (Signed)
Please complete antibiotic as directed. If you develop symptoms of fever >101, pain in your back, or do not improve with treatment that has been provided in 3 to 4 days, please follow up for further evaluation and treatment.   Urinary Tract Infection Urinary tract infections (UTIs) can develop anywhere along your urinary tract. Your urinary tract is your body's drainage system for removing wastes and extra water. Your urinary tract includes two kidneys, two ureters, a bladder, and a urethra. Your kidneys are a pair of bean-shaped organs. Each kidney is about the size of your fist. They are located below your ribs, one on each side of your spine. CAUSES Infections are caused by microbes, which are microscopic organisms, including fungi, viruses, and bacteria. These organisms are so small that they can only be seen through a microscope. Bacteria are the microbes that most commonly cause UTIs. SYMPTOMS  Symptoms of UTIs may vary by age and gender of the patient and by the location of the infection. Symptoms in young women typically include a frequent and intense urge to urinate and a painful, burning feeling in the bladder or urethra during urination. Older women and men are more likely to be tired, shaky, and weak and have muscle aches and abdominal pain. A fever may mean the infection is in your kidneys. Other symptoms of a kidney infection include pain in your back or sides below the ribs, nausea, and vomiting. DIAGNOSIS To diagnose a UTI, your caregiver will ask you about your symptoms. Your caregiver will also ask you to provide a urine sample. The urine sample will be tested for bacteria and white blood cells. White blood cells are made by your body to help fight infection. TREATMENT  Typically, UTIs can be treated with medication. Because most UTIs are caused by a bacterial infection, they usually can be treated with the use of antibiotics. The choice of antibiotic and length of treatment depend on  your symptoms and the type of bacteria causing your infection. HOME CARE INSTRUCTIONS  If you were prescribed antibiotics, take them exactly as your caregiver instructs you. Finish the medication even if you feel better after you have only taken some of the medication.  Drink enough water and fluids to keep your urine clear or pale yellow.  Avoid caffeine, tea, and carbonated beverages. They tend to irritate your bladder.  Empty your bladder often. Avoid holding urine for long periods of time.  Empty your bladder before and after sexual intercourse.  After a bowel movement, women should cleanse from front to back. Use each tissue only once. SEEK MEDICAL CARE IF:   You have back pain.  You develop a fever.  Your symptoms do not begin to resolve within 3 days. SEEK IMMEDIATE MEDICAL CARE IF:   You have severe back pain or lower abdominal pain.  You develop chills.  You have nausea or vomiting.  You have continued burning or discomfort with urination. MAKE SURE YOU:   Understand these instructions.  Will watch your condition.  Will get help right away if you are not doing well or get worse.   This information is not intended to replace advice given to you by your health care provider. Make sure you discuss any questions you have with your health care provider.   Document Released: 07/17/2005 Document Revised: 06/28/2015 Document Reviewed: 11/15/2011 Elsevier Interactive Patient Education [employer].

## 2016-05-13 NOTE — Telephone Encounter (Signed)
Spoke with pt and informed her that the prescription was called in. Pt verbalized understanding.

## 2016-05-13 NOTE — Progress Notes (Addendum)
Subjective:    Patient ID: Katie Becker, female    DOB: 30-Jan-1951, 65 y.o.   MRN: [id number]  HPI  Katie Becker is a 65 year old female who presents today with urinary burning for 4 days. Associated symptom of cloudy urine, urinary frequency, suprapubic pain, and urinary urgency. She denies fever, chills, sweats, flank pain, N/V/D.  History of imiquimod use due to "wart treatment on vulva" which has caused some of these same symptoms in the past per patient report. She reports "vaginal lining thinning" and she is followed by gynecology and a skin specialist who are currently treating her with imiquimod. She has a follow up appointment tomorrow regarding her imiquimod therapy. History of Breast Cancer and she is followed by her oncologist twice yearly. She denies a history of recurrent UTIs or recent antibiotic use. She has not tried any treatments at home for urinary symptoms at this time.  Review of Systems  Constitutional: Negative for chills, fatigue and fever.  Eyes: Negative for visual disturbance.  Respiratory: Negative for cough and shortness of breath.   Cardiovascular: Negative for chest pain and palpitations.  Gastrointestinal: Negative for abdominal pain, diarrhea, nausea and vomiting.  Genitourinary: Positive for dysuria, frequency and urgency. Negative for flank pain.  Skin: Negative for rash.   Past Medical History:  Diagnosis Date  . Arthritis    back  . Breast cancer ([place])    Left Breast - ductal Carcinoma In-Situ - diagnosed on MRI- biopsy  . DIVERTICULOSIS, COLON 04/20/2007  . GERD 04/20/2007  . H/O hiatal hernia   . Heart murmur    told by PCP- years ago-never had echo  . HEPATOMEGALY, HX OF 12/21/2009  . HYPERLIPIDEMIA 04/20/2007  . HYPERTENSION 04/20/2007  . LOW BACK PAIN 08/16/2008  . MENOPAUSAL SYNDROME 04/20/2007  . PEPTIC ULCER DISEASE 09/06/2010  . Wears glasses      Social History   Social History  . Marital status: Widowed    Spouse name: N/A  . Number  of children: 2  . Years of education: N/A   Occupational History  .  [employer]-Cust [employer]  And  [employer]   Social History Main Topics  . Smoking status: Former Smoker    Quit date: 10/21/1978  . Smokeless tobacco: Never Used  . Alcohol use No  . Drug use: No  . Sexual activity: Not Currently   Other Topics Concern  . Not on file   Social History Narrative  . No narrative on file    Past Surgical History:  Procedure Laterality Date  . ABDOMINAL HYSTERECTOMY    . BREAST LUMPECTOMY WITH NEEDLE LOCALIZATION Left 10/07/2013   Procedure: BREAST LUMPECTOMY WITH NEEDLE LOCALIZATION EXCISION OF BILATERAL AXILLARY SKIN TAGS ;  Surgeon: Edward Jolly, MD;  Location: [facility];  Service: General;  Laterality: Left;  . BREAST SURGERY     30 rounds radiation  . CARPAL TUNNEL RELEASE Bilateral   . CHOLECYSTECTOMY    . COLONOSCOPY    . RE-EXCISION OF BREAST LUMPECTOMY Left 10/25/2013   Procedure: RE-EXCISION OF BREAST LUMPECTOMY;  Surgeon: Edward Jolly, MD;  Location: [facility];  Service: General;  Laterality: Left;  clean wound class  . TUBAL LIGATION      Family History  Problem Relation Age of Onset  . Hypertension Mother   . Hyperlipidemia Mother   . Heart disease Mother     congestive heart failure  . Cancer Father     hx of  melanoma  . Hypertension Sister   . Skin cancer Sister 73    basal cell carcinoma  . Hypertension Brother   . Heart disease Maternal Grandmother 52  . Prostate cancer Cousin   . Breast cancer Maternal Aunt 60  . Colon cancer Paternal Grandmother 30  . Heart disease Maternal Aunt 42    Allergies  Allergen Reactions  . Penicillins Anaphylaxis  . Erythromycin Nausea And Vomiting  . Adhesive [Tape]     rash  . Amoxicillin     REACTION: unspecified  . Shellfish Allergy     welts  . Sulfamethoxazole Hives    REACTION: unspecified    Current Outpatient Prescriptions on File Prior to Visit  Medication Sig Dispense Refill  .  atorvastatin (LIPITOR) 40 MG tablet TAKE 1 TABLET DAILY 90 tablet 1  . Calcium Carbonate-Vitamin D (CALCIUM + D PO) Take 1 tablet by mouth 2 (two) times daily before a meal.     . cephALEXin (KEFLEX) 500 MG capsule TAKE 4 CAPSULES BY MOUTH 1 HR PRIOR TO DENTAL APPT  3  . cetirizine (ZYRTEC) 10 MG tablet Take 10 mg by mouth daily as needed for allergies.    Marland Kitchen esomeprazole (NEXIUM) 40 MG capsule TAKE 1 CAPSULE DAILY 90 capsule 3  . hydrochlorothiazide (HYDRODIURIL) 25 MG tablet TAKE 1 TABLET DAILY 90 tablet 3  . letrozole (FEMARA) 2.5 MG tablet Take 1 tablet (2.5 mg total) by mouth daily. 90 tablet 2  . Multiple Vitamins-Minerals (WOMENS MULTIVITAMIN PLUS PO) Take 1 tablet by mouth daily.     . pseudoephedrine (SUDAFED) 120 MG 12 hr tablet Take 120 mg by mouth 2 (two) times daily as needed.      No current facility-administered medications on file prior to visit.     BP 128/82 (BP Location: Left Arm, Patient Position: Sitting, Cuff Size: Normal)   Pulse 85   Temp 98 F (36.7 C) (Oral)   Ht 5' 2.2" (1.58 m)   Wt 174 lb 1.6 oz (79 kg)   SpO2 97%   BMI 31.64 kg/m       Objective:   Physical Exam  Constitutional: She is oriented to person, place, and time. She appears well-developed and well-nourished.  Eyes: Pupils are equal, round, and reactive to light. No scleral icterus.  Hordeolum noted on left upper lid. She has been evaluated for this and is being followed by her eye care specialist  Cardiovascular: Normal rate, regular rhythm and intact distal pulses.   Pulmonary/Chest: Effort normal and breath sounds normal. She has no wheezes. She has no rales.  Abdominal: Soft. Bowel sounds are normal. There is no CVA tenderness.  Mild suprapubic tenderness present  Neurological: She is alert and oriented to person, place, and time.  Skin: Skin is warm and dry.  Psychiatric: She has a normal mood and affect. Her behavior is normal. Judgment and thought content normal.       Assessment &  Plan:  1. Cystitis UA 3+ Leukocytes, 2+ blood, and +nitrites. Exam and history support treatment for cystitis as suspicion of pyelonephritis is low and unlikely present today.  Recommend that patient keep her appointment with her gynecologist and skin care specialist as scheduled.  Advised her to follow up if symptoms do not improve in 2 to 3 days, worsen, or she develop a fever >101, or back pain.  - nitrofurantoin, macrocrystal-monohydrate, (MACROBID) 100 MG capsule; Take 1 capsule (100 mg total) by mouth 2 (two) times daily.  Dispense: 10 capsule;  Refill: 0   2. Burning with urination  - POCT urinalysis dipstick - phenazopyridine (PYRIDIUM) 100 MG tablet; Take 1 tablet (100 mg total) by mouth 3 (three) times daily as needed for pain.  Dispense: 10 tablet; Refill: 0   Delano Metz, FNP-C

## 2016-05-13 NOTE — Progress Notes (Signed)
Pre visit review using our clinic review tool, if applicable. No additional management support is needed unless otherwise documented below in the visit note. 

## 2016-05-14 MED ORDER — NITROFURANTOIN MONOHYD MACRO 100 MG PO CAPS: 100.0000 mg | ORAL_CAPSULE | Freq: Two times a day (BID) | ORAL | 0 refills | Status: DC

## 2016-05-14 NOTE — Telephone Encounter (Signed)
-----   Message from Delano Metz, [place] sent at 05/14/2016 11:00 AM EDT ----- Kermit Balo morning Caryl Comes,  Will you please document in Ms. Harmes chart the prescription that you called in yesterday? Please verify that it was nitrofurantoin 100 mg BID for 5 days.   I will also addend my note from yesterday.  Thanks, Almyra Free

## 2016-05-14 NOTE — Addendum Note (Signed)
Addended by: Delano Metz A on: 05/14/2016 11:07 AM   Modules accepted: Orders

## 2016-05-14 NOTE — Telephone Encounter (Signed)
Called in prescription for Nitrofurantion, macrocrystal- monoydrate (MACROBID) 100 mg. Take one capsule by mouth two times daily for 7 days.   [facility]ed into [facility] in [place].   Called pt to inform them that the prescription was called in.

## 2016-05-15 ENCOUNTER — Encounter: Payer: Self-pay | Admitting: Internal Medicine

## 2016-05-28 ENCOUNTER — Ambulatory Visit (HOSPITAL_BASED_OUTPATIENT_CLINIC_OR_DEPARTMENT_OTHER): Payer: BLUE CROSS/BLUE SHIELD | Admitting: Hematology and Oncology

## 2016-05-28 ENCOUNTER — Telehealth: Payer: Self-pay | Admitting: Hematology and Oncology

## 2016-05-28 ENCOUNTER — Encounter: Payer: Self-pay | Admitting: Hematology and Oncology

## 2016-05-28 DIAGNOSIS — Z79811 Long term (current) use of aromatase inhibitors: Secondary | ICD-10-CM | POA: Diagnosis not present

## 2016-05-28 DIAGNOSIS — C50512 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: Secondary | ICD-10-CM

## 2016-05-28 DIAGNOSIS — D0512 Intraductal carcinoma in situ of left breast: Secondary | ICD-10-CM | POA: Diagnosis not present

## 2016-05-28 DIAGNOSIS — Z170 Estrogen receptor positive status [ER+]: Secondary | ICD-10-CM | POA: Diagnosis not present

## 2016-05-28 NOTE — Assessment & Plan Note (Signed)
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 Breast exam 05/28/2016: Normal 2. Mammograms 09/19/15: Normal. 2. Bone density 04/04/2015: Normal bone density  Patient and her sister take care of her ailing mother age 43 and requires 24/7 home care. This does not give her much time to do exercise. She will try to make some more time to get some activity. Provided info on healthy eating and wellness class.  Return to clinic in 1 year for follow-up

## 2016-05-28 NOTE — Progress Notes (Signed)
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 [facility])   Staging form: Breast, AJCC 7th Edition   - Clinical: Stage 0 (Tis (DCIS), N0, cM0) - Unsigned         Staging comments: Staged at breast conference 11.12.14    - Pathologic: No stage assigned - Unsigned  SUMMARY OF ONCOLOGIC HISTORY:   Breast cancer of lower-outer quadrant of left female breast ([place])   10/07/2013 Surgery    Left breast lumpectomy: DCIS ER positive PR positive, reexcision of positive margins on 10/25/2013, final margins negative     11/08/2013 - 12/27/2013 Radiation Therapy    Adjuvant radiation therapy     01/03/2014 Procedure    Genetic testing negative for breast and ovarian cancer panels     01/24/2014 - 10/16/2014 Anti-estrogen oral therapy    Tamoxifen 20 mg daily 5 years     09/13/2014 Relapse/Recurrence    MRI of the breast revealed a 3.5 cm area of non-mass enhancement left breast lateral 3.5 x 1.9 x 1.6 cm, oval circumscribed T2 bright enhancing mass right breast upper outer quadrant 1 x 0.7 cm, linear non-mass enhancement 11 x 6 x 3 mm     10/07/2014 Surgery    Left breast lumpectomy: DCIS grade 10.6 cm ER 100%, PR 0%, multiple skin tags benign fibroepithelial polyps, reexcision 10/25/2013 benign fibrocystic changes with calcifications     12/27/2014 -  Anti-estrogen oral therapy    Anastrozole 1 mg daily changed to letrozole 2.5 mg daily 01/05/2017due to musculoskeletal aches and pains      CHIEF COMPLIANT:   INTERVAL HISTORY: Katie Becker is a     REVIEW OF SYSTEMS:   Constitutional: Denies fevers, chills or abnormal weight loss Eyes: Denies blurriness of vision Ears, nose, mouth, throat, and face: Denies mucositis or sore throat Respiratory: Denies cough, dyspnea or wheezes Cardiovascular: Denies palpitation, chest discomfort Gastrointestinal:  Denies nausea, heartburn or change in bowel habits Skin:  Denies abnormal skin rashes Lymphatics: Denies new lymphadenopathy or easy bruising Neurological:Denies numbness, tingling or new weaknesses Behavioral/Psych: Mood is stable, no new changes  Extremities: No lower extremity edema Breast:  denies any pain or lumps or nodules in either breasts All other systems were reviewed with the patient and are negative.  I have reviewed the past medical history, past surgical history, social history and family history with the patient and they are unchanged from previous note.  ALLERGIES:  is allergic to penicillins; erythromycin; adhesive [tape]; amoxicillin; shellfish allergy; and sulfamethoxazole.  MEDICATIONS:  Current Outpatient Prescriptions  Medication Sig Dispense Refill  . atorvastatin (LIPITOR) 40 MG tablet TAKE 1 TABLET DAILY 90 tablet 1  . Calcium Carbonate-Vitamin D (CALCIUM + D PO) Take 1 tablet by mouth 2 (two) times daily before a meal.     . cephALEXin (KEFLEX) 500 MG capsule TAKE 4 CAPSULES BY MOUTH 1 HR PRIOR TO DENTAL APPT  3  . cetirizine (ZYRTEC) 10 MG tablet Take 10 mg by mouth daily as needed for allergies.    Marland Kitchen esomeprazole (NEXIUM) 40 MG capsule TAKE 1 CAPSULE DAILY 90 capsule 3  . hydrochlorothiazide (HYDRODIURIL) 25 MG tablet TAKE 1 TABLET DAILY 90 tablet 3  . imiquimod (ALDARA) 5 % cream     . letrozole (FEMARA) 2.5 MG tablet Take 1 tablet (2.5 mg total) by mouth daily. 90 tablet 2  . Multiple Vitamins-Minerals (WOMENS MULTIVITAMIN PLUS PO) Take 1 tablet  by mouth daily.     . nitrofurantoin, macrocrystal-monohydrate, (MACROBID) 100 MG capsule Take 1 capsule (100 mg total) by mouth 2 (two) times daily. 10 capsule 0  . phenazopyridine (PYRIDIUM) 100 MG tablet Take 1 tablet (100 mg total) by mouth 3 (three) times daily as needed for pain. 10 tablet 0  . pseudoephedrine (SUDAFED) 120 MG 12 hr tablet Take 120 mg by mouth 2 (two) times daily as needed.      No current facility-administered medications for this visit.      PHYSICAL EXAMINATION: ECOG PERFORMANCE STATUS: 1 - Symptomatic but completely ambulatory  Vitals:   05/28/16 1020  BP: 120/63  Pulse: 75  Resp: 18  Temp: 98 F (36.7 C)   Filed Weights   05/28/16 1020  Weight: 173 lb 8 oz (78.7 kg)    GENERAL:alert, no distress and comfortable SKIN: skin color, texture, turgor are normal, no rashes or significant lesions EYES: normal, Conjunctiva are pink and non-injected, sclera clear OROPHARYNX:no exudate, no erythema and lips, buccal mucosa, and tongue normal  NECK: supple, thyroid normal size, non-tender, without nodularity LYMPH:  no palpable lymphadenopathy in the cervical, axillary or inguinal LUNGS: clear to auscultation and percussion with normal breathing effort HEART: regular rate & rhythm and no murmurs and no lower extremity edema ABDOMEN:abdomen soft, non-tender and normal bowel sounds MUSCULOSKELETAL:no cyanosis of digits and no clubbing  NEURO: alert & oriented x 3 with fluent speech, no focal motor/sensory deficits EXTREMITIES: No lower extremity edema BREAST: No palpable masses or nodules in either right or left breasts. No palpable axillary supraclavicular or infraclavicular adenopathy no breast tenderness or nipple discharge. (exam performed in the presence of a chaperone)  LABORATORY DATA:  I have reviewed the data as listed   Chemistry      Component Value Date/Time   NA 142 09/22/2015 0755   NA 143 07/14/2014 1104   K 3.7 09/22/2015 0755   K 3.4 (L) 07/14/2014 1104   CL 101 09/22/2015 0755   CO2 32 09/22/2015 0755   CO2 28 07/14/2014 1104   BUN 15 09/22/2015 0755   BUN 14.1 07/14/2014 1104   CREATININE 0.99 09/22/2015 0755   CREATININE 1.1 07/14/2014 1104      Component Value Date/Time   CALCIUM 9.8 09/22/2015 0755   CALCIUM 9.6 07/14/2014 1104   ALKPHOS 92 09/22/2015 0755   ALKPHOS 87 07/14/2014 1104   AST 68 (H) 09/22/2015 0755   AST 77 (H) 07/14/2014 1104   ALT 70 (H) 09/22/2015 0755   ALT 65 (H)  07/14/2014 1104   BILITOT 0.8 09/22/2015 0755   BILITOT 0.66 07/14/2014 1104       Lab Results  Component Value Date   WBC 7.6 09/22/2015   HGB 14.6 09/22/2015   HCT 43.3 09/22/2015   MCV 88.0 09/22/2015   PLT 213.0 09/22/2015   NEUTROABS 4.8 09/22/2015     ASSESSMENT & PLAN:  Breast cancer of lower-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 Breast exam 05/28/2016: Normal 2. Mammograms 09/19/15: Normal. Ultrasound revealed fat necrosis 2. Bone density 04/04/2015: Normal bone density  Patient and her sister take  care of her ailing mother age 29 and requires 24/7 home care. This does not give her much time to do exercise. She will try to make some more time to get some activity. Patient enjoyed the living well class and is incorporating that in her diet.   She was recently diagnosed with vulvar lesion which is being treated with Imiquinod cream She also had a recent UTI which was treated with antibiotics  Return to clinic in 1 year for follow-up   No orders of the defined types were placed in this encounter.  The patient has a good understanding of the overall plan. she agrees with it. she will call with any problems that may develop before the next visit here.   Rulon Eisenmenger, MD 05/28/16

## 2016-05-28 NOTE — Telephone Encounter (Signed)
appt made and avs printed °

## 2016-06-11 ENCOUNTER — Other Ambulatory Visit: Payer: Self-pay | Admitting: Internal Medicine

## 2016-06-11 NOTE — Telephone Encounter (Signed)
Rx refill sent to pharmacy. 

## 2016-07-29 ENCOUNTER — Other Ambulatory Visit: Payer: Self-pay | Admitting: Hematology and Oncology

## 2016-07-29 DIAGNOSIS — Z853 Personal history of malignant neoplasm of breast: Secondary | ICD-10-CM

## 2016-08-06 ENCOUNTER — Encounter: Payer: Self-pay | Admitting: Internal Medicine

## 2016-08-20 ENCOUNTER — Other Ambulatory Visit: Payer: Self-pay | Admitting: Hematology and Oncology

## 2016-08-20 DIAGNOSIS — C50512 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: Secondary | ICD-10-CM

## 2016-09-10 ENCOUNTER — Telehealth: Payer: Self-pay | Admitting: Internal Medicine

## 2016-09-10 NOTE — Telephone Encounter (Signed)
Patient Name: Katie Becker DOB: [birth year] Initial Comment Caller states she would like to know how long they would be contagious: Coughing, congestion, headache, no fever. Nurse Assessment Nurse: Ronnald Ramp, RN, [place] Date/Time (Eastern Time): 09/10/2016 9:33:25 AM Confirm and document reason for call. If symptomatic, describe symptoms. You must click the next button to save text entered. ---Caller states she has questions about being contagious. She has a cough, congestion, and fatigue. Denies fever. Triaged offered and declined. Does the patient have any new or worsening symptoms? ---Yes Will a triage be completed? ---No Select reason for no triage. ---Patient declined Please document clinical information provided and list any resource used. ---Told caller that colds are spread through the mucus produced. So as long as there are symptoms, it can be spread. Told caller that if she has a fever, she needs to try to stay away from other people. Told caller it is important to cover her mouth and nose if she is coughing or sneezing and to practice strict handwashing. Guidelines Guideline Title Affirmed Question Affirmed Notes Final Disposition User Clinical Call Ronnald Ramp, RN, [employer]

## 2016-09-10 NOTE — Telephone Encounter (Signed)
Left message on voicemail to call office.  

## 2016-09-10 NOTE — Telephone Encounter (Signed)
Agree with [employer]'s advice (provided below). Please contact patient if they require further evaluation:  Per [employer]: ---Told caller that colds are spread through the mucus produced. So as long as there are symptoms, it can be spread. Told caller that if she has a fever, she needs to try to stay away from other people. Told caller it is important to cover her mouth and nose if she is coughing or sneezing and to practice strict handwashing.

## 2016-09-11 NOTE — Telephone Encounter (Signed)
Pt called back, asked her how she was feeling? Pt said okay affecting her voice today. Asked pt if she would like to be seen? Pt said no it is going through the house and she is already on Keflex for an infection. Told pt okay if symptoms become worse please call for an appt. Pt verbalized understanding.

## 2016-09-16 ENCOUNTER — Encounter: Payer: Self-pay | Admitting: Internal Medicine

## 2016-09-20 ENCOUNTER — Ambulatory Visit
Admission: RE | Admit: 2016-09-20 | Discharge: 2016-09-20 | Disposition: A | Discharge status: Home / Self Care | Payer: BLUE CROSS/BLUE SHIELD | Source: Ambulatory Visit | Attending: Hematology and Oncology | Admitting: Hematology and Oncology

## 2016-09-20 DIAGNOSIS — Z853 Personal history of malignant neoplasm of breast: Secondary | ICD-10-CM

## 2016-09-25 ENCOUNTER — Other Ambulatory Visit (INDEPENDENT_AMBULATORY_CARE_PROVIDER_SITE_OTHER): Payer: BLUE CROSS/BLUE SHIELD

## 2016-09-25 ENCOUNTER — Other Ambulatory Visit: Payer: BLUE CROSS/BLUE SHIELD

## 2016-09-25 DIAGNOSIS — Z0000 Encounter for general adult medical examination without abnormal findings: Secondary | ICD-10-CM

## 2016-09-25 LAB — CBC WITH DIFFERENTIAL/PLATELET
BASOS PCT: 0.6 % (ref 0.0–3.0)
Basophils Absolute: 0 10*3/uL (ref 0.0–0.1)
EOS ABS: 0.1 10*3/uL (ref 0.0–0.7)
EOS PCT: 1.7 % (ref 0.0–5.0)
HEMATOCRIT: 43 % (ref 36.0–46.0)
HEMOGLOBIN: 14.5 g/dL (ref 12.0–15.0)
LYMPHS PCT: 24.2 % (ref 12.0–46.0)
Lymphs Abs: 1.7 10*3/uL (ref 0.7–4.0)
MCHC: 33.6 g/dL (ref 30.0–36.0)
MCV: 85.8 fl (ref 78.0–100.0)
Monocytes Absolute: 0.4 10*3/uL (ref 0.1–1.0)
Monocytes Relative: 5.7 % (ref 3.0–12.0)
NEUTROS ABS: 4.7 10*3/uL (ref 1.4–7.7)
Neutrophils Relative %: 67.8 % (ref 43.0–77.0)
PLATELETS: 253 10*3/uL (ref 150.0–400.0)
RBC: 5.01 Mil/uL (ref 3.87–5.11)
RDW: 12.7 % (ref 11.5–15.5)
WBC: 7 10*3/uL (ref 4.0–10.5)

## 2016-09-25 LAB — HEPATIC FUNCTION PANEL
ALBUMIN: 4.3 g/dL (ref 3.5–5.2)
ALT: 31 U/L (ref 0–35)
AST: 28 U/L (ref 0–37)
Alkaline Phosphatase: 107 U/L (ref 39–117)
BILIRUBIN DIRECT: 0.2 mg/dL (ref 0.0–0.3)
TOTAL PROTEIN: 6.8 g/dL (ref 6.0–8.3)
Total Bilirubin: 0.8 mg/dL (ref 0.2–1.2)

## 2016-09-25 LAB — BASIC METABOLIC PANEL
BUN: 14 mg/dL (ref 6–23)
CHLORIDE: 103 meq/L (ref 96–112)
CO2: 31 meq/L (ref 19–32)
CREATININE: 0.96 mg/dL (ref 0.40–1.20)
Calcium: 9.8 mg/dL (ref 8.4–10.5)
GFR: 62.27 mL/min (ref >60.00)
Glucose, Bld: 110 mg/dL — ABNORMAL HIGH (ref 70–99)
POTASSIUM: 3.5 meq/L (ref 3.5–5.1)
Sodium: 143 mEq/L (ref 135–145)

## 2016-09-25 LAB — POC URINALSYSI DIPSTICK (AUTOMATED)
Bilirubin, UA: NEGATIVE
Blood, UA: NEGATIVE
GLUCOSE UA: NEGATIVE
Ketones, UA: NEGATIVE
NITRITE UA: NEGATIVE
PROTEIN UA: NEGATIVE
SPEC GRAV UA: 1.01
UROBILINOGEN UA: 2
pH, UA: 7

## 2016-09-25 LAB — LIPID PANEL
CHOL/HDL RATIO: 4
CHOLESTEROL: 172 mg/dL (ref 0–200)
HDL: 47.6 mg/dL (ref >39.00)
LDL Cholesterol: 93 mg/dL (ref 0–99)
NonHDL: 124.61
TRIGLYCERIDES: 159 mg/dL — AB (ref 0.0–149.0)
VLDL: 31.8 mg/dL (ref 0.0–40.0)

## 2016-09-25 LAB — TSH: TSH: 1.07 u[IU]/mL (ref 0.35–4.50)

## 2016-10-02 ENCOUNTER — Encounter: Payer: Self-pay | Admitting: Internal Medicine

## 2016-10-02 ENCOUNTER — Ambulatory Visit (INDEPENDENT_AMBULATORY_CARE_PROVIDER_SITE_OTHER): Payer: BLUE CROSS/BLUE SHIELD | Admitting: Internal Medicine

## 2016-10-02 VITALS — BP 120/72 | HR 77 | Temp 98.2°F | Ht 62.0 in | Wt 165.8 lb

## 2016-10-02 DIAGNOSIS — Z0000 Encounter for general adult medical examination without abnormal findings: Secondary | ICD-10-CM | POA: Diagnosis not present

## 2016-10-02 NOTE — Patient Instructions (Addendum)
Limit your sodium (Salt) intake    It is important that you exercise regularly, at least 20 minutes 3 to 4 times per week.  If you develop chest pain or shortness of breath seek  medical attention.  You need to lose weight.  Consider a lower calorie diet and regular exercise.  Please check your blood pressure on a regular basis.  If it is consistently greater than 150/90, please make an office appointment.  Return in one year for follow-up    Health Maintenance for Postmenopausal Women Introduction Menopause is a normal process in which your reproductive ability comes to an end. This process happens gradually over a span of months to years, usually between the ages of 72 and 48. Menopause is complete when you have missed 12 consecutive menstrual periods. It is important to talk with your health care provider about some of the most common conditions that affect postmenopausal women, such as heart disease, cancer, and bone loss (osteoporosis). Adopting a healthy lifestyle and getting preventive care can help to promote your health and wellness. Those actions can also lower your chances of developing some of these common conditions. What should I know about menopause? During menopause, you may experience a number of symptoms, such as:  Moderate-to-severe hot flashes.  Night sweats.  Decrease in sex drive.  Mood swings.  Headaches.  Tiredness.  Irritability.  Memory problems.  Insomnia. Choosing to treat or not to treat menopausal changes is an individual decision that you make with your health care provider. What should I know about hormone replacement therapy and supplements? Hormone therapy products are effective for treating symptoms that are associated with menopause, such as hot flashes and night sweats. Hormone replacement carries certain risks, especially as you become older. If you are thinking about using estrogen or estrogen with progestin treatments, discuss the benefits  and risks with your health care provider. What should I know about heart disease and stroke? Heart disease, heart attack, and stroke become more likely as you age. This may be due, in part, to the hormonal changes that your body experiences during menopause. These can affect how your body processes dietary fats, triglycerides, and cholesterol. Heart attack and stroke are both medical emergencies. There are many things that you can do to help prevent heart disease and stroke:  Have your blood pressure checked at least every 1-2 years. High blood pressure causes heart disease and increases the risk of stroke.  If you are 52-46 years old, ask your health care provider if you should take aspirin to prevent a heart attack or a stroke.  Do not use any tobacco products, including cigarettes, chewing tobacco, or electronic cigarettes. If you need help quitting, ask your health care provider.  It is important to eat a healthy diet and maintain a healthy weight.  Be sure to include plenty of vegetables, fruits, low-fat dairy products, and lean protein.  Avoid eating foods that are high in solid fats, added sugars, or salt (sodium).  Get regular exercise. This is one of the most important things that you can do for your health.  Try to exercise for at least 150 minutes each week. The type of exercise that you do should increase your heart rate and make you sweat. This is known as moderate-intensity exercise.  Try to do strengthening exercises at least twice each week. Do these in addition to the moderate-intensity exercise.  Know your numbers.Ask your health care provider to check your cholesterol and your blood glucose. Continue  to have your blood tested as directed by your health care provider. What should I know about cancer screening? There are several types of cancer. Take the following steps to reduce your risk and to catch any cancer development as early as possible. Breast Cancer  Practice  breast self-awareness.  This means understanding how your breasts normally appear and feel.  It also means doing regular breast self-exams. Let your health care provider know about any changes, no matter how small.  If you are 43 or older, have a clinician do a breast exam (clinical breast exam or CBE) every year. Depending on your age, family history, and medical history, it may be recommended that you also have a yearly breast X-ray (mammogram).  If you have a family history of breast cancer, talk with your health care provider about genetic screening.  If you are at high risk for breast cancer, talk with your health care provider about having an MRI and a mammogram every year.  Breast cancer (BRCA) gene test is recommended for women who have family members with BRCA-related cancers. Results of the assessment will determine the need for genetic counseling and BRCA1 and for BRCA2 testing. BRCA-related cancers include these types:  Breast. This occurs in males or females.  Ovarian.  Tubal. This may also be called fallopian tube cancer.  Cancer of the abdominal or pelvic lining (peritoneal cancer).  Prostate.  Pancreatic. Cervical, Uterine, and Ovarian Cancer  Your health care provider may recommend that you be screened regularly for cancer of the pelvic organs. These include your ovaries, uterus, and vagina. This screening involves a pelvic exam, which includes checking for microscopic changes to the surface of your cervix (Pap test).  For women ages 21-65, health care providers may recommend a pelvic exam and a Pap test every three years. For women ages 19-65, they may recommend the Pap test and pelvic exam, combined with testing for human papilloma virus (HPV), every five years. Some types of HPV increase your risk of cervical cancer. Testing for HPV may also be done on women of any age who have unclear Pap test results.  Other health care providers may not recommend any screening for  nonpregnant women who are considered low risk for pelvic cancer and have no symptoms. Ask your health care provider if a screening pelvic exam is right for you.  If you have had past treatment for cervical cancer or a condition that could lead to cancer, you need Pap tests and screening for cancer for at least 20 years after your treatment. If Pap tests have been discontinued for you, your risk factors (such as having a new sexual partner) need to be reassessed to determine if you should start having screenings again. Some women have medical problems that increase the chance of getting cervical cancer. In these cases, your health care provider may recommend that you have screening and Pap tests more often.  If you have a family history of uterine cancer or ovarian cancer, talk with your health care provider about genetic screening.  If you have vaginal bleeding after reaching menopause, tell your health care provider.  There are currently no reliable tests available to screen for ovarian cancer. Lung Cancer  Lung cancer screening is recommended for adults 84-47 years old who are at high risk for lung cancer because of a history of smoking. A yearly low-dose CT scan of the lungs is recommended if you:  Currently smoke.  Have a history of at least 30 pack-years  of smoking and you currently smoke or have quit within the past 15 years. A pack-year is smoking an average of one pack of cigarettes per day for one year. Yearly screening should:  Continue until it has been 15 years since you quit.  Stop if you develop a health problem that would prevent you from having lung cancer treatment. Colorectal Cancer  This type of cancer can be detected and can often be prevented.  Routine colorectal cancer screening usually begins at age 13 and continues through age 48.  If you have risk factors for colon cancer, your health care provider may recommend that you be screened at an earlier age.  If you have  a family history of colorectal cancer, talk with your health care provider about genetic screening.  Your health care provider may also recommend using home test kits to check for hidden blood in your stool.  A small camera at the end of a tube can be used to examine your colon directly (sigmoidoscopy or colonoscopy). This is done to check for the earliest forms of colorectal cancer.  Direct examination of the colon should be repeated every 5-10 years until age 34. However, if early forms of precancerous polyps or small growths are found or if you have a family history or genetic risk for colorectal cancer, you may need to be screened more often. Skin Cancer  Check your skin from head to toe regularly.  Monitor any moles. Be sure to tell your health care provider:  About any new moles or changes in moles, especially if there is a change in a mole's shape or color.  If you have a mole that is larger than the size of a pencil eraser.  If any of your family members has a history of skin cancer, especially at a young age, talk with your health care provider about genetic screening.  Always use sunscreen. Apply sunscreen liberally and repeatedly throughout the day.  Whenever you are outside, protect yourself by wearing long sleeves, pants, a wide-brimmed hat, and sunglasses. What should I know about osteoporosis? Osteoporosis is a condition in which bone destruction happens more quickly than new bone creation. After menopause, you may be at an increased risk for osteoporosis. To help prevent osteoporosis or the bone fractures that can happen because of osteoporosis, the following is recommended:  If you are 27-68 years old, get at least 1,000 mg of calcium and at least 600 mg of vitamin D per day.  If you are older than age 21 but younger than age 12, get at least 1,200 mg of calcium and at least 600 mg of vitamin D per day.  If you are older than age 8, get at least 1,200 mg of calcium and  at least 800 mg of vitamin D per day. Smoking and excessive alcohol intake increase the risk of osteoporosis. Eat foods that are rich in calcium and vitamin D, and do weight-bearing exercises several times each week as directed by your health care provider. What should I know about how menopause affects my mental health? Depression may occur at any age, but it is more common as you become older. Common symptoms of depression include:  Low or sad mood.  Changes in sleep patterns.  Changes in appetite or eating patterns.  Feeling an overall lack of motivation or enjoyment of activities that you previously enjoyed.  Frequent crying spells. Talk with your health care provider if you think that you are experiencing depression. What should I  know about immunizations? It is important that you get and maintain your immunizations. These include:  Tetanus, diphtheria, and pertussis (Tdap) booster vaccine.  Influenza every year before the flu season begins.  Pneumonia vaccine.  Shingles vaccine. Your health care provider may also recommend other immunizations. This information is not intended to replace advice given to you by your health care provider. Make sure you discuss any questions you have with your health care provider. Document Released: 11/29/2005 Document Revised: 04/26/2016 Document Reviewed: 07/11/2015  2017 [employer]

## 2016-10-02 NOTE — Progress Notes (Signed)
Subjective:    Patient ID: Katie Becker, female    DOB: July 05, 1951, 65 y.o.   MRN: [id number]  HPI  65 year old patient who is seen today for a wellness exam.  She has done quite well.  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 She does have a history of hepatomegaly and NAFLD.  Recent LFTs are normal.  She has a history of essential hypertension and dyslipidemia. She is followed closely by oncology with history of left breast cancer.  She remains on femara- bone density studies have been normal the last approximately one year ago  Past Medical History:  Diagnosis Date  . Arthritis    back  . 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 DIVERTICULOSIS, COLON 04/20/2007  . GERD 04/20/2007  . H/O hiatal hernia   . Heart murmur    told by PCP- years ago-never had echo  . HEPATOMEGALY, HX OF 12/21/2009  . HYPERLIPIDEMIA 04/20/2007  . HYPERTENSION 04/20/2007  . LOW BACK PAIN 08/16/2008  . MENOPAUSAL SYNDROME 04/20/2007  . PEPTIC ULCER DISEASE 09/06/2010  . Wears glasses      Social History   Social History  . Marital status: Widowed    Spouse name: N/A  . Number of children: 2  . Years of education: N/A   Occupational History  .  [employer]Cust Serv  And  [employer]   Social History Main Topics  . Smoking status: Former Smoker    Quit date: 10/21/1978  . Smokeless tobacco: Never Used  . Alcohol use No  . Drug use: No  . Sexual activity: Not Currently   Other Topics Concern  . Not on file   Social History Narrative  . No narrative on file    Past Surgical History:  Procedure Laterality Date  . ABDOMINAL HYSTERECTOMY    . BREAST LUMPECTOMY WITH NEEDLE LOCALIZATION Left 10/07/2013   Procedure: BREAST LUMPECTOMY WITH NEEDLE LOCALIZATION EXCISION OF BILATERAL AXILLARY SKIN TAGS ;  Surgeon: Edward Jolly, MD;  Location: [place];  Service: General;  Laterality: Left;  . BREAST  SURGERY     30 rounds radiation  . CARPAL TUNNEL RELEASE Bilateral   . CHOLECYSTECTOMY    . COLONOSCOPY    . RE-EXCISION OF BREAST LUMPECTOMY Left 10/25/2013   Procedure: RE-EXCISION OF BREAST LUMPECTOMY;  Surgeon: Edward Jolly, MD;  Location: [facility];  Service: General;  Laterality: Left;  clean wound class  . TUBAL LIGATION      Family History  Problem Relation Age of Onset  . Hypertension Mother   . Hyperlipidemia Mother   . Heart disease Mother     congestive heart failure  . Cancer Father     hx of melanoma  . Hypertension Sister   . Skin cancer Sister 9    basal cell carcinoma  . Hypertension Brother   . Heart disease Maternal Grandmother 15  . Prostate cancer Cousin   . Breast cancer Maternal Aunt 60  . Colon cancer Paternal Grandmother 77  . Heart disease Maternal Aunt 42    Allergies  Allergen Reactions  . Penicillins Anaphylaxis  . Erythromycin Nausea And Vomiting  . Adhesive [Tape]     rash  . Amoxicillin     REACTION: unspecified  . Shellfish Allergy     welts  . Sulfamethoxazole Hives    REACTION: unspecified  Current Outpatient Prescriptions on File Prior to Visit  Medication Sig Dispense Refill  . atorvastatin (LIPITOR) 40 MG tablet TAKE 1 TABLET DAILY 90 tablet 1  . Calcium Carbonate-Vitamin D (CALCIUM + D PO) Take 1 tablet by mouth 2 (two) times daily before a meal.     . cetirizine (ZYRTEC) 10 MG tablet Take 10 mg by mouth daily as needed for allergies.    Marland Kitchen esomeprazole (NEXIUM) 40 MG capsule TAKE 1 CAPSULE DAILY 90 capsule 3  . hydrochlorothiazide (HYDRODIURIL) 25 MG tablet TAKE 1 TABLET DAILY 90 tablet 3  . imiquimod (ALDARA) 5 % cream     . letrozole (FEMARA) 2.5 MG tablet TAKE 1 TABLET DAILY 90 tablet 3  . Multiple Vitamins-Minerals (WOMENS MULTIVITAMIN PLUS PO) Take 1 tablet by mouth daily.     . pseudoephedrine (SUDAFED) 120 MG 12 hr tablet Take 120 mg by mouth 2 (two) times daily as needed.      No current  facility-administered medications on file prior to visit.     BP 120/72 (BP Location: Left Arm, Patient Position: Sitting, Cuff Size: Normal)   Pulse 77   Temp 98.2 F (36.8 C) (Oral)   Ht 5\' 2"  (1.575 m)   Wt 165 lb 12 oz (75.2 kg)   SpO2 98%   BMI 30.32 kg/m     Review of Systems  Constitutional: Negative.   HENT: Negative for congestion, dental problem, hearing loss, rhinorrhea, sinus pressure, sore throat and tinnitus.   Eyes: Positive for pain. Negative for discharge and visual disturbance.       Followed by ophthalmology for calazion  Respiratory: Positive for cough. Negative for shortness of breath.   Cardiovascular: Negative for chest pain, palpitations and leg swelling.  Gastrointestinal: Negative for abdominal distention, abdominal pain, blood in stool, constipation, diarrhea, nausea and vomiting.  Genitourinary: Negative for difficulty urinating, dysuria, flank pain, frequency, hematuria, pelvic pain, urgency, vaginal bleeding, vaginal discharge and vaginal pain.  Musculoskeletal: Negative for arthralgias, gait problem and joint swelling.  Skin: Negative for rash.  Neurological: Negative for dizziness, syncope, speech difficulty, weakness, numbness and headaches.  Hematological: Negative for adenopathy.  Psychiatric/Behavioral: Negative for agitation, behavioral problems and dysphoric mood. The patient is not nervous/anxious.        Objective:   Physical Exam  Constitutional: She is oriented to person, place, and time. She appears well-developed and well-nourished.  HENT:  Head: Normocephalic and atraumatic.  Right Ear: External ear normal.  Left Ear: External ear normal.  Mouth/Throat: Oropharynx is clear and moist.  Eyes: Conjunctivae and EOM are normal.  Resolving inflammatory changes left upper lid  Neck: Normal range of motion. Neck supple. No JVD present. No thyromegaly present.  Cardiovascular: Normal rate, regular rhythm, normal heart sounds and intact  distal pulses.   No murmur heard. Pulmonary/Chest: Effort normal and breath sounds normal. She has no wheezes. She has no rales.  Abdominal: Soft. Bowel sounds are normal. She exhibits no distension and no mass. There is no tenderness. There is no rebound and no guarding.  Liver edge palpable on inspiration  Genitourinary: Vagina normal.  Musculoskeletal: Normal range of motion. She exhibits no edema or tenderness.  Neurological: She is alert and oriented to person, place, and time. She has normal reflexes. No cranial nerve deficit. She exhibits normal muscle tone. Coordination normal.  Skin: Skin is warm and dry. No rash noted.  Psychiatric: She has a normal mood and affect. Her behavior is normal.  Assessment & Plan:   Preventive health examination History left breast cancer.  Follow-up oncology Dyslipidemia.  Continue statin therapy Nonalcoholic fatty liver disease.  Stable  Continue calcium and vitamin D supplementation No change in medical regimen Follow-up 6-12 months Oncology follow-up as scheduled  Nyoka Cowden

## 2016-10-02 NOTE — Progress Notes (Signed)
Pre visit review using our clinic review tool, if applicable. No additional management support is needed unless otherwise documented below in the visit note. 

## 2016-12-08 ENCOUNTER — Other Ambulatory Visit: Payer: Self-pay | Admitting: Internal Medicine

## 2017-03-09 ENCOUNTER — Encounter: Payer: Self-pay | Admitting: Internal Medicine

## 2017-03-10 ENCOUNTER — Other Ambulatory Visit: Payer: Self-pay | Admitting: Internal Medicine

## 2017-05-21 ENCOUNTER — Encounter: Payer: Self-pay | Admitting: Internal Medicine

## 2017-05-21 ENCOUNTER — Ambulatory Visit (INDEPENDENT_AMBULATORY_CARE_PROVIDER_SITE_OTHER): Payer: BLUE CROSS/BLUE SHIELD | Admitting: Internal Medicine

## 2017-05-21 VITALS — BP 138/76 | HR 93 | Temp 98.2°F | Ht 62.0 in | Wt 174.6 lb

## 2017-05-21 DIAGNOSIS — S161XXA Strain of muscle, fascia and tendon at neck level, initial encounter: Secondary | ICD-10-CM

## 2017-05-21 DIAGNOSIS — E785 Hyperlipidemia, unspecified: Secondary | ICD-10-CM | POA: Diagnosis not present

## 2017-05-21 DIAGNOSIS — I10 Essential (primary) hypertension: Secondary | ICD-10-CM

## 2017-05-21 MED ORDER — METHYLPREDNISOLONE ACETATE 80 MG/ML IJ SUSP
80.0000 mg | Freq: Once | INTRAMUSCULAR | Status: AC
  Administered 2017-05-21: 80 mg via INTRAMUSCULAR

## 2017-05-21 NOTE — Patient Instructions (Addendum)
Cervical Collar A cervical collar is a device that supports your chin and the back of your head. It is used after a severe neck injury to protect your head and neck. It does this by restricting the movement of the top part of your spine, which is located in your neck. A cervical collar may be used when you have:  A fractured neck.  Ligament damage.  A spinal cord injury.  What instructions should I follow?  Wear the collar for as long as your health care provider instructs.  Follow your health care provider's instructions about how to put on and take off your collar.  Do not make your collar so tight that you feel pain or it is hard for you to breathe.  Do not remove the collar unless your health care provider says it is okay. Ask your health care provider if you can remove the collar for showering or eating or to apply ice.  Do not drive a car until your health care provider says it is okay.  Keep all follow-up visits as directed by your health care provider. This is important. Any delay in getting necessary care can keep your injury from healing properly.  Apply ice to the injured area: ? Put ice in a plastic bag. ? Place a towel between your skin and the bag. ? Leave the ice on for 20 minutes, 2-3 times per day for the first 2 days. This information is not intended to replace advice given to you by your health care provider. Make sure you discuss any questions you have with your health care provider. Document Released: 06/29/2004 Document Revised: 02/15/2016 Document Reviewed: 05/16/2014 Elsevier Interactive Patient Education  2017 [employer] Inc[employer]  Neck Exercises Neck exercises can be important for many reasons:  They can help you to improve and maintain flexibility in your neck. This can be especially important as you age.  They can help to make your neck stronger. This can make movement easier.  They can reduce or prevent neck pain.  They may help your upper back.  Ask  your health care provider which neck exercises would be best for you. Exercises Neck Press Repeat this exercise 10 times. Do it first thing in the morning and right before bed or as told by your health care provider. 1. Lie on your back on a firm bed or on the floor with a pillow under your head. 2. Use your neck muscles to push your head down on the pillow and straighten your spine. 3. Hold the position as well as you can. Keep your head facing up and your chin tucked. 4. Slowly count to 5 while holding this position. 5. Relax for a few seconds. Then repeat.  Isometric Strengthening Do a full set of these exercises 2 times a day or as told by your health care provider. 1. Sit in a supportive chair and place your hand on your forehead. 2. Push forward with your head and neck while pushing back with your hand. Hold for 10 seconds. 3. Relax. Then repeat the exercise 3 times. 4. Next, do thesequence again, this time putting your hand against the back of your head. Use your head and neck to push backward against the hand pressure. 5. Finally, do the same exercise on either side of your head, pushing sideways against the pressure of your hand.  Prone Head Lifts Repeat this exercise 5 times. Do this 2 times a day or as told by your health care provider. 1.  Lie face-down, resting on your elbows so that your chest and upper back are raised. 2. Start with your head facing downward, near your chest. Position your chin either on or near your chest. 3. Slowly lift your head upward. Lift until you are looking straight ahead. Then continue lifting your head as far back as you can stretch. 4. Hold your head up for 5 seconds. Then slowly lower it to your starting position.  Supine Head Lifts Repeat this exercise 8-10 times. Do this 2 times a day or as told by your health care provider. 1. Lie on your back, bending your knees to point to the ceiling and keeping your feet flat on the floor. 2. Lift your  head slowly off the floor, raising your chin toward your chest. 3. Hold for 5 seconds. 4. Relax and repeat.  Scapular Retraction Repeat this exercise 5 times. Do this 2 times a day or as told by your health care provider. 1. Stand with your arms at your sides. Look straight ahead. 2. Slowly pull both shoulders backward and downward until you feel a stretch between your shoulder blades in your upper back. 3. Hold for 10-30 seconds. 4. Relax and repeat.  Contact a health care provider if:  Your neck pain or discomfort gets much worse when you do an exercise.  Your neck pain or discomfort does not improve within 2 hours after you exercise. If you have any of these problems, stop exercising right away. Do not do the exercises again unless your health care provider says that you can. Get help right away if:  You develop sudden, severe neck pain. If this happens, stop exercising right away. Do not do the exercises again unless your health care provider says that you can. Exercises Neck Stretch  Repeat this exercise 3-5 times. 1. Do this exercise while standing or while sitting in a chair. 2. Place your feet flat on the floor, shoulder-width apart. 3. Slowly turn your head to the right. Turn it all the way to the right so you can look over your right shoulder. Do not tilt or tip your head. 4. Hold this position for 10-30 seconds. 5. Slowly turn your head to the left, to look over your left shoulder. 6. Hold this position for 10-30 seconds.  Neck Retraction Repeat this exercise 8-10 times. Do this 3-4 times a day or as told by your health care provider. 1. Do this exercise while standing or while sitting in a sturdy chair. 2. Look straight ahead. Do not bend your neck. 3. Use your fingers to push your chin backward. Do not bend your neck for this movement. Continue to face straight ahead. If you are doing the exercise properly, you will feel a slight sensation in your throat and a stretch  at the back of your neck. 4. Hold the stretch for 1-2 seconds. Relax and repeat.  This information is not intended to replace advice given to you by your health care provider. Make sure you discuss any questions you have with your health care provider. Document Released: 09/18/2015 Document Revised: 03/14/2016 Document Reviewed: 04/17/2015 Elsevier Interactive Patient Education  2017 Elsevier Inc.  Cervical Sprain A cervical sprain is a stretch or tear in the tissues that connect bones (ligaments) in the neck. Most neck (cervical) sprains get better in 4-6 weeks. Follow these instructions at home: If you have a neck collar:  Wear it as told by your doctor. Do not take off (do not remove) the collar unless  your doctor says that this is safe.  Ask your doctor before adjusting your collar.  If you have long hair, keep it outside of the collar.  Ask your doctor if you may take off the collar for cleaning and bathing. If you may take off the collar: ? Follow instructions from your doctor about how to take off the collar safely. ? Clean the collar by wiping it with mild soap and water. Let it air-dry all the way. ? If your collar has removable pads:  Take the pads out every 1-2 days.  Hand wash the pads with soap and water.  Let the pads air-dry all the way before you put them back in the collar. Do not dry them in a clothes dryer. Do not dry them with a hair dryer. ? Check your skin under the collar for irritation or sores. If you see any, tell your doctor. Managing pain, stiffness, and swelling  Use a cervical traction device, if told by your doctor.  If told, put heat on the affected area. Do this before exercises (physical therapy) or as often as told by your doctor. Use the heat source that your doctor recommends, such as a moist heat pack or a heating pad. ? Place a towel between your skin and the heat source. ? Leave the heat on for 20-30 minutes. ? Take the heat off (remove the  heat) if your skin turns bright red. This is very important if you cannot feel pain, heat, or cold. You may have a greater risk of getting burned.  Put ice on the affected area. ? Put ice in a plastic bag. ? Place a towel between your skin and the bag. ? Leave the ice on for 20 minutes, 2-3 times a day. Activity  Do not drive while wearing a neck collar. If you do not have a neck collar, ask your doctor if it is safe to drive.  Do not drive or use heavy machinery while taking prescription pain medicine or muscle relaxants, unless your doctor approves.  Do not lift anything that is heavier than 10 lb (4.5 kg) until your doctor tells you that it is safe.  Rest as told by your doctor.  Avoid activities that make you feel worse. Ask your doctor what activities are safe for you.  Do exercises as told by your doctor or physical therapist. Preventing neck sprain  Practice good posture. Adjust your workstation to help with this, if needed.  Exercise regularly as told by your doctor or physical therapist.  Avoid activities that are risky or may cause a neck sprain (cervical sprain). General instructions  Take over-the-counter and prescription medicines only as told by your doctor.  Do not use any products that contain nicotine or tobacco. This includes cigarettes and e-cigarettes. If you need help quitting, ask your doctor.  Keep all follow-up visits as told by your doctor. This is important. Contact a doctor if:  You have pain or other symptoms that get worse.  You have symptoms that do not get better after 2 weeks.  You have pain that does not get better with medicine.  You start to have new, unexplained symptoms.  You have sores or irritated skin from wearing your neck collar. Get help right away if:  You have very bad pain.  You have any of the following in any part of your body: ? Loss of feeling (numbness). ? Tingling. ? Weakness.  You cannot move a part of your body  (you  have paralysis).  Your activity level does not improve. Summary  A cervical sprain is a stretch or tear in the tissues that connect bones (ligaments) in the neck.  If you have a neck (cervical) collar, do not take off the collar unless your doctor says that this is safe.  Put ice on affected areas as told by your doctor.  Put heat on affected areas as told by your doctor.  Good posture and regular exercise can help prevent a neck sprain from happening again. This information is not intended to replace advice given to you by your health care provider. Make sure you discuss any questions you have with your health care provider. Document Released: 03/25/2008 Document Revised: 06/18/2016 Document Reviewed: 06/18/2016 Elsevier Interactive Patient Education  2017 [employer].

## 2017-05-21 NOTE — Progress Notes (Signed)
Subjective:    Patient ID: Katie Becker, female    DOB: 11/12/1950, 66 y.o.   MRN: [id number]  HPI  66 year old patient who presents with a three-day history of posterior neck and occipital scalp discomfort. She has been spending considerable time canning vegetables. No prior history of neck pain.  No radicular symptoms.  Past Medical History:  Diagnosis Date  . Arthritis    back  . 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 DIVERTICULOSIS, COLON 04/20/2007  . GERD 04/20/2007  . H/O hiatal hernia   . Heart murmur    told by PCP- years ago-never had echo  . HEPATOMEGALY, HX OF 12/21/2009  . HYPERLIPIDEMIA 04/20/2007  . HYPERTENSION 04/20/2007  . LOW BACK PAIN 08/16/2008  . MENOPAUSAL SYNDROME 04/20/2007  . PEPTIC ULCER DISEASE 09/06/2010  . Wears glasses      Social History   Social History  . Marital status: Widowed    Spouse name: N/A  . Number of children: 2  . Years of education: N/A   Occupational History  .  [employer]Cust [employer]  And  [employer]   Social History Main Topics  . Smoking status: Former Smoker    Quit date: 10/21/1978  . Smokeless tobacco: Never Used  . Alcohol use No  . Drug use: No  . Sexual activity: Not Currently   Other Topics Concern  . Not on file   Social History Narrative  . No narrative on file    Past Surgical History:  Procedure Laterality Date  . ABDOMINAL HYSTERECTOMY    . BREAST LUMPECTOMY WITH NEEDLE LOCALIZATION Left 10/07/2013   Procedure: BREAST LUMPECTOMY WITH NEEDLE LOCALIZATION EXCISION OF BILATERAL AXILLARY SKIN TAGS ;  Surgeon: Edward Jolly, MD;  Location: [facility];  Service: General;  Laterality: Left;  . BREAST SURGERY     30 rounds radiation  . CARPAL TUNNEL RELEASE Bilateral   . CHOLECYSTECTOMY    . COLONOSCOPY    . RE-EXCISION OF BREAST LUMPECTOMY Left 10/25/2013   Procedure: RE-EXCISION OF BREAST LUMPECTOMY;  Surgeon: Edward Jolly, MD;  Location: [place];  Service: General;  Laterality: Left;  clean wound class  . TUBAL LIGATION      Family History  Problem Relation Age of Onset  . Hypertension Mother   . Hyperlipidemia Mother   . Heart disease Mother        congestive heart failure  . Cancer Father        hx of melanoma  . Hypertension Sister   . Skin cancer Sister 58       basal cell carcinoma  . Hypertension Brother   . Heart disease Maternal Grandmother 77  . Prostate cancer Cousin   . Breast cancer Maternal Aunt 60  . Colon cancer Paternal Grandmother 14  . Heart disease Maternal Aunt 42    Allergies  Allergen Reactions  . Penicillins Anaphylaxis  . Erythromycin Nausea And Vomiting  . Adhesive [Tape]     rash  . Amoxicillin     REACTION: unspecified  . Shellfish Allergy     welts  . Sulfamethoxazole Hives    REACTION: unspecified    Current Outpatient Prescriptions on File Prior to Visit  Medication Sig Dispense Refill  . atorvastatin (LIPITOR) 40 MG tablet TAKE 1 TABLET DAILY 90 tablet 1  . Calcium Carbonate-Vitamin D (CALCIUM + D PO) Take 1 tablet by mouth 2 (  two) times daily before a meal.     . cetirizine (ZYRTEC) 10 MG tablet Take 10 mg by mouth daily as needed for allergies.    Marland Kitchen esomeprazole (NEXIUM) 40 MG capsule TAKE 1 CAPSULE DAILY 90 capsule 3  . hydrochlorothiazide (HYDRODIURIL) 25 MG tablet TAKE 1 TABLET DAILY 90 tablet 3  . letrozole (FEMARA) 2.5 MG tablet TAKE 1 TABLET DAILY 90 tablet 3  . Multiple Vitamins-Minerals (WOMENS MULTIVITAMIN PLUS PO) Take 1 tablet by mouth daily.     . pseudoephedrine (SUDAFED) 120 MG 12 hr tablet Take 120 mg by mouth 2 (two) times daily as needed.     . imiquimod (ALDARA) 5 % cream      No current facility-administered medications on file prior to visit.     BP 138/76 (BP Location: Left Arm, Patient Position: Sitting, Cuff Size: Normal)   Pulse 93   Temp 98.2 F (36.8 C) (Oral)   Ht 5\' 2"  (1.575 m)   Wt 174 lb 9.6 oz (79.2 kg)   SpO2 97%    BMI 31.93 kg/m     Review of Systems  Constitutional: Negative.  Negative for chills and fever.  HENT: Negative for congestion, dental problem, hearing loss, rhinorrhea, sinus pressure, sore throat and tinnitus.   Eyes: Negative for pain, discharge and visual disturbance.  Respiratory: Negative for cough and shortness of breath.   Cardiovascular: Negative for chest pain, palpitations and leg swelling.  Gastrointestinal: Negative for abdominal distention, abdominal pain, blood in stool, constipation, diarrhea, nausea and vomiting.  Genitourinary: Negative for difficulty urinating, dysuria, flank pain, frequency, hematuria, pelvic pain, urgency, vaginal bleeding, vaginal discharge and vaginal pain.  Musculoskeletal: Positive for neck pain. Negative for arthralgias, gait problem, joint swelling and neck stiffness.  Skin: Negative for rash.  Neurological: Negative for dizziness, syncope, speech difficulty, weakness, numbness and headaches.  Hematological: Negative for adenopathy.  Psychiatric/Behavioral: Negative for agitation, behavioral problems and dysphoric mood. The patient is not nervous/anxious.        Objective:   Physical Exam  Constitutional: She appears well-nourished. No distress.  Blood pressure 130/72  Neck:  Mild decrease range of motion assessed with head turning to the left Neck flexion aggravates the discomfort Posterior neck musculature as well as the soft tissues over the occipital scalp were tender          Assessment & Plan:   Neck pain secondary to cervical strain.  The patient will try gentle range of motion exercises and heat therapy.  If unimproved, will consider a cervical collar Will treat with ibuprofen.  Patient will call if unimproved  Nyoka Cowden

## 2017-05-27 NOTE — Assessment & Plan Note (Signed)
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 Breast exam 05/28/2017: Normal 2. Mammograms 09/20/2016: Normal. 2. Bone density 04/04/2015: Normal bone density  Patient and her sister take care of her ailing mother age 67 and requires 24/7 home care. This does not give her much time to do exercise. She will try to make some more time to get some activity. Provided info on healthy eating and wellness class.  Return to clinic in 1 year for follow-up

## 2017-05-28 ENCOUNTER — Ambulatory Visit (HOSPITAL_BASED_OUTPATIENT_CLINIC_OR_DEPARTMENT_OTHER): Payer: BLUE CROSS/BLUE SHIELD | Admitting: Hematology and Oncology

## 2017-05-28 ENCOUNTER — Encounter: Payer: Self-pay | Admitting: Hematology and Oncology

## 2017-05-28 DIAGNOSIS — Z170 Estrogen receptor positive status [ER+]: Secondary | ICD-10-CM | POA: Diagnosis not present

## 2017-05-28 DIAGNOSIS — Z79811 Long term (current) use of aromatase inhibitors: Secondary | ICD-10-CM | POA: Diagnosis not present

## 2017-05-28 DIAGNOSIS — C50512 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: Secondary | ICD-10-CM

## 2017-05-28 DIAGNOSIS — D0512 Intraductal carcinoma in situ of left breast: Secondary | ICD-10-CM

## 2017-05-28 MED ORDER — LETROZOLE 2.5 MG PO TABS: 2.5000 mg | ORAL_TABLET | Freq: Every day | ORAL | 3 refills | Status: DC

## 2017-05-28 NOTE — Progress Notes (Signed)
Patient Care Team: Marletta Lor, MD as PCP - General  DIAGNOSIS:  Encounter Diagnosis  Name Primary?  . Malignant neoplasm of lower-outer quadrant of left breast of female, estrogen receptor positive ([place])     SUMMARY OF ONCOLOGIC HISTORY:   Breast cancer of lower-outer quadrant of left female breast ([place])   10/07/2013 Surgery    Left breast lumpectomy: DCIS ER positive PR positive, reexcision of positive margins on 10/25/2013, final margins negative      11/08/2013 - 12/27/2013 Radiation Therapy    Adjuvant radiation therapy      01/03/2014 Procedure    Genetic testing negative for breast and ovarian cancer panels      01/24/2014 - 10/16/2014 Anti-estrogen oral therapy    Tamoxifen 20 mg daily 5 years      09/13/2014 Relapse/Recurrence    MRI of the breast revealed a 3.5 cm area of non-mass enhancement left breast lateral 3.5 x 1.9 x 1.6 cm, oval circumscribed T2 bright enhancing mass right breast upper outer quadrant 1 x 0.7 cm, linear non-mass enhancement 11 x 6 x 3 mm      10/07/2014 Surgery    Left breast lumpectomy: DCIS grade 10.6 cm ER 100%, PR 0%, multiple skin tags benign fibroepithelial polyps, reexcision 10/25/2013 benign fibrocystic changes with calcifications      12/27/2014 -  Anti-estrogen oral therapy    Anastrozole 1 mg daily changed to letrozole 2.5 mg daily 01/05/2017due to musculoskeletal aches and pains       CHIEF COMPLIANT: Follow-up on letrozole therapy  INTERVAL HISTORY: Katie Becker is a 66 year old with above-mentioned history of left breast cancer treated with lumpectomy followed adjuvant radiation. She was found to have a recurrence in December 2015 and underwent another lumpectomy and is currently on oral antiestrogen therapy with letrozole. She is tolerating letrozole much better than anastrozole. She continues to stay busy taking care of her ailing mother providing her 24/7 home care. She has multiple problems including the rotator  cuff issues and a tear in one of her muscles in her hip. She has been through multiple physical therapy treatments. She also has pain and discomfort in the left breast which is unchanged from before. Dr. Excell Seltzer apparently wants her to get an MRI next year. She has evidence of fat necrosis in the breast.  REVIEW OF SYSTEMS:   Constitutional: Denies fevers, chills or abnormal weight loss Eyes: Denies blurriness of vision Ears, nose, mouth, throat, and face: Denies mucositis or sore throat Respiratory: Denies cough, dyspnea or wheezes Cardiovascular: Denies palpitation, chest discomfort Gastrointestinal:  Denies nausea, heartburn or change in bowel habits Skin: Denies abnormal skin rashes Lymphatics: Denies new lymphadenopathy or easy bruising Neurological:Denies numbness, tingling or new weaknesses Behavioral/Psych: Mood is stable, no new changes  Extremities: No lower extremity edema Breast:  Intermittent pain in the left breast All other systems were reviewed with the patient and are negative.  I have reviewed the past medical history, past surgical history, social history and family history with the patient and they are unchanged from previous note.  ALLERGIES:  is allergic to penicillins; erythromycin; adhesive [tape]; amoxicillin; shellfish allergy; and sulfamethoxazole.  MEDICATIONS:  Current Outpatient Prescriptions  Medication Sig Dispense Refill  . atorvastatin (LIPITOR) 40 MG tablet TAKE 1 TABLET DAILY 90 tablet 1  . Calcium Carbonate-Vitamin D (CALCIUM + D PO) Take 1 tablet by mouth 2 (two) times daily before a meal.     . cetirizine (ZYRTEC) 10 MG tablet Take 10 mg  by mouth daily as needed for allergies.    Marland Kitchen esomeprazole (NEXIUM) 40 MG capsule TAKE 1 CAPSULE DAILY 90 capsule 3  . hydrochlorothiazide (HYDRODIURIL) 25 MG tablet TAKE 1 TABLET DAILY 90 tablet 3  . imiquimod (ALDARA) 5 % cream     . letrozole (FEMARA) 2.5 MG tablet TAKE 1 TABLET DAILY 90 tablet 3  . Multiple  Vitamins-Minerals (WOMENS MULTIVITAMIN PLUS PO) Take 1 tablet by mouth daily.     . pseudoephedrine (SUDAFED) 120 MG 12 hr tablet Take 120 mg by mouth 2 (two) times daily as needed.      No current facility-administered medications for this visit.     PHYSICAL EXAMINATION: ECOG PERFORMANCE STATUS: 1 - Symptomatic but completely ambulatory  Vitals:   05/28/17 1010  BP: (!) 142/61  Pulse: 67  Resp: 18  Temp: 97.8 F (36.6 C)   Filed Weights   05/28/17 1010  Weight: 172 lb 8 oz (78.2 kg)    GENERAL:alert, no distress and comfortable SKIN: skin color, texture, turgor are normal, no rashes or significant lesions EYES: normal, Conjunctiva are pink and non-injected, sclera clear OROPHARYNX:no exudate, no erythema and lips, buccal mucosa, and tongue normal  NECK: supple, thyroid normal size, non-tender, without nodularity LYMPH:  no palpable lymphadenopathy in the cervical, axillary or inguinal LUNGS: clear to auscultation and percussion with normal breathing effort HEART: regular rate & rhythm and no murmurs and no lower extremity edema ABDOMEN:abdomen soft, non-tender and normal bowel sounds MUSCULOSKELETAL:no cyanosis of digits and no clubbing  NEURO: alert & oriented x 3 with fluent speech, no focal motor/sensory deficits EXTREMITIES: No lower extremity edema  LABORATORY DATA:  I have reviewed the data as listed   Chemistry      Component Value Date/Time   NA 143 09/25/2016 0958   NA 143 07/14/2014 1104   K 3.5 09/25/2016 0958   K 3.4 (L) 07/14/2014 1104   CL 103 09/25/2016 0958   CO2 31 09/25/2016 0958   CO2 28 07/14/2014 1104   BUN 14 09/25/2016 0958   BUN 14.1 07/14/2014 1104   CREATININE 0.96 09/25/2016 0958   CREATININE 1.1 07/14/2014 1104      Component Value Date/Time   CALCIUM 9.8 09/25/2016 0958   CALCIUM 9.6 07/14/2014 1104   ALKPHOS 107 09/25/2016 0958   ALKPHOS 87 07/14/2014 1104   AST 28 09/25/2016 0958   AST 77 (H) 07/14/2014 1104   ALT 31  09/25/2016 0958   ALT 65 (H) 07/14/2014 1104   BILITOT 0.8 09/25/2016 0958   BILITOT 0.66 07/14/2014 1104       Lab Results  Component Value Date   WBC 7.0 09/25/2016   HGB 14.5 09/25/2016   HCT 43.0 09/25/2016   MCV 85.8 09/25/2016   PLT 253.0 09/25/2016   NEUTROABS 4.7 09/25/2016    ASSESSMENT & PLAN:  Breast cancer of lower-outer quadrant of left female breast 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 Breast exam 05/28/2017: Normal 2. Mammograms 09/20/2016: Normal. 2. Bone density 04/04/2015: Normal bone density  Patient and her sister take care of her ailing mother age 68 and requires 24/7 home care. This does  not give her much time to do exercise. She will try to make some more time to get some activity. Provided info on healthy eating and wellness class.  Return to clinic in 1 year for follow-up   I spent 25 minutes talking to the patient of which more than half was spent in counseling and coordination of care.  No orders of the defined types were placed in this encounter.  The patient has a good understanding of the overall plan. she agrees with it. she will call with any problems that may develop before the next visit here.   Rulon Eisenmenger, MD 05/28/17

## 2017-06-07 ENCOUNTER — Other Ambulatory Visit: Payer: Self-pay | Admitting: Internal Medicine

## 2017-07-09 ENCOUNTER — Ambulatory Visit (INDEPENDENT_AMBULATORY_CARE_PROVIDER_SITE_OTHER): Payer: BLUE CROSS/BLUE SHIELD | Admitting: Internal Medicine

## 2017-07-09 ENCOUNTER — Encounter: Payer: Self-pay | Admitting: Internal Medicine

## 2017-07-09 VITALS — BP 142/72 | HR 98 | Temp 98.2°F | Ht 62.0 in | Wt 178.4 lb

## 2017-07-09 DIAGNOSIS — K279 Peptic ulcer, site unspecified, unspecified as acute or chronic, without hemorrhage or perforation: Secondary | ICD-10-CM

## 2017-07-09 DIAGNOSIS — K219 Gastro-esophageal reflux disease without esophagitis: Secondary | ICD-10-CM

## 2017-07-09 DIAGNOSIS — R1013 Epigastric pain: Secondary | ICD-10-CM

## 2017-07-09 LAB — CBC WITH DIFFERENTIAL/PLATELET
BASOS ABS: 0 10*3/uL (ref 0.0–0.1)
Basophils Relative: 0.2 % (ref 0.0–3.0)
EOS ABS: 0.3 10*3/uL (ref 0.0–0.7)
Eosinophils Relative: 2.7 % (ref 0.0–5.0)
HCT: 43.1 % (ref 36.0–46.0)
Hemoglobin: 14.6 g/dL (ref 12.0–15.0)
LYMPHS ABS: 1.7 10*3/uL (ref 0.7–4.0)
Lymphocytes Relative: 18 % (ref 12.0–46.0)
MCHC: 33.8 g/dL (ref 30.0–36.0)
MCV: 87.1 fl (ref 78.0–100.0)
Monocytes Absolute: 0.6 10*3/uL (ref 0.1–1.0)
Monocytes Relative: 6.1 % (ref 3.0–12.0)
NEUTROS ABS: 7.1 10*3/uL (ref 1.4–7.7)
NEUTROS PCT: 73 % (ref 43.0–77.0)
PLATELETS: 217 10*3/uL (ref 150.0–400.0)
RBC: 4.94 Mil/uL (ref 3.87–5.11)
RDW: 13.2 % (ref 11.5–15.5)
WBC: 9.7 10*3/uL (ref 4.0–10.5)

## 2017-07-09 LAB — H. PYLORI ANTIBODY, IGG: H Pylori IgG: NEGATIVE

## 2017-07-09 NOTE — Patient Instructions (Signed)
Avoids foods high in acid such as tomatoes citrus juices, and spicy foods.  Avoid eating within two hours of lying down or before exercising.  Do not overheat.  Try smaller more frequent meals.  If symptoms persist or worsen, please notify the office  Take an antacid 4 times daily, such as Maalox  Call or return to clinic prn if these symptoms worsen or fail to improve as anticipated.

## 2017-07-09 NOTE — Progress Notes (Signed)
Subjective:    Patient ID: Katie Becker, female    DOB: October 17, 1951, 66 y.o.   MRN: [id number]  HPI  66 year old patient who has remote history of peptic ulcer disease.  She states that in the late eighties.  She underwent endoscopy for bleeding peptic ulcer disease.  She remembers that there was a discussion whether to treat with Tagamet or Zantac which was a newer product on the market at that time. Last night.  She states that her cat jumped and landed on her epigastric area.  Apparently, this causes no pain at that time and she was concerned about local trauma.  As the cause of her discomfort She woke at 7:30 AM today with epigastric pain.  She has had a prior cholecystectomy.  Pain is fairly continuous and aggravated by pressure.  No nausea, vomiting or change in her bowel habits.  No fever  Past Medical History:  Diagnosis Date  . Arthritis    back  . Breast cancer ([place])    Left Breast - ductal Carcinoma In-Situ - diagnosed on MRI- biopsy  . DIVERTICULOSIS, COLON 04/20/2007  . GERD 04/20/2007  . H/O hiatal hernia   . Heart murmur    told by PCP- years ago-never had echo  . HEPATOMEGALY, HX OF 12/21/2009  . HYPERLIPIDEMIA 04/20/2007  . HYPERTENSION 04/20/2007  . LOW BACK PAIN 08/16/2008  . MENOPAUSAL SYNDROME 04/20/2007  . PEPTIC ULCER DISEASE 09/06/2010  . Wears glasses      Social History   Social History  . Marital status: Widowed    Spouse name: N/A  . Number of children: 2  . Years of education: N/A   Occupational History  .  [employer]Cust Serv  And  [employer]   Social History Main Topics  . Smoking status: Former Smoker    Quit date: 10/21/1978  . Smokeless tobacco: Never Used  . Alcohol use No  . Drug use: No  . Sexual activity: Not Currently   Other Topics Concern  . Not on file   Social History Narrative  . No narrative on file    Past Surgical History:  Procedure Laterality Date  . ABDOMINAL HYSTERECTOMY    . BREAST LUMPECTOMY WITH NEEDLE  LOCALIZATION Left 10/07/2013   Procedure: BREAST LUMPECTOMY WITH NEEDLE LOCALIZATION EXCISION OF BILATERAL AXILLARY SKIN TAGS ;  Surgeon: Edward Jolly, MD;  Location: [facility];  Service: General;  Laterality: Left;  . BREAST SURGERY     30 rounds radiation  . CARPAL TUNNEL RELEASE Bilateral   . CHOLECYSTECTOMY    . COLONOSCOPY    . RE-EXCISION OF BREAST LUMPECTOMY Left 10/25/2013   Procedure: RE-EXCISION OF BREAST LUMPECTOMY;  Surgeon: Edward Jolly, MD;  Location: [facility];  Service: General;  Laterality: Left;  clean wound class  . TUBAL LIGATION      Family History  Problem Relation Age of Onset  . Hypertension Mother   . Hyperlipidemia Mother   . Heart disease Mother        congestive heart failure  . Cancer Father        hx of melanoma  . Hypertension Sister   . Skin cancer Sister 65       basal cell carcinoma  . Hypertension Brother   . Heart disease Maternal Grandmother 12  . Breast cancer Maternal Aunt 60  . Colon cancer Paternal Grandmother 60  . Heart disease Maternal Aunt 42  . Prostate cancer Cousin  Allergies  Allergen Reactions  . Penicillins Anaphylaxis  . Erythromycin Nausea And Vomiting  . Adhesive [Tape]     rash  . Amoxicillin     REACTION: unspecified  . Shellfish Allergy     welts  . Sulfamethoxazole Hives    REACTION: unspecified    Current Outpatient Prescriptions on File Prior to Visit  Medication Sig Dispense Refill  . atorvastatin (LIPITOR) 40 MG tablet TAKE 1 TABLET DAILY 90 tablet 1  . Calcium Carbonate-Vitamin D (CALCIUM + D PO) Take 1 tablet by mouth 2 (two) times daily before a meal.     . cetirizine (ZYRTEC) 10 MG tablet Take 10 mg by mouth daily as needed for allergies.    Marland Kitchen esomeprazole (NEXIUM) 40 MG capsule TAKE 1 CAPSULE DAILY 90 capsule 3  . hydrochlorothiazide (HYDRODIURIL) 25 MG tablet TAKE 1 TABLET DAILY 90 tablet 3  . letrozole (FEMARA) 2.5 MG tablet Take 1 tablet (2.5 mg total) by mouth daily.  90 tablet 3  . Multiple Vitamins-Minerals (WOMENS MULTIVITAMIN PLUS PO) Take 1 tablet by mouth daily.     . pseudoephedrine (SUDAFED) 120 MG 12 hr tablet Take 120 mg by mouth 2 (two) times daily as needed.      No current facility-administered medications on file prior to visit.     BP (!) 142/72 (BP Location: Left Arm, Patient Position: Sitting, Cuff Size: Normal)   Pulse 98   Temp 98.2 F (36.8 C) (Oral)   Ht 5\' 2"  (1.575 m)   Wt 178 lb 6.4 oz (80.9 kg)   SpO2 94%   BMI 32.63 kg/m     Review of Systems  Constitutional: Negative.   HENT: Negative for congestion, dental problem, hearing loss, rhinorrhea, sinus pressure, sore throat and tinnitus.   Eyes: Negative for pain, discharge and visual disturbance.  Respiratory: Negative for cough and shortness of breath.   Cardiovascular: Negative for chest pain, palpitations and leg swelling.  Gastrointestinal: Positive for abdominal pain. Negative for abdominal distention, blood in stool, constipation, diarrhea, nausea and vomiting.  Genitourinary: Negative for difficulty urinating, dysuria, flank pain, frequency, hematuria, pelvic pain, urgency, vaginal bleeding, vaginal discharge and vaginal pain.  Musculoskeletal: Negative for arthralgias, gait problem and joint swelling.  Skin: Negative for rash.  Neurological: Negative for dizziness, syncope, speech difficulty, weakness, numbness and headaches.  Hematological: Negative for adenopathy.  Psychiatric/Behavioral: Negative for agitation, behavioral problems and dysphoric mood. The patient is not nervous/anxious.        Objective:   Physical Exam  Constitutional: She is oriented to person, place, and time. She appears well-developed and well-nourished.  HENT:  Head: Normocephalic.  Right Ear: External ear normal.  Left Ear: External ear normal.  Mouth/Throat: Oropharynx is clear and moist.  Eyes: Pupils are equal, round, and reactive to light. Conjunctivae and EOM are normal.    Neck: Normal range of motion. Neck supple. No thyromegaly present.  Cardiovascular: Normal rate, regular rhythm, normal heart sounds and intact distal pulses.   Pulmonary/Chest: Effort normal and breath sounds normal.  Abdominal: Soft. Bowel sounds are normal. She exhibits no mass. There is tenderness.  Epigastric discomfort without rebound.  Bowel sounds active  Musculoskeletal: Normal range of motion.  Lymphadenopathy:    She has no cervical adenopathy.  Neurological: She is alert and oriented to person, place, and time.  Skin: Skin is warm and dry. No rash noted.  Psychiatric: She has a normal mood and affect. Her behavior is normal.  Assessment & Plan:   Epigastric pain History of peptic ulcer disease History of GERD Chronic PPI therapy  We'll check H. Pylori antibody.  If this is positive, suggesting remote or present H pylori infection and the patient remains symptomatic, we'll consider treating for H. Pylori infection.  Presently will add antacid therapy 4 times daily and continue PPI therapy.  Will reassess her status in 48 hours and review blood work.  Nyoka Cowden

## 2017-07-10 ENCOUNTER — Encounter: Payer: Self-pay | Admitting: Internal Medicine

## 2017-07-11 ENCOUNTER — Ambulatory Visit (INDEPENDENT_AMBULATORY_CARE_PROVIDER_SITE_OTHER): Payer: BLUE CROSS/BLUE SHIELD | Admitting: Internal Medicine

## 2017-07-11 ENCOUNTER — Encounter: Payer: Self-pay | Admitting: Internal Medicine

## 2017-07-11 VITALS — BP 132/76 | HR 81 | Temp 98.3°F | Ht 62.0 in | Wt 177.0 lb

## 2017-07-11 DIAGNOSIS — K279 Peptic ulcer, site unspecified, unspecified as acute or chronic, without hemorrhage or perforation: Secondary | ICD-10-CM

## 2017-07-11 DIAGNOSIS — R1013 Epigastric pain: Secondary | ICD-10-CM | POA: Diagnosis not present

## 2017-07-11 MED ORDER — SUCRALFATE 1 G PO TABS: 1.0000 g | ORAL_TABLET | Freq: Three times a day (TID) | ORAL | 1 refills | Status: DC

## 2017-07-11 NOTE — Patient Instructions (Signed)
Avoids foods high in acid such as tomatoes citrus juices, and spicy foods.  Avoid eating within two hours of lying down or before exercising.  Do not overheat.  Try smaller more frequent meals.  If symptoms persist,  Call the office in 3 days to schedule upper endoscopy

## 2017-07-11 NOTE — Progress Notes (Signed)
Subjective:    Patient ID: Katie Becker, female    DOB: April 28, 1951, 66 y.o.   MRN: [id number]  HPI  66 year old patient who is seen today in follow-up.  She was seen 2 days ago with the onset of epigastric pain earlier that morning.  She does have a remote history of documented peptic ulcer disease (" 3 bleeding ulcers") a number of years ago.  She has a history of GERD and has been on chronic PPI therapy. H. Pylori antibody testing was negative.  2 days ago.  Antacid therapy was added to her regimen.  She seemed to improve yesterday, but today has persistent epigastric pain, which is tender to palpation.  Has some occasional nausea but no vomiting or change in her bowel habits  Past Medical History:  Diagnosis Date  . Arthritis    back  . Breast cancer ([place])    Left Breast - ductal Carcinoma In-Situ - diagnosed on MRI- biopsy  . DIVERTICULOSIS, COLON 04/20/2007  . GERD 04/20/2007  . H/O hiatal hernia   . Heart murmur    told by PCP- years ago-never had echo  . HEPATOMEGALY, HX OF 12/21/2009  . HYPERLIPIDEMIA 04/20/2007  . HYPERTENSION 04/20/2007  . LOW BACK PAIN 08/16/2008  . MENOPAUSAL SYNDROME 04/20/2007  . PEPTIC ULCER DISEASE 09/06/2010  . Wears glasses      Social History   Social History  . Marital status: Widowed    Spouse name: N/A  . Number of children: 2  . Years of education: N/A   Occupational History  .  [employer]Cust [employer]  And  [employer]   Social History Main Topics  . Smoking status: Former Smoker    Quit date: 10/21/1978  . Smokeless tobacco: Never Used  . Alcohol use No  . Drug use: No  . Sexual activity: Not Currently   Other Topics Concern  . Not on file   Social History Narrative  . No narrative on file    Past Surgical History:  Procedure Laterality Date  . ABDOMINAL HYSTERECTOMY    . BREAST LUMPECTOMY WITH NEEDLE LOCALIZATION Left 10/07/2013   Procedure: BREAST LUMPECTOMY WITH NEEDLE LOCALIZATION EXCISION OF BILATERAL AXILLARY SKIN TAGS ;   Surgeon: Edward Jolly, MD;  Location: [facility];  Service: General;  Laterality: Left;  . BREAST SURGERY     30 rounds radiation  . CARPAL TUNNEL RELEASE Bilateral   . CHOLECYSTECTOMY    . COLONOSCOPY    . RE-EXCISION OF BREAST LUMPECTOMY Left 10/25/2013   Procedure: RE-EXCISION OF BREAST LUMPECTOMY;  Surgeon: Edward Jolly, MD;  Location: [facility];  Service: General;  Laterality: Left;  clean wound class  . TUBAL LIGATION      Family History  Problem Relation Age of Onset  . Hypertension Mother   . Hyperlipidemia Mother   . Heart disease Mother        congestive heart failure  . Cancer Father        hx of melanoma  . Hypertension Sister   . Skin cancer Sister 36       basal cell carcinoma  . Hypertension Brother   . Heart disease Maternal Grandmother 54  . Breast cancer Maternal Aunt 60  . Colon cancer Paternal Grandmother 3  . Heart disease Maternal Aunt 42  . Prostate cancer Cousin     Allergies  Allergen Reactions  . Penicillins Anaphylaxis  . Erythromycin Nausea And Vomiting  . Adhesive [Tape]  rash  . Amoxicillin     REACTION: unspecified  . Shellfish Allergy     welts  . Sulfamethoxazole Hives    REACTION: unspecified    Current Outpatient Prescriptions on File Prior to Visit  Medication Sig Dispense Refill  . atorvastatin (LIPITOR) 40 MG tablet TAKE 1 TABLET DAILY 90 tablet 1  . Calcium Carbonate-Vitamin D (CALCIUM + D PO) Take 1 tablet by mouth 2 (two) times daily before a meal.     . cetirizine (ZYRTEC) 10 MG tablet Take 10 mg by mouth daily as needed for allergies.    Marland Kitchen esomeprazole (NEXIUM) 40 MG capsule TAKE 1 CAPSULE DAILY 90 capsule 3  . hydrochlorothiazide (HYDRODIURIL) 25 MG tablet TAKE 1 TABLET DAILY 90 tablet 3  . letrozole (FEMARA) 2.5 MG tablet Take 1 tablet (2.5 mg total) by mouth daily. 90 tablet 3  . Multiple Vitamins-Minerals (WOMENS MULTIVITAMIN PLUS PO) Take 1 tablet by mouth daily.     . pseudoephedrine  (SUDAFED) 120 MG 12 hr tablet Take 120 mg by mouth 2 (two) times daily as needed.      No current facility-administered medications on file prior to visit.     BP 132/76 (BP Location: Left Arm, Patient Position: Sitting, Cuff Size: Normal)   Pulse 81   Temp 98.3 F (36.8 C) (Oral)   Ht 5\' 2"  (1.575 m)   Wt 177 lb (80.3 kg)   SpO2 95%   BMI 32.37 kg/m     Review of Systems  Constitutional: Negative.   HENT: Negative for congestion, dental problem, hearing loss, rhinorrhea, sinus pressure, sore throat and tinnitus.   Eyes: Negative for pain, discharge and visual disturbance.  Respiratory: Negative for cough and shortness of breath.   Cardiovascular: Negative for chest pain, palpitations and leg swelling.  Gastrointestinal: Positive for abdominal pain. Negative for abdominal distention, blood in stool, constipation, diarrhea, nausea and vomiting.  Genitourinary: Negative for difficulty urinating, dysuria, flank pain, frequency, hematuria, pelvic pain, urgency, vaginal bleeding, vaginal discharge and vaginal pain.  Musculoskeletal: Negative for arthralgias, gait problem and joint swelling.  Skin: Negative for rash.  Neurological: Negative for dizziness, syncope, speech difficulty, weakness, numbness and headaches.  Hematological: Negative for adenopathy.  Psychiatric/Behavioral: Negative for agitation, behavioral problems and dysphoric mood. The patient is not nervous/anxious.        Objective:   Physical Exam  Constitutional: She is oriented to person, place, and time. She appears well-developed and well-nourished. No distress.  HENT:  Head: Normocephalic.  Right Ear: External ear normal.  Left Ear: External ear normal.  Mouth/Throat: Oropharynx is clear and moist.  Eyes: Pupils are equal, round, and reactive to light. Conjunctivae and EOM are normal.  Neck: Normal range of motion. Neck supple. No thyromegaly present.  Cardiovascular: Normal rate, regular rhythm, normal heart  sounds and intact distal pulses.   Pulmonary/Chest: Effort normal and breath sounds normal.  Abdominal: Soft. Bowel sounds are normal. She exhibits no mass. There is tenderness.  Bowel sounds are active Epigastric tenderness to palpation No guarding  Sitting from a supine position.  Does not aggravate the discomfort  Musculoskeletal: Normal range of motion.  Lymphadenopathy:    She has no cervical adenopathy.  Neurological: She is alert and oriented to person, place, and time.  Skin: Skin is warm and dry. No rash noted.  Psychiatric: She has a normal mood and affect. Her behavior is normal.          Assessment & Plan:   Persistent epigastric  pain.  History of peptic ulcer disease.  Will add Carafate 1 g by mouth 4 times daily.  If pain persists through the weekend, the patient will call the office Monday morning and will set up for upper endoscopy. She will report any worsening pain, vomiting or abdominal distention  Nyoka Cowden

## 2017-07-13 ENCOUNTER — Encounter: Payer: Self-pay | Admitting: Internal Medicine

## 2017-07-14 NOTE — Telephone Encounter (Signed)
Please advise Dr Raliegh Ip. Thanks.  See e-mail attached.

## 2017-07-17 ENCOUNTER — Encounter: Payer: Self-pay | Admitting: Internal Medicine

## 2017-07-21 ENCOUNTER — Ambulatory Visit (INDEPENDENT_AMBULATORY_CARE_PROVIDER_SITE_OTHER): Payer: BLUE CROSS/BLUE SHIELD | Admitting: Internal Medicine

## 2017-07-21 ENCOUNTER — Encounter: Payer: Self-pay | Admitting: Internal Medicine

## 2017-07-21 VITALS — BP 112/62 | HR 74 | Temp 98.2°F | Ht 62.0 in | Wt 177.0 lb

## 2017-07-21 DIAGNOSIS — Z23 Encounter for immunization: Secondary | ICD-10-CM

## 2017-07-21 DIAGNOSIS — K279 Peptic ulcer, site unspecified, unspecified as acute or chronic, without hemorrhage or perforation: Secondary | ICD-10-CM | POA: Diagnosis not present

## 2017-07-21 DIAGNOSIS — I10 Essential (primary) hypertension: Secondary | ICD-10-CM

## 2017-07-21 DIAGNOSIS — R300 Dysuria: Secondary | ICD-10-CM

## 2017-07-21 LAB — POC URINALSYSI DIPSTICK (AUTOMATED)
BILIRUBIN UA: NEGATIVE
GLUCOSE UA: NEGATIVE
Ketones, UA: NEGATIVE
Nitrite, UA: NEGATIVE
PH UA: 6 (ref 5.0–8.0)
Protein, UA: NEGATIVE
RBC UA: NEGATIVE
SPEC GRAV UA: 1.015 (ref 1.010–1.025)
UROBILINOGEN UA: 0.2 U/dL

## 2017-07-21 MED ORDER — CIPROFLOXACIN HCL 500 MG PO TABS: 500.0000 mg | ORAL_TABLET | Freq: Two times a day (BID) | ORAL | 0 refills | Status: DC

## 2017-07-21 NOTE — Progress Notes (Signed)
Subjective:    Patient ID: Katie Becker, female    DOB: [birth year], 66 y.o.   MRN: [id number]  HPI  66 year old patient who presents with a three-day history of lower abdominal discomfort and burning dysuria.  She has had some urgency but no real frequency.  She has had UTIs in the past She has been seen recently for epigastric pain that has resolved.  Past Medical History:  Diagnosis Date  . Arthritis    back  . 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 DIVERTICULOSIS, COLON 04/20/2007  . GERD 04/20/2007  . H/O hiatal hernia   . Heart murmur    told by PCP- years ago-never had echo  . HEPATOMEGALY, HX OF 12/21/2009  . HYPERLIPIDEMIA 04/20/2007  . HYPERTENSION 04/20/2007  . LOW BACK PAIN 08/16/2008  . MENOPAUSAL SYNDROME 04/20/2007  . PEPTIC ULCER DISEASE 09/06/2010  . Wears glasses      Social History   Social History  . Marital status: Widowed    Spouse name: N/A  . Number of children: 2  . Years of education: N/A   Occupational History  .  [employer]ust [employer]  And  [employer]   Social History Main Topics  . Smoking status: Former Smoker    Quit date: 10/21/1978  . Smokeless tobacco: Never Used  . Alcohol use No  . Drug use: No  . Sexual activity: Not Currently   Other Topics Concern  . Not on file   Social History Narrative  . No narrative on file    Past Surgical History:  Procedure Laterality Date  . ABDOMINAL HYSTERECTOMY    . BREAST LUMPECTOMY WITH NEEDLE LOCALIZATION Left 10/07/2013   Procedure: BREAST LUMPECTOMY WITH NEEDLE LOCALIZATION EXCISION OF BILATERAL AXILLARY SKIN TAGS ;  Surgeon: Edward Jolly, MD;  Location: [facility];  Service: General;  Laterality: Left;  . BREAST SURGERY     30 rounds radiation  . CARPAL TUNNEL RELEASE Bilateral   . CHOLECYSTECTOMY    . COLONOSCOPY    . RE-EXCISION OF BREAST LUMPECTOMY Left 10/25/2013   Procedure: RE-EXCISION OF BREAST LUMPECTOMY;  Surgeon: Edward Jolly, MD;  Location: [facility];  Service: General;  Laterality: Left;  clean wound class  . TUBAL LIGATION      Family History  Problem Relation Age of Onset  . Hypertension Mother   . Hyperlipidemia Mother   . Heart disease Mother        congestive heart failure  . Cancer Father        hx of melanoma  . Hypertension Sister   . Skin cancer Sister 79       basal cell carcinoma  . Hypertension Brother   . Heart disease Maternal Grandmother 46  . Breast cancer Maternal Aunt 60  . Colon cancer Paternal Grandmother 61  . Heart disease Maternal Aunt 42  . Prostate cancer Cousin     Allergies  Allergen Reactions  . Penicillins Anaphylaxis  . Erythromycin Nausea And Vomiting  . Adhesive [Tape]     rash  . Amoxicillin     REACTION: unspecified  . Shellfish Allergy     welts  . Sulfamethoxazole Hives    REACTION: unspecified    Current Outpatient Prescriptions on File Prior to Visit  Medication Sig Dispense Refill  . atorvastatin (LIPITOR) 40 MG tablet TAKE 1 TABLET DAILY 90 tablet 1  . Calcium Carbonate-Vitamin  D (CALCIUM + D PO) Take 1 tablet by mouth 2 (two) times daily before a meal.     . cetirizine (ZYRTEC) 10 MG tablet Take 10 mg by mouth daily as needed for allergies.    Marland Kitchen esomeprazole (NEXIUM) 40 MG capsule TAKE 1 CAPSULE DAILY 90 capsule 3  . hydrochlorothiazide (HYDRODIURIL) 25 MG tablet TAKE 1 TABLET DAILY 90 tablet 3  . letrozole (FEMARA) 2.5 MG tablet Take 1 tablet (2.5 mg total) by mouth daily. 90 tablet 3  . Multiple Vitamins-Minerals (WOMENS MULTIVITAMIN PLUS PO) Take 1 tablet by mouth daily.     . pseudoephedrine (SUDAFED) 120 MG 12 hr tablet Take 120 mg by mouth 2 (two) times daily as needed.     . sucralfate (CARAFATE) 1 g tablet Take 1 tablet (1 g total) by mouth 4 (four) times daily -  with meals and at bedtime. 40 tablet 1   No current facility-administered medications on file prior to visit.     BP 112/62 (BP Location: Left Arm,  Patient Position: Sitting, Cuff Size: Normal)   Pulse 74   Temp 98.2 F (36.8 C) (Oral)   Ht 5\' 2"  (1.575 m)   Wt 177 lb (80.3 kg)   SpO2 96%   BMI 32.37 kg/m     Review of Systems  Constitutional: Negative.   HENT: Negative for congestion, dental problem, hearing loss, rhinorrhea, sinus pressure, sore throat and tinnitus.   Eyes: Negative for pain, discharge and visual disturbance.  Respiratory: Negative for cough and shortness of breath.   Cardiovascular: Negative for chest pain, palpitations and leg swelling.  Gastrointestinal: Negative for abdominal distention, abdominal pain, blood in stool, constipation, diarrhea, nausea and vomiting.  Genitourinary: Positive for dysuria and urgency. Negative for difficulty urinating, flank pain, frequency, hematuria, pelvic pain, vaginal bleeding, vaginal discharge and vaginal pain.  Musculoskeletal: Negative for arthralgias, gait problem and joint swelling.  Skin: Negative for rash.  Neurological: Negative for dizziness, syncope, speech difficulty, weakness, numbness and headaches.  Hematological: Negative for adenopathy.  Psychiatric/Behavioral: Negative for agitation, behavioral problems and dysphoric mood. The patient is not nervous/anxious.        Objective:   Physical Exam  Constitutional: She appears well-developed. No distress.  Abdominal: Soft. Bowel sounds are normal. She exhibits no distension. There is tenderness.  Mild suprapubic tenderness          Assessment & Plan:   UTI.  Will chew with Cipro for 3 days ( allergies to amoxicillin and sulfa) Epigastric pain.  Resolved  Katie Becker

## 2017-07-21 NOTE — Patient Instructions (Addendum)
Drink as much fluid as you  can tolerate over the next few days  Take your antibiotic as prescribed until ALL of it is gone, but stop if you develop a rash, swelling, or any side effects of the medication.  Contact our office as soon as possible if  there are side effects of the medication.  Follow these instructions at home:   If you were prescribed an antibiotic medicine, take it as told by your doctor. Do not stop taking the antibiotic even if you start to feel better.  Avoid the following drinks: ? Alcohol. ? Caffeine. ? Tea. ? Carbonated drinks.  Drink enough fluid to keep your pee clear or pale yellow.  Keep all follow-up visits as told by your doctor. This is important.  Make sure to: ? Empty your bladder often and completely. Do not to hold pee for long periods of time. ? Empty your bladder before and after sex. ? Wipe from front to back after a bowel movement if you are female. Use each tissue one time when you wipe.

## 2017-08-07 ENCOUNTER — Other Ambulatory Visit: Payer: Self-pay | Admitting: Hematology and Oncology

## 2017-08-07 DIAGNOSIS — Z853 Personal history of malignant neoplasm of breast: Secondary | ICD-10-CM

## 2017-08-17 ENCOUNTER — Other Ambulatory Visit: Payer: Self-pay | Admitting: Hematology and Oncology

## 2017-08-17 DIAGNOSIS — C50512 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: Secondary | ICD-10-CM

## 2017-08-17 DIAGNOSIS — Z170 Estrogen receptor positive status [ER+]: Principal | ICD-10-CM

## 2017-09-04 ENCOUNTER — Encounter: Payer: Self-pay | Admitting: Internal Medicine

## 2017-09-04 ENCOUNTER — Other Ambulatory Visit: Payer: Self-pay | Admitting: General Surgery

## 2017-09-04 DIAGNOSIS — D0512 Intraductal carcinoma in situ of left breast: Secondary | ICD-10-CM

## 2017-09-05 ENCOUNTER — Encounter: Payer: Self-pay | Admitting: Internal Medicine

## 2017-09-14 ENCOUNTER — Encounter: Payer: Self-pay | Admitting: Internal Medicine

## 2017-09-22 ENCOUNTER — Ambulatory Visit
Admission: RE | Admit: 2017-09-22 | Discharge: 2017-09-22 | Disposition: A | Discharge status: Home / Self Care | Payer: BLUE CROSS/BLUE SHIELD | Source: Ambulatory Visit | Attending: Hematology and Oncology | Admitting: Hematology and Oncology

## 2017-09-22 DIAGNOSIS — Z853 Personal history of malignant neoplasm of breast: Secondary | ICD-10-CM

## 2017-09-22 HISTORY — DX: Personal history of irradiation: Z92.3

## 2017-09-22 HISTORY — DX: Personal history of antineoplastic chemotherapy: Z92.21

## 2017-09-29 ENCOUNTER — Other Ambulatory Visit: Payer: BLUE CROSS/BLUE SHIELD

## 2017-10-02 ENCOUNTER — Ambulatory Visit
Admission: RE | Admit: 2017-10-02 | Discharge: 2017-10-02 | Disposition: A | Discharge status: Home / Self Care | Payer: BLUE CROSS/BLUE SHIELD | Source: Ambulatory Visit | Attending: General Surgery | Admitting: General Surgery

## 2017-10-02 DIAGNOSIS — D0512 Intraductal carcinoma in situ of left breast: Secondary | ICD-10-CM

## 2017-10-02 MED ORDER — GADOBENATE DIMEGLUMINE 529 MG/ML IV SOLN
16.0000 mL | Freq: Once | INTRAVENOUS | Status: AC | PRN
  Administered 2017-10-02: 16 mL via INTRAVENOUS

## 2017-10-06 ENCOUNTER — Encounter: Payer: Self-pay | Admitting: Internal Medicine

## 2017-10-06 ENCOUNTER — Ambulatory Visit (INDEPENDENT_AMBULATORY_CARE_PROVIDER_SITE_OTHER): Payer: BLUE CROSS/BLUE SHIELD | Admitting: Internal Medicine

## 2017-10-06 VITALS — BP 122/78 | HR 72 | Temp 97.6°F | Ht 62.0 in | Wt 179.4 lb

## 2017-10-06 DIAGNOSIS — Z0000 Encounter for general adult medical examination without abnormal findings: Secondary | ICD-10-CM

## 2017-10-06 LAB — COMPREHENSIVE METABOLIC PANEL
ALK PHOS: 100 U/L (ref 39–117)
ALT: 50 U/L — AB (ref 0–35)
AST: 43 U/L — ABNORMAL HIGH (ref 0–37)
Albumin: 4.6 g/dL (ref 3.5–5.2)
BUN: 13 mg/dL (ref 6–23)
CO2: 33 meq/L — AB (ref 19–32)
Calcium: 10.2 mg/dL (ref 8.4–10.5)
Chloride: 99 mEq/L (ref 96–112)
Creatinine, Ser: 0.93 mg/dL (ref 0.40–1.20)
GFR: 64.38 mL/min (ref >60.00)
GLUCOSE: 108 mg/dL — AB (ref 70–99)
Potassium: 3.8 mEq/L (ref 3.5–5.1)
SODIUM: 141 meq/L (ref 135–145)
TOTAL PROTEIN: 7.3 g/dL (ref 6.0–8.3)
Total Bilirubin: 0.8 mg/dL (ref 0.2–1.2)

## 2017-10-06 LAB — CBC WITH DIFFERENTIAL/PLATELET
Basophils Absolute: 0 10*3/uL (ref 0.0–0.1)
Basophils Relative: 0.7 % (ref 0.0–3.0)
EOS PCT: 4 % (ref 0.0–5.0)
Eosinophils Absolute: 0.3 10*3/uL (ref 0.0–0.7)
HCT: 45 % (ref 36.0–46.0)
Hemoglobin: 14.9 g/dL (ref 12.0–15.0)
LYMPHS ABS: 1.9 10*3/uL (ref 0.7–4.0)
Lymphocytes Relative: 27.2 % (ref 12.0–46.0)
MCHC: 33.2 g/dL (ref 30.0–36.0)
MCV: 87 fl (ref 78.0–100.0)
MONO ABS: 0.3 10*3/uL (ref 0.1–1.0)
MONOS PCT: 4.7 % (ref 3.0–12.0)
NEUTROS ABS: 4.4 10*3/uL (ref 1.4–7.7)
NEUTROS PCT: 63.4 % (ref 43.0–77.0)
PLATELETS: 232 10*3/uL (ref 150.0–400.0)
RBC: 5.18 Mil/uL — ABNORMAL HIGH (ref 3.87–5.11)
RDW: 12.7 % (ref 11.5–15.5)
WBC: 7 10*3/uL (ref 4.0–10.5)

## 2017-10-06 LAB — TSH: TSH: 1.25 u[IU]/mL (ref 0.35–4.50)

## 2017-10-06 LAB — LIPID PANEL
CHOL/HDL RATIO: 3
Cholesterol: 182 mg/dL (ref 0–200)
HDL: 52.6 mg/dL (ref >39.00)
NONHDL: 129.2
TRIGLYCERIDES: 218 mg/dL — AB (ref 0.0–149.0)
VLDL: 43.6 mg/dL — ABNORMAL HIGH (ref 0.0–40.0)

## 2017-10-06 LAB — LDL CHOLESTEROL, DIRECT: Direct LDL: 126 mg/dL

## 2017-10-06 NOTE — Progress Notes (Deleted)
   Subjective:    Patient ID: Katie Becker, female    DOB: 1951/03/10, 66 y.o.   MRN: [id number]  HPI    Review of Systems     Objective:   Physical Exam        Assessment & Plan:

## 2017-10-06 NOTE — Progress Notes (Signed)
Subjective:    Patient ID: Katie Becker, female    DOB: January 01, 1951, 66 y.o.   MRN: [id number]  HPI 66 year old patient who is seen today for a preventive health examination She has a history of left breast cancer and is followed by oncology as well as general surgery.  She has had a recent breast MRI She has dyslipidemia and remains on statin therapy.  She has essential hypertension which has been well controlled.  She has chronic low back pain which has been stable.  Past Medical History:  Diagnosis Date  . Arthritis    back  . 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 DIVERTICULOSIS, COLON 04/20/2007  . GERD 04/20/2007  . H/O hiatal hernia   . Heart murmur    told by PCP- years ago-never had echo  . HEPATOMEGALY, HX OF 12/21/2009  . HYPERLIPIDEMIA 04/20/2007  . HYPERTENSION 04/20/2007  . LOW BACK PAIN 08/16/2008  . MENOPAUSAL SYNDROME 04/20/2007  . PEPTIC ULCER DISEASE 09/06/2010  . Personal history of chemotherapy    2015  . Personal history of radiation therapy    2015  . Wears glasses      Social History   Socioeconomic History  . Marital status: Widowed    Spouse name: Not on file  . Number of children: 2  . Years of education: Not on file  . Highest education level: Not on file  Social Needs  . Financial resource strain: Not on file  . Food insecurity - worry: Not on file  . Food insecurity - inability: Not on file  . Transportation needs - medical: Not on file  . Transportation needs - non-medical: Not on file  Occupational History    Employer: [employer]  Tobacco Use  . Smoking status: Former Smoker    Last attempt to quit: 10/21/1978    Years since quitting: 38.9  . Smokeless tobacco: Never Used  Substance and Sexual Activity  . Alcohol use: No  . Drug use: No  . Sexual activity: Not Currently  Other Topics Concern  . Not on file  Social History Narrative  . Not on file     Past Surgical History:  Procedure Laterality Date  . ABDOMINAL HYSTERECTOMY    . BREAST LUMPECTOMY Left    10/07/13 & 10/25/13  . BREAST LUMPECTOMY WITH NEEDLE LOCALIZATION Left 10/07/2013   Procedure: BREAST LUMPECTOMY WITH NEEDLE LOCALIZATION EXCISION OF BILATERAL AXILLARY SKIN TAGS ;  Surgeon: Edward Jolly, MD;  Location: [facility];  Service: General;  Laterality: Left;  . BREAST SURGERY     30 rounds radiation  . CARPAL TUNNEL RELEASE Bilateral   . CHOLECYSTECTOMY    . COLONOSCOPY    . RE-EXCISION OF BREAST LUMPECTOMY Left 10/25/2013   Procedure: RE-EXCISION OF BREAST LUMPECTOMY;  Surgeon: Edward Jolly, MD;  Location: [facility];  Service: General;  Laterality: Left;  clean wound class  . TUBAL LIGATION      Family History  Problem Relation Age of Onset  . Hypertension Mother   . Hyperlipidemia Mother   . Heart disease Mother        congestive heart failure  . Cancer Father        hx of melanoma  . Hypertension Sister   . Skin cancer Sister 47       basal cell carcinoma  . Hypertension Brother   .  Heart disease Maternal Grandmother 10  . Breast cancer Maternal Aunt 60  . Colon cancer Paternal Grandmother 68  . Heart disease Maternal Aunt 42  . Prostate cancer Cousin     Allergies  Allergen Reactions  . Penicillins Anaphylaxis  . Erythromycin Nausea And Vomiting  . Adhesive [Tape]     rash  . Amoxicillin     REACTION: unspecified  . Shellfish Allergy     welts  . Sulfamethoxazole Hives    REACTION: unspecified    Current Outpatient Medications on File Prior to Visit  Medication Sig Dispense Refill  . atorvastatin (LIPITOR) 40 MG tablet TAKE 1 TABLET DAILY 90 tablet 1  . Calcium Carbonate-Vitamin D (CALCIUM + D PO) Take 1 tablet by mouth 2 (two) times daily before a meal.     . cetirizine (ZYRTEC) 10 MG tablet Take 10 mg by mouth daily as needed for allergies.    Marland Kitchen esomeprazole (NEXIUM) 40 MG capsule TAKE 1 CAPSULE DAILY 90 capsule 3   . hydrochlorothiazide (HYDRODIURIL) 25 MG tablet TAKE 1 TABLET DAILY 90 tablet 3  . letrozole (FEMARA) 2.5 MG tablet TAKE 1 TABLET DAILY 90 tablet 3  . Multiple Vitamins-Minerals (WOMENS MULTIVITAMIN PLUS PO) Take 1 tablet by mouth daily.     . pseudoephedrine (SUDAFED) 120 MG 12 hr tablet Take 120 mg by mouth 2 (two) times daily as needed.     . ciprofloxacin (CIPRO) 500 MG tablet Take 1 tablet (500 mg total) by mouth 2 (two) times daily. 6 tablet 0   No current facility-administered medications on file prior to visit.     BP 122/78 (BP Location: Left Arm, Patient Position: Sitting, Cuff Size: Normal)   Pulse 72   Temp 97.6 F (36.4 C) (Oral)   Ht 5\' 2"  (1.575 m)   Wt 179 lb 6.4 oz (81.4 kg)   SpO2 98%   BMI 32.81 kg/m      Review of Systems  Constitutional: Positive for unexpected weight change.  HENT: Negative for congestion, dental problem, hearing loss, rhinorrhea, sinus pressure, sore throat and tinnitus.   Eyes: Negative for pain, discharge and visual disturbance.  Respiratory: Negative for cough and shortness of breath.   Cardiovascular: Negative for chest pain, palpitations and leg swelling.  Gastrointestinal: Negative for abdominal distention, abdominal pain, blood in stool, constipation, diarrhea, nausea and vomiting.  Genitourinary: Negative for difficulty urinating, dysuria, flank pain, frequency, hematuria, pelvic pain, urgency, vaginal bleeding, vaginal discharge and vaginal pain.  Musculoskeletal: Positive for back pain. Negative for arthralgias, gait problem and joint swelling.  Skin: Negative for rash.  Neurological: Negative for dizziness, syncope, speech difficulty, weakness, numbness and headaches.  Hematological: Negative for adenopathy.  Psychiatric/Behavioral: Negative for agitation, behavioral problems and dysphoric mood. The patient is not nervous/anxious.        Objective:   Physical Exam  Constitutional: She is oriented to person, place, and time.  She appears well-developed and well-nourished.  Weight 179  HENT:  Head: Normocephalic.  Right Ear: External ear normal.  Left Ear: External ear normal.  Mouth/Throat: Oropharynx is clear and moist.  Eyes: Conjunctivae and EOM are normal. Pupils are equal, round, and reactive to light.  Neck: Normal range of motion. Neck supple. No thyromegaly present.  Cardiovascular: Normal rate, regular rhythm, normal heart sounds and intact distal pulses.  Pulmonary/Chest: Effort normal and breath sounds normal.  Abdominal: Soft. Bowel sounds are normal. She exhibits no mass. There is no tenderness.  Musculoskeletal: Normal range of motion.  Lymphadenopathy:    She has no cervical adenopathy.  Neurological: She is alert and oriented to person, place, and time.  Skin: Skin is warm and dry. No rash noted.  Psychiatric: She has a normal mood and affect. Her behavior is normal.          Assessment & Plan:   Preventive health examination Essential hypertension History of left breast cancer Aromatase inhibitor therapy.  Patient will see gynecology in the spring suspect will have follow-up bone density study performed at that time at age 65  Dyslipidemia.  Continue statin therapy  Follow-up 6 months  Katie Becker

## 2017-10-06 NOTE — Patient Instructions (Signed)
Limit your sodium (Salt) intake    It is important that you exercise regularly, at least 20 minutes 3 to 4 times per week.  If you develop chest pain or shortness of breath seek  medical attention.  Please check your blood pressure on a regular basis.  If it is consistently greater than 140/90, please make an office appointment.  Return in 6 months for follow-up    

## 2017-10-07 LAB — HEPATITIS C ANTIBODY
HEP C AB: NONREACTIVE
SIGNAL TO CUT-OFF: 0.02 (ref <1.00)

## 2017-10-08 LAB — ALLERGEN PROFILE, SHELLFISH
CLAM IGE: 0.25 kU/L — AB
F023-IGE CRAB: 0.79 kU/L — AB
F080-IgE Lobster: 1.01 kU/L — AB
SCALLOP IGE: 0.4 kU/L — AB
SHRIMP IGE: 1.15 kU/L — AB

## 2017-10-09 ENCOUNTER — Encounter: Payer: Self-pay | Admitting: Internal Medicine

## 2017-10-09 ENCOUNTER — Other Ambulatory Visit: Payer: Self-pay | Admitting: General Surgery

## 2017-10-09 DIAGNOSIS — N6320 Unspecified lump in the left breast, unspecified quadrant: Secondary | ICD-10-CM

## 2017-10-20 ENCOUNTER — Ambulatory Visit
Admission: RE | Admit: 2017-10-20 | Discharge: 2017-10-20 | Disposition: A | Discharge status: Home / Self Care | Payer: BLUE CROSS/BLUE SHIELD | Source: Ambulatory Visit | Attending: General Surgery | Admitting: General Surgery

## 2017-10-20 ENCOUNTER — Other Ambulatory Visit: Payer: Self-pay | Admitting: General Surgery

## 2017-10-20 DIAGNOSIS — N6320 Unspecified lump in the left breast, unspecified quadrant: Secondary | ICD-10-CM

## 2017-12-06 ENCOUNTER — Other Ambulatory Visit: Payer: Self-pay | Admitting: Internal Medicine

## 2017-12-09 NOTE — Telephone Encounter (Signed)
Medication filled to pharmacy as requested.   

## 2018-01-02 ENCOUNTER — Encounter: Payer: Self-pay | Admitting: Hematology and Oncology

## 2018-01-02 ENCOUNTER — Encounter: Payer: Self-pay | Admitting: Internal Medicine

## 2018-01-09 ENCOUNTER — Other Ambulatory Visit: Payer: Self-pay

## 2018-01-09 MED ORDER — ATORVASTATIN CALCIUM 40 MG PO TABS: 40.0000 mg | ORAL_TABLET | Freq: Every day | ORAL | 0 refills | Status: DC

## 2018-01-09 MED ORDER — HYDROCHLOROTHIAZIDE 25 MG PO TABS: 25.0000 mg | ORAL_TABLET | Freq: Every day | ORAL | 0 refills | Status: DC

## 2018-01-09 MED ORDER — ESOMEPRAZOLE MAGNESIUM 40 MG PO CPDR: 40.0000 mg | DELAYED_RELEASE_CAPSULE | Freq: Every day | ORAL | 0 refills | Status: DC

## 2018-01-13 ENCOUNTER — Other Ambulatory Visit: Payer: Self-pay

## 2018-01-13 DIAGNOSIS — C50512 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: Secondary | ICD-10-CM

## 2018-01-13 DIAGNOSIS — Z170 Estrogen receptor positive status [ER+]: Principal | ICD-10-CM

## 2018-01-13 MED ORDER — LETROZOLE 2.5 MG PO TABS: 2.5000 mg | ORAL_TABLET | Freq: Every day | ORAL | 0 refills | Status: DC

## 2018-01-19 ENCOUNTER — Telehealth: Payer: Self-pay | Admitting: Family Medicine

## 2018-01-19 NOTE — Telephone Encounter (Signed)
Copied from [place] [id number]. Topic: General - Other >> Jan 19, 2018  1:57 PM Yvette Rack wrote: Reason for CRM: patient calling to let Dr Burnice Logan know that [facility] mail service is faxing over a request to get the esomeprazole (NEXIUM) 40 MG capsule lower to a tier 1 from a tier 4 will cost her $299 for the Nexium the form have to have that pt has ulcers and gerd

## 2018-01-20 ENCOUNTER — Encounter: Payer: Self-pay | Admitting: Internal Medicine

## 2018-01-23 ENCOUNTER — Telehealth: Payer: Self-pay | Admitting: Internal Medicine

## 2018-01-23 NOTE — Telephone Encounter (Signed)
Prior auth sent to [id number].

## 2018-01-23 NOTE — Telephone Encounter (Signed)
Copied from [place] [id number]. Topic: Quick Communication - See Telephone Encounter >> Jan 23, 2018 10:12 AM Bea Graff, NT wrote: CRM for notification. See Telephone encounter for: 01/23/18. Pt calling back with the information needed to get prior auth started for esomeprazole (NEXIUM) 40mg . BIN#: [id number] PCN: [id number] Group#: [id number] ID#: [id number] Prior auth number: [id number]

## 2018-01-23 NOTE — Telephone Encounter (Signed)
I left a detailed message at the pts cell number to call back with current prescription insurance info as [employer], [place] could not be submitted via [email or web address]. BIN number, PCN, group and ID numbers are needed and a CRM was created.

## 2018-01-23 NOTE — Telephone Encounter (Signed)
See prior note

## 2018-01-29 ENCOUNTER — Other Ambulatory Visit: Payer: Self-pay

## 2018-01-29 MED ORDER — PANTOPRAZOLE SODIUM 40 MG PO TBEC: 40.0000 mg | DELAYED_RELEASE_TABLET | Freq: Every day | ORAL | 1 refills | Status: DC

## 2018-01-29 NOTE — Telephone Encounter (Signed)
Medication sent to wrong pharmacy. Medication cancelled and resent to [facility].

## 2018-03-06 DIAGNOSIS — M1812 Unilateral primary osteoarthritis of first carpometacarpal joint, left hand: Secondary | ICD-10-CM | POA: Diagnosis not present

## 2018-03-06 DIAGNOSIS — M1811 Unilateral primary osteoarthritis of first carpometacarpal joint, right hand: Secondary | ICD-10-CM | POA: Diagnosis not present

## 2018-03-06 DIAGNOSIS — M180 Bilateral primary osteoarthritis of first carpometacarpal joints: Secondary | ICD-10-CM | POA: Diagnosis not present

## 2018-03-08 ENCOUNTER — Other Ambulatory Visit: Payer: Self-pay | Admitting: Internal Medicine

## 2018-03-13 ENCOUNTER — Other Ambulatory Visit: Payer: Self-pay | Admitting: General Surgery

## 2018-03-13 DIAGNOSIS — D0512 Intraductal carcinoma in situ of left breast: Secondary | ICD-10-CM

## 2018-04-03 DIAGNOSIS — H524 Presbyopia: Secondary | ICD-10-CM | POA: Diagnosis not present

## 2018-04-06 ENCOUNTER — Ambulatory Visit: Payer: Medicare HMO | Admitting: Internal Medicine

## 2018-04-06 ENCOUNTER — Encounter: Payer: Self-pay | Admitting: Internal Medicine

## 2018-04-06 VITALS — BP 100/60 | Temp 98.0°F | Wt 174.0 lb

## 2018-04-06 DIAGNOSIS — K219 Gastro-esophageal reflux disease without esophagitis: Secondary | ICD-10-CM

## 2018-04-06 DIAGNOSIS — I10 Essential (primary) hypertension: Secondary | ICD-10-CM | POA: Diagnosis not present

## 2018-04-06 NOTE — Progress Notes (Signed)
Subjective:    Patient ID: Katie Becker, female    DOB: 06-25-1951, 67 y.o.   MRN: [id number]  HPI 67 year old patient who is seen today for biannual follow-up. She has a history of essential hypertension. Doing reasonably well she does have some complaints of some fatigue and left wrist discomfort. She remains on chronic PPI therapy for chronic reflux.  She also has a history of prior peptic ulcer disease.  Past Medical History:  Diagnosis Date  . Arthritis    back  . 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 DIVERTICULOSIS, COLON 04/20/2007  . GERD 04/20/2007  . H/O hiatal hernia   . Heart murmur    told by PCP- years ago-never had echo  . HEPATOMEGALY, HX OF 12/21/2009  . HYPERLIPIDEMIA 04/20/2007  . HYPERTENSION 04/20/2007  . LOW BACK PAIN 08/16/2008  . MENOPAUSAL SYNDROME 04/20/2007  . PEPTIC ULCER DISEASE 09/06/2010  . Personal history of chemotherapy    2015  . Personal history of radiation therapy    2015  . Wears glasses      Social History   Socioeconomic History  . Marital status: Widowed    Spouse name: Not on file  . Number of children: 2  . Years of education: Not on file  . Highest education level: Not on file  Occupational History    Employer: [employer]  Social Needs  . Financial resource strain: Not on file  . Food insecurity:    Worry: Not on file    Inability: Not on file  . Transportation needs:    Medical: Not on file    Non-medical: Not on file  Tobacco Use  . Smoking status: Former Smoker    Last attempt to quit: 10/21/1978    Years since quitting: 39.4  . Smokeless tobacco: Never Used  Substance and Sexual Activity  . Alcohol use: No  . Drug use: No  . Sexual activity: Not Currently  Lifestyle  . Physical activity:    Days per week: Not on file    Minutes per session: Not on file  . Stress: Not on file  Relationships  . Social connections:    Talks on  phone: Not on file    Gets together: Not on file    Attends religious service: Not on file    Active member of club or organization: Not on file    Attends meetings of clubs or organizations: Not on file    Relationship status: Not on file  . Intimate partner violence:    Fear of current or ex partner: Not on file    Emotionally abused: Not on file    Physically abused: Not on file    Forced sexual activity: Not on file  Other Topics Concern  . Not on file  Social History Narrative  . Not on file    Past Surgical History:  Procedure Laterality Date  . ABDOMINAL HYSTERECTOMY    . BREAST LUMPECTOMY Left    10/07/13 & 10/25/13  . BREAST LUMPECTOMY WITH NEEDLE LOCALIZATION Left 10/07/2013   Procedure: BREAST LUMPECTOMY WITH NEEDLE LOCALIZATION EXCISION OF BILATERAL AXILLARY SKIN TAGS ;  Surgeon: Edward Jolly, MD;  Location: [facility];  Service: General;  Laterality: Left;  . BREAST SURGERY     30 rounds radiation  . CARPAL TUNNEL RELEASE Bilateral   . CHOLECYSTECTOMY    . COLONOSCOPY    .  RE-EXCISION OF BREAST LUMPECTOMY Left 10/25/2013   Procedure: RE-EXCISION OF BREAST LUMPECTOMY;  Surgeon: Edward Jolly, MD;  Location: [facility];  Service: General;  Laterality: Left;  clean wound class  . TUBAL LIGATION      Family History  Problem Relation Age of Onset  . Hypertension Mother   . Hyperlipidemia Mother   . Heart disease Mother        congestive heart failure  . Cancer Father        hx of melanoma  . Hypertension Sister   . Skin cancer Sister 71       basal cell carcinoma  . Hypertension Brother   . Heart disease Maternal Grandmother 65  . Breast cancer Maternal Aunt 60  . Colon cancer Paternal Grandmother 63  . Heart disease Maternal Aunt 42  . Prostate cancer Cousin     Allergies  Allergen Reactions  . Penicillins Anaphylaxis  . Erythromycin Nausea And Vomiting  . Adhesive [Tape]     rash  . Amoxicillin     REACTION: unspecified  .  Shellfish Allergy     welts  . Sulfamethoxazole Hives    REACTION: unspecified    Current Outpatient Medications on File Prior to Visit  Medication Sig Dispense Refill  . atorvastatin (LIPITOR) 40 MG tablet Take 1 tablet (40 mg total) by mouth daily. 90 tablet 0  . Calcium Carbonate-Vitamin D (CALCIUM + D PO) Take 1 tablet by mouth 2 (two) times daily before a meal.     . cetirizine (ZYRTEC) 10 MG tablet Take 10 mg by mouth daily as needed for allergies.    Marland Kitchen esomeprazole (NEXIUM) 40 MG capsule TAKE 1 CAPSULE DAILY 90 capsule 3  . hydrochlorothiazide (HYDRODIURIL) 25 MG tablet TAKE 1 TABLET DAILY 90 tablet 3  . letrozole (FEMARA) 2.5 MG tablet Take 1 tablet (2.5 mg total) by mouth daily. 90 tablet 0  . Multiple Vitamins-Minerals (WOMENS MULTIVITAMIN PLUS PO) Take 1 tablet by mouth daily.      No current facility-administered medications on file prior to visit.     BP 100/60 (BP Location: Right Arm, Patient Position: Sitting, Cuff Size: Large)   Temp 98 F (36.7 C) (Oral)   Wt 174 lb (78.9 kg)   BMI 31.83 kg/m      Review of Systems  Constitutional: Positive for fatigue.  HENT: Negative for congestion, dental problem, hearing loss, rhinorrhea, sinus pressure, sore throat and tinnitus.   Eyes: Negative for pain, discharge and visual disturbance.  Respiratory: Negative for cough and shortness of breath.   Cardiovascular: Negative for chest pain, palpitations and leg swelling.  Gastrointestinal: Negative for abdominal distention, abdominal pain, blood in stool, constipation, diarrhea, nausea and vomiting.  Genitourinary: Negative for difficulty urinating, dysuria, flank pain, frequency, hematuria, pelvic pain, urgency, vaginal bleeding, vaginal discharge and vaginal pain.  Musculoskeletal: Negative for arthralgias, gait problem and joint swelling.       Left wrist pain  Skin: Negative for rash.  Neurological: Negative for dizziness, syncope, speech difficulty, weakness, numbness  and headaches.  Hematological: Negative for adenopathy.  Psychiatric/Behavioral: Negative for agitation, behavioral problems and dysphoric mood. The patient is not nervous/anxious.        Objective:   Physical Exam  Constitutional: She is oriented to person, place, and time. She appears well-developed and well-nourished.  HENT:  Head: Normocephalic.  Right Ear: External ear normal.  Left Ear: External ear normal.  Mouth/Throat: Oropharynx is clear and moist.  Eyes: Pupils  are equal, round, and reactive to light. Conjunctivae and EOM are normal.  Neck: Normal range of motion. Neck supple. No thyromegaly present.  Cardiovascular: Normal rate, regular rhythm, normal heart sounds and intact distal pulses.  Pulmonary/Chest: Effort normal and breath sounds normal.  Abdominal: Soft. Bowel sounds are normal. She exhibits no mass. There is no tenderness.  Musculoskeletal: Normal range of motion.  Lymphadenopathy:    She has no cervical adenopathy.  Neurological: She is alert and oriented to person, place, and time.  Skin: Skin is warm and dry. No rash noted.  Psychiatric: She has a normal mood and affect. Her behavior is normal.          Assessment & Plan:   Essential hypertension stable Gastroesophageal reflux disease Dyslipidemia continue statin therapy  Medications updated CPX in 6 months with new provider  Marletta Lor

## 2018-04-06 NOTE — Patient Instructions (Signed)
Limit your sodium (Salt) intake  Please check your blood pressure on a regular basis.  If it is consistently greater than 140/90, please make an office appointment.    It is important that you exercise regularly, at least 20 minutes 3 to 4 times per week.  If you develop chest pain or shortness of breath seek  medical attention.  Return in 6 months for follow-up   

## 2018-05-01 DIAGNOSIS — N952 Postmenopausal atrophic vaginitis: Secondary | ICD-10-CM | POA: Diagnosis not present

## 2018-05-01 DIAGNOSIS — N898 Other specified noninflammatory disorders of vagina: Secondary | ICD-10-CM | POA: Diagnosis not present

## 2018-05-01 DIAGNOSIS — Z01419 Encounter for gynecological examination (general) (routine) without abnormal findings: Secondary | ICD-10-CM | POA: Diagnosis not present

## 2018-05-04 ENCOUNTER — Ambulatory Visit
Admission: RE | Admit: 2018-05-04 | Discharge: 2018-05-04 | Disposition: A | Discharge status: Home / Self Care | Payer: Medicare HMO | Source: Ambulatory Visit | Attending: General Surgery | Admitting: General Surgery

## 2018-05-04 DIAGNOSIS — N6320 Unspecified lump in the left breast, unspecified quadrant: Secondary | ICD-10-CM | POA: Diagnosis not present

## 2018-05-04 DIAGNOSIS — N6489 Other specified disorders of breast: Secondary | ICD-10-CM | POA: Diagnosis not present

## 2018-05-04 DIAGNOSIS — D0512 Intraductal carcinoma in situ of left breast: Secondary | ICD-10-CM

## 2018-05-04 MED ORDER — GADOBENATE DIMEGLUMINE 529 MG/ML IV SOLN
16.0000 mL | Freq: Once | INTRAVENOUS | Status: AC | PRN
  Administered 2018-05-04: 16 mL via INTRAVENOUS

## 2018-05-06 ENCOUNTER — Encounter: Payer: Self-pay | Admitting: Internal Medicine

## 2018-05-06 ENCOUNTER — Other Ambulatory Visit: Payer: Self-pay | Admitting: Internal Medicine

## 2018-05-06 ENCOUNTER — Other Ambulatory Visit: Payer: Self-pay | Admitting: Hematology and Oncology

## 2018-05-06 DIAGNOSIS — Z170 Estrogen receptor positive status [ER+]: Principal | ICD-10-CM

## 2018-05-06 DIAGNOSIS — C50512 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: Secondary | ICD-10-CM

## 2018-05-11 ENCOUNTER — Other Ambulatory Visit: Payer: Self-pay | Admitting: Internal Medicine

## 2018-05-13 ENCOUNTER — Telehealth: Payer: Self-pay | Admitting: Internal Medicine

## 2018-05-13 NOTE — Telephone Encounter (Signed)
Dr K please advise..

## 2018-05-13 NOTE — Telephone Encounter (Signed)
Copied from [place] [id number]. Topic: Quick Communication - See Telephone Encounter >> May 13, 2018 10:51 AM Ahmed Prima L wrote: CRM for notification. See Telephone encounter for: 05/13/18.  [facility] called about medication hydrochlorothiazide (HYDRODIURIL) 25 MG tablet, she said that the patient has an allergy to Sulfa. Can they still fill this?  Call back @ [phone] order number [id number]

## 2018-05-13 NOTE — Telephone Encounter (Signed)
Yes continue hydrochlorothiazide

## 2018-05-13 NOTE — Telephone Encounter (Signed)
Called pharmacy and informed them that Rx is ok to fill

## 2018-05-20 DIAGNOSIS — R69 Illness, unspecified: Secondary | ICD-10-CM | POA: Diagnosis not present

## 2018-05-28 ENCOUNTER — Inpatient Hospital Stay: Payer: Medicare HMO | Attending: Hematology and Oncology | Admitting: Hematology and Oncology

## 2018-05-28 ENCOUNTER — Telehealth: Payer: Self-pay | Admitting: Hematology and Oncology

## 2018-05-28 DIAGNOSIS — C50512 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: Secondary | ICD-10-CM | POA: Diagnosis not present

## 2018-05-28 DIAGNOSIS — Z923 Personal history of irradiation: Secondary | ICD-10-CM | POA: Diagnosis not present

## 2018-05-28 DIAGNOSIS — Z170 Estrogen receptor positive status [ER+]: Secondary | ICD-10-CM | POA: Diagnosis not present

## 2018-05-28 DIAGNOSIS — D0512 Intraductal carcinoma in situ of left breast: Secondary | ICD-10-CM | POA: Insufficient documentation

## 2018-05-28 DIAGNOSIS — Z79811 Long term (current) use of aromatase inhibitors: Secondary | ICD-10-CM | POA: Insufficient documentation

## 2018-05-28 MED ORDER — LETROZOLE 2.5 MG PO TABS: 2.5000 mg | ORAL_TABLET | Freq: Every day | ORAL | 3 refills | Status: DC

## 2018-05-28 NOTE — Telephone Encounter (Signed)
Gave patient avs and calendar of upcoming appts.  °

## 2018-05-28 NOTE — Progress Notes (Signed)
Patient Care Team: Marletta Lor, MD as PCP - General  DIAGNOSIS:  Encounter Diagnosis  Name Primary?  . Malignant neoplasm of lower-outer quadrant of left breast of female, estrogen receptor positive ([place])     SUMMARY OF ONCOLOGIC HISTORY:   Breast cancer of lower-outer quadrant of left female breast ([place])   10/07/2013 Surgery    Left breast lumpectomy: DCIS ER positive PR positive, reexcision of positive margins on 10/25/2013, final margins negative    11/08/2013 - 12/27/2013 Radiation Therapy    Adjuvant radiation therapy    01/03/2014 Procedure    Genetic testing negative for breast and ovarian cancer panels    01/24/2014 - 10/16/2014 Anti-estrogen oral therapy    Tamoxifen 20 mg daily 5 years    09/13/2014 Relapse/Recurrence    MRI of the breast revealed a 3.5 cm area of non-mass enhancement left breast lateral 3.5 x 1.9 x 1.6 cm, oval circumscribed T2 bright enhancing mass right breast upper outer quadrant 1 x 0.7 cm, linear non-mass enhancement 11 x 6 x 3 mm    10/07/2014 Surgery    Left breast lumpectomy: DCIS grade 10.6 cm ER 100%, PR 0%, multiple skin tags benign fibroepithelial polyps, reexcision 10/25/2013 benign fibrocystic changes with calcifications    12/27/2014 -  Anti-estrogen oral therapy    Anastrozole 1 mg daily changed to letrozole 2.5 mg daily 01/05/2017due to musculoskeletal aches and pains     CHIEF COMPLIANT: Follow-up on letrozole  INTERVAL HISTORY: Katie Becker is a 67 year old with above-mentioned left breast cancer treated with lumpectomy followed by radiation and is currently on antiestrogen therapy with letrozole.  She appears to be tolerating it extremely well.  She had a MRI of the breast done in July which showed chronic changes and they recommended a repeat MRI of the left breast in 1 year.  She follows with Dr. Excell Seltzer for her MRI orders.  She denies any major hot flashes or myalgias.  REVIEW OF SYSTEMS:   Constitutional: Denies  fevers, chills or abnormal weight loss Eyes: Denies blurriness of vision Ears, nose, mouth, throat, and face: Denies mucositis or sore throat Respiratory: Denies cough, dyspnea or wheezes Cardiovascular: Denies palpitation, chest discomfort Gastrointestinal:  Denies nausea, heartburn or change in bowel habits Skin: Denies abnormal skin rashes Lymphatics: Denies new lymphadenopathy or easy bruising Neurological:Denies numbness, tingling or new weaknesses Behavioral/Psych: Mood is stable, no new changes  Extremities: No lower extremity edema   All other systems were reviewed with the patient and are negative.  I have reviewed the past medical history, past surgical history, social history and family history with the patient and they are unchanged from previous note.  ALLERGIES:  is allergic to penicillins; erythromycin; adhesive [tape]; amoxicillin; shellfish allergy; and sulfamethoxazole.  MEDICATIONS:  Current Outpatient Medications  Medication Sig Dispense Refill  . atorvastatin (LIPITOR) 40 MG tablet TAKE 1 TABLET DAILY 90 tablet 0  . Calcium Carbonate-Vitamin D (CALCIUM + D PO) Take 1 tablet by mouth 2 (two) times daily before a meal.     . cetirizine (ZYRTEC) 10 MG tablet Take 10 mg by mouth daily as needed for allergies.    Marland Kitchen esomeprazole (NEXIUM) 40 MG capsule TAKE 1 CAPSULE DAILY 90 capsule 0  . hydrochlorothiazide (HYDRODIURIL) 25 MG tablet TAKE 1 TABLET DAILY 90 tablet 0  . letrozole (FEMARA) 2.5 MG tablet Take 1 tablet (2.5 mg total) by mouth daily. 90 tablet 0  . letrozole (FEMARA) 2.5 MG tablet TAKE 1 TABLET DAILY 90 tablet  0  . Multiple Vitamins-Minerals (WOMENS MULTIVITAMIN PLUS PO) Take 1 tablet by mouth daily.      No current facility-administered medications for this visit.     PHYSICAL EXAMINATION: ECOG PERFORMANCE STATUS: 1 - Symptomatic but completely ambulatory  Vitals:   05/28/18 1015  BP: 128/70  Pulse: 77  Resp: 18  Temp: 97.9 F (36.6 C)  SpO2: 97%     Filed Weights   05/28/18 1015  Weight: 174 lb 8 oz (79.2 kg)    GENERAL:alert, no distress and comfortable SKIN: skin color, texture, turgor are normal, no rashes or significant lesions EYES: normal, Conjunctiva are pink and non-injected, sclera clear OROPHARYNX:no exudate, no erythema and lips, buccal mucosa, and tongue normal  NECK: supple, thyroid normal size, non-tender, without nodularity LYMPH:  no palpable lymphadenopathy in the cervical, axillary or inguinal LUNGS: clear to auscultation and percussion with normal breathing effort HEART: regular rate & rhythm and no murmurs and no lower extremity edema ABDOMEN:abdomen soft, non-tender and normal bowel sounds MUSCULOSKELETAL:no cyanosis of digits and no clubbing  NEURO: alert & oriented x 3 with fluent speech, no focal motor/sensory deficits EXTREMITIES: No lower extremity edema   LABORATORY DATA:  I have reviewed the data as listed CMP Latest Ref Rng & Units 10/06/2017 09/25/2016 09/22/2015  Glucose 70 - 99 mg/dL 108(H) 110(H) 114(H)  BUN 6 - 23 mg/dL 13 14 15   Creatinine 0.40 - 1.20 mg/dL 0.93 0.96 0.99  Sodium 135 - 145 mEq/L 141 143 142  Potassium 3.5 - 5.1 mEq/L 3.8 3.5 3.7  Chloride 96 - 112 mEq/L 99 103 101  CO2 19 - 32 mEq/L 33(H) 31 32  Calcium 8.4 - 10.5 mg/dL 10.2 9.8 9.8  Total Protein 6.0 - 8.3 g/dL 7.3 6.8 7.3  Total Bilirubin 0.2 - 1.2 mg/dL 0.8 0.8 0.8  Alkaline Phos 39 - 117 U/L 100 107 92  AST 0 - 37 U/L 43(H) 28 68(H)  ALT 0 - 35 U/L 50(H) 31 70(H)    Lab Results  Component Value Date   WBC 7.0 10/06/2017   HGB 14.9 10/06/2017   HCT 45.0 10/06/2017   MCV 87.0 10/06/2017   PLT 232.0 10/06/2017   NEUTROABS 4.4 10/06/2017    ASSESSMENT & PLAN:  Breast cancer of lower-outer quadrant of left female breast 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 Breast exam 05/28/2018: No concerns on exam 2. Mammograms 09/22/2017: No evidence of cancer. 3. Bone density 04/04/2015: Normal bone density, she will need a new bone density test 4.  Breast MRI 05/04/2018: Benign findings 1 year follow-up MRI of the left breast recommended  Patient and her sister take care of her ailing mother age 26 and requires 24/7 home care. This does not give her much time to do exercise. She will try to make some more time to get some activity.  Return to clinic in 1 year for follow-up    No orders of the defined types were placed in this encounter.  The patient has a good understanding of the overall plan. she agrees with it. she will call with any problems that may develop before the next visit  here.   Harriette Ohara, MD 05/28/18

## 2018-05-28 NOTE — Assessment & Plan Note (Signed)
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 Breast exam 05/28/2018: No concerns on exam 2. Mammograms 09/22/2017: No evidence of cancer. 3. Bone density 04/04/2015: Normal bone density, she will need a new bone density test 4.  Breast MRI 05/04/2018: Benign findings 1 year follow-up MRI of the left breast recommended  Patient and her sister take care of her ailing mother age 15 and requires 24/7 home care. This does not give her much time to do exercise. She will try to make some more time to get some activity.  Return to clinic in 1 year for follow-up

## 2018-06-08 ENCOUNTER — Other Ambulatory Visit: Payer: Self-pay | Admitting: Hematology and Oncology

## 2018-06-08 ENCOUNTER — Encounter: Payer: Self-pay | Admitting: Hematology and Oncology

## 2018-06-08 ENCOUNTER — Other Ambulatory Visit: Payer: Self-pay

## 2018-06-08 DIAGNOSIS — E2839 Other primary ovarian failure: Secondary | ICD-10-CM

## 2018-06-08 DIAGNOSIS — Z9889 Other specified postprocedural states: Secondary | ICD-10-CM

## 2018-06-08 DIAGNOSIS — C50512 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: Secondary | ICD-10-CM

## 2018-06-08 NOTE — Progress Notes (Signed)
Pt request to get bone density checked since she is on letrozole. Last bone density done 2016.Will place order for breast center per pt request.

## 2018-06-09 DIAGNOSIS — I10 Essential (primary) hypertension: Secondary | ICD-10-CM | POA: Diagnosis not present

## 2018-06-09 DIAGNOSIS — E669 Obesity, unspecified: Secondary | ICD-10-CM | POA: Diagnosis not present

## 2018-06-09 DIAGNOSIS — Z6832 Body mass index (BMI) 32.0-32.9, adult: Secondary | ICD-10-CM | POA: Diagnosis not present

## 2018-06-09 DIAGNOSIS — K219 Gastro-esophageal reflux disease without esophagitis: Secondary | ICD-10-CM | POA: Diagnosis not present

## 2018-06-09 DIAGNOSIS — E785 Hyperlipidemia, unspecified: Secondary | ICD-10-CM | POA: Diagnosis not present

## 2018-06-09 DIAGNOSIS — C50919 Malignant neoplasm of unspecified site of unspecified female breast: Secondary | ICD-10-CM | POA: Diagnosis not present

## 2018-06-09 DIAGNOSIS — Z803 Family history of malignant neoplasm of breast: Secondary | ICD-10-CM | POA: Diagnosis not present

## 2018-06-09 DIAGNOSIS — T7840XD Allergy, unspecified, subsequent encounter: Secondary | ICD-10-CM | POA: Diagnosis not present

## 2018-06-09 DIAGNOSIS — Z79811 Long term (current) use of aromatase inhibitors: Secondary | ICD-10-CM | POA: Diagnosis not present

## 2018-06-09 DIAGNOSIS — R32 Unspecified urinary incontinence: Secondary | ICD-10-CM | POA: Diagnosis not present

## 2018-07-03 MED ORDER — ESOMEPRAZOLE MAGNESIUM 40 MG PO CPDR: 40.0000 mg | DELAYED_RELEASE_CAPSULE | Freq: Every day | ORAL | 0 refills | Status: DC

## 2018-07-03 MED ORDER — ATORVASTATIN CALCIUM 40 MG PO TABS: 40.0000 mg | ORAL_TABLET | Freq: Every day | ORAL | 0 refills | Status: DC

## 2018-07-03 MED ORDER — HYDROCHLOROTHIAZIDE 25 MG PO TABS: 25.0000 mg | ORAL_TABLET | Freq: Every day | ORAL | 0 refills | Status: DC

## 2018-07-03 NOTE — Telephone Encounter (Signed)
Spoke to pt to verify pharmacy. Rx has been sent to [facility].

## 2018-07-07 DIAGNOSIS — R69 Illness, unspecified: Secondary | ICD-10-CM | POA: Diagnosis not present

## 2018-07-09 DIAGNOSIS — M25562 Pain in left knee: Secondary | ICD-10-CM | POA: Diagnosis not present

## 2018-07-25 ENCOUNTER — Other Ambulatory Visit: Payer: Self-pay | Admitting: Internal Medicine

## 2018-08-02 DIAGNOSIS — R69 Illness, unspecified: Secondary | ICD-10-CM | POA: Diagnosis not present

## 2018-08-07 ENCOUNTER — Encounter: Payer: Self-pay | Admitting: Hematology and Oncology

## 2018-09-03 DIAGNOSIS — M25562 Pain in left knee: Secondary | ICD-10-CM | POA: Diagnosis not present

## 2018-09-09 DIAGNOSIS — M25562 Pain in left knee: Secondary | ICD-10-CM | POA: Diagnosis not present

## 2018-09-09 DIAGNOSIS — M1712 Unilateral primary osteoarthritis, left knee: Secondary | ICD-10-CM | POA: Diagnosis not present

## 2018-09-23 ENCOUNTER — Ambulatory Visit
Admission: RE | Admit: 2018-09-23 | Discharge: 2018-09-23 | Disposition: A | Discharge status: Home / Self Care | Payer: Medicare HMO | Source: Ambulatory Visit | Attending: Hematology and Oncology | Admitting: Hematology and Oncology

## 2018-09-23 ENCOUNTER — Telehealth: Payer: Self-pay | Admitting: Hematology and Oncology

## 2018-09-23 DIAGNOSIS — Z853 Personal history of malignant neoplasm of breast: Secondary | ICD-10-CM | POA: Diagnosis not present

## 2018-09-23 DIAGNOSIS — Z9889 Other specified postprocedural states: Secondary | ICD-10-CM

## 2018-09-23 DIAGNOSIS — R922 Inconclusive mammogram: Secondary | ICD-10-CM | POA: Diagnosis not present

## 2018-09-23 DIAGNOSIS — E2839 Other primary ovarian failure: Secondary | ICD-10-CM

## 2018-09-23 DIAGNOSIS — Z13820 Encounter for screening for osteoporosis: Secondary | ICD-10-CM | POA: Diagnosis not present

## 2018-09-23 NOTE — Telephone Encounter (Signed)
I left a voicemail that the bone density was normal with a T score of -0.8.

## 2018-10-05 ENCOUNTER — Other Ambulatory Visit: Payer: Self-pay | Admitting: Internal Medicine

## 2018-10-20 ENCOUNTER — Encounter: Payer: Self-pay | Admitting: Internal Medicine

## 2018-10-20 ENCOUNTER — Ambulatory Visit (INDEPENDENT_AMBULATORY_CARE_PROVIDER_SITE_OTHER): Payer: Medicare HMO | Admitting: Internal Medicine

## 2018-10-20 VITALS — BP 136/76 | HR 69 | Temp 97.9°F | Ht 62.25 in | Wt 177.0 lb

## 2018-10-20 DIAGNOSIS — I10 Essential (primary) hypertension: Secondary | ICD-10-CM

## 2018-10-20 DIAGNOSIS — C50512 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: Secondary | ICD-10-CM

## 2018-10-20 DIAGNOSIS — E785 Hyperlipidemia, unspecified: Secondary | ICD-10-CM

## 2018-10-20 DIAGNOSIS — K219 Gastro-esophageal reflux disease without esophagitis: Secondary | ICD-10-CM | POA: Diagnosis not present

## 2018-10-20 DIAGNOSIS — Z7189 Other specified counseling: Secondary | ICD-10-CM

## 2018-10-20 DIAGNOSIS — Z23 Encounter for immunization: Secondary | ICD-10-CM

## 2018-10-20 LAB — COMPREHENSIVE METABOLIC PANEL
ALBUMIN: 4.4 g/dL (ref 3.5–5.2)
ALT: 45 U/L — ABNORMAL HIGH (ref 0–35)
AST: 37 U/L (ref 0–37)
Alkaline Phosphatase: 99 U/L (ref 39–117)
BUN: 13 mg/dL (ref 6–23)
CHLORIDE: 103 meq/L (ref 96–112)
CO2: 30 mEq/L (ref 19–32)
Calcium: 9.7 mg/dL (ref 8.4–10.5)
Creatinine, Ser: 0.9 mg/dL (ref 0.40–1.20)
GFR: 66.65 mL/min (ref >60.00)
GLUCOSE: 123 mg/dL — AB (ref 70–99)
Potassium: 3.8 mEq/L (ref 3.5–5.1)
SODIUM: 141 meq/L (ref 135–145)
Total Bilirubin: 0.5 mg/dL (ref 0.2–1.2)
Total Protein: 7.1 g/dL (ref 6.0–8.3)

## 2018-10-20 LAB — CBC
HCT: 43.7 % (ref 36.0–46.0)
HEMOGLOBIN: 14.8 g/dL (ref 12.0–15.0)
MCHC: 33.9 g/dL (ref 30.0–36.0)
MCV: 85.7 fl (ref 78.0–100.0)
Platelets: 213 10*3/uL (ref 150.0–400.0)
RBC: 5.1 Mil/uL (ref 3.87–5.11)
RDW: 12.7 % (ref 11.5–15.5)
WBC: 6.5 10*3/uL (ref 4.0–10.5)

## 2018-10-20 LAB — LIPID PANEL
Cholesterol: 171 mg/dL (ref 0–200)
HDL: 48.3 mg/dL (ref >39.00)
LDL Cholesterol: 91 mg/dL (ref 0–99)
NONHDL: 122.56
Total CHOL/HDL Ratio: 4
Triglycerides: 156 mg/dL — ABNORMAL HIGH (ref 0.0–149.0)
VLDL: 31.2 mg/dL (ref 0.0–40.0)

## 2018-10-20 LAB — T4, FREE: FREE T4: 0.81 ng/dL (ref 0.60–1.60)

## 2018-10-20 MED ORDER — ATORVASTATIN CALCIUM 40 MG PO TABS: 40.0000 mg | ORAL_TABLET | Freq: Every day | ORAL | 3 refills | Status: DC

## 2018-10-20 MED ORDER — ESOMEPRAZOLE MAGNESIUM 40 MG PO CPDR: 40.0000 mg | DELAYED_RELEASE_CAPSULE | Freq: Every day | ORAL | 3 refills | Status: DC

## 2018-10-20 NOTE — Assessment & Plan Note (Signed)
Doing okay on the statin for primary prevention

## 2018-10-20 NOTE — Assessment & Plan Note (Signed)
Continues on the letrozole

## 2018-10-20 NOTE — Addendum Note (Signed)
Addended by: Pilar Grammes on: 10/20/2018 10:59 AM   Modules accepted: Orders

## 2018-10-20 NOTE — Assessment & Plan Note (Signed)
BP Readings from Last 3 Encounters:  10/20/18 136/76  05/28/18 128/70  04/06/18 100/60   Good control Due for labs

## 2018-10-20 NOTE — Assessment & Plan Note (Signed)
See social history 

## 2018-10-20 NOTE — Assessment & Plan Note (Signed)
Needs the PPI daily or she gets symptoms

## 2018-10-20 NOTE — Progress Notes (Signed)
Subjective:    Patient ID: Katie Becker, female    DOB: [birth year], 67 y.o.   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--- "I am a need to know person"  Has breast cancer Had RT Then tamoxifen then anastrozole Continues on the letrozole for now Follows with Dr Lindi Adie  Chronic joint pains Gets physical therapy Tries to avoid pain medications Back and other joints 3 known compressed vertebrae as well  Has stopped most of her sugared soda and juice Thinks the sugar worsens her joints (has tested it)  On HCTZ for BP and to keep fluid down No chest pain or SOB Does go to gym No dizziness or syncope No edema  On statin No myalgias or GI problems  Had 3 bleeding ulcers at 30  Lots of acid H pylori was negative Has been on treatment for 20 years---zantac and then nexium Severe acid symptoms when she skipped days  Current Outpatient Medications on File Prior to Visit  Medication Sig Dispense Refill  . atorvastatin (LIPITOR) 40 MG tablet Take 1 tablet (40 mg total) by mouth daily. 90 tablet 0  . Calcium Carbonate-Vitamin D (CALCIUM + D PO) Take 1 tablet by mouth 2 (two) times daily before a meal.     . cetirizine (ZYRTEC) 10 MG tablet Take 10 mg by mouth daily as needed for allergies.    Marland Kitchen esomeprazole (NEXIUM) 40 MG capsule Take 1 capsule (40 mg total) by mouth daily. 90 capsule 0  . hydrochlorothiazide (HYDRODIURIL) 25 MG tablet Take 1 tablet (25 mg total) by mouth daily. 90 tablet 0  . letrozole (FEMARA) 2.5 MG tablet Take 1 tablet (2.5 mg total) by mouth daily. 90 tablet 3  . Multiple Vitamins-Minerals (OCUVITE ADULT 50+ PO) Take by mouth.    . Multiple Vitamins-Minerals (WOMENS MULTIVITAMIN PLUS PO) Take 1 tablet by mouth daily.      No current facility-administered medications on file prior to visit.     Allergies  Allergen Reactions  . Penicillins Anaphylaxis  . Erythromycin Nausea And Vomiting  . Adhesive  [Tape]     rash  . Amoxicillin     REACTION: unspecified  . Shellfish Allergy     welts  . Sulfamethoxazole Hives    REACTION: unspecified    Past Medical History:  Diagnosis Date  . Arthritis    back  . Breast cancer ([place])    Left Breast - ductal Carcinoma In-Situ - diagnosed on MRI- biopsy  . DIVERTICULOSIS, COLON 04/20/2007  . GERD 04/20/2007  . H/O hiatal hernia   . Heart murmur    told by PCP- years ago-never had echo  . HEPATOMEGALY, HX OF 12/21/2009  . HYPERLIPIDEMIA 04/20/2007  . HYPERTENSION 04/20/2007  . LOW BACK PAIN 08/16/2008  . MENOPAUSAL SYNDROME 04/20/2007  . PEPTIC ULCER DISEASE 09/06/2010  . Personal history of chemotherapy    2015  . Personal history of radiation therapy    2015  . Wears glasses     Past Surgical History:  Procedure Laterality Date  . ABDOMINAL HYSTERECTOMY    . BREAST LUMPECTOMY Left    10/07/13 & 10/25/13  . BREAST LUMPECTOMY WITH NEEDLE LOCALIZATION Left 10/07/2013   Procedure: BREAST LUMPECTOMY WITH NEEDLE LOCALIZATION EXCISION OF BILATERAL AXILLARY SKIN TAGS ;  Surgeon: Edward Jolly, MD;  Location: [facility];  Service: General;  Laterality: Left;  . BREAST SURGERY     30 rounds radiation  .  CARPAL TUNNEL RELEASE Bilateral   . CHOLECYSTECTOMY    . COLONOSCOPY    . RE-EXCISION OF BREAST LUMPECTOMY Left 10/25/2013   Procedure: RE-EXCISION OF BREAST LUMPECTOMY;  Surgeon: Edward Jolly, MD;  Location: [facility];  Service: General;  Laterality: Left;  clean wound class  . TUBAL LIGATION      Family History  Problem Relation Age of Onset  . Hypertension Mother   . Hyperlipidemia Mother   . Heart disease Mother        congestive heart failure  . Congestive Heart Failure Mother   . Cancer Father        hx of melanoma  . Hypertension Sister   . Skin cancer Sister 15       basal cell carcinoma  . Hypertension Brother   . Heart disease Maternal Grandmother 57  . Breast cancer Maternal Aunt 60  . Colon cancer  Paternal Grandmother 82  . Heart disease Maternal Aunt 42  . Prostate cancer Cousin     Social History   Socioeconomic History  . Marital status: Widowed    Spouse name: Not on file  . Number of children: 2  . Years of education: Not on file  . Highest education level: Not on file  Occupational History    Employer: [employer]    Comment: Retired  Scientific laboratory technician  . Financial resource strain: Not on file  . Food insecurity:    Worry: Not on file    Inability: Not on file  . Transportation needs:    Medical: Not on file    Non-medical: Not on file  Tobacco Use  . Smoking status: Former Smoker    Last attempt to quit: 10/21/1978    Years since quitting: 40.0  . Smokeless tobacco: Never Used  Substance and Sexual Activity  . Alcohol use: No  . Drug use: No  . Sexual activity: Not Currently  Lifestyle  . Physical activity:    Days per week: Not on file    Minutes per session: Not on file  . Stress: Not on file  Relationships  . Social connections:    Talks on phone: Not on file    Gets together: Not on file    Attends religious service: Not on file    Active member of club or organization: Not on file    Attends meetings of clubs or organizations: Not on file    Relationship status: Not on file  . Intimate partner violence:    Fear of current or ex partner: Not on file    Emotionally abused: Not on file    Physically abused: Not on file    Forced sexual activity: Not on file  Other Topics Concern  . Not on file  Social History Narrative  . Not on file   Review of Systems Eye problems on the left side Torn cartilage in left shoulder Chip in left hip---had cortisone shot Some knee problems as well    Objective:   Physical Exam  Constitutional: She appears well-developed. No distress.  HENT:  Mouth/Throat: Oropharynx is clear and moist. No oropharyngeal exudate.  Neck: No thyromegaly present.  Cardiovascular: Normal rate, regular rhythm,  normal heart sounds and intact distal pulses. Exam reveals no gallop.  No murmur heard. Respiratory: Effort normal and breath sounds normal. No respiratory distress. She has no wheezes. She has no rales.  GI: Soft. There is no abdominal tenderness.  Musculoskeletal:  General: No tenderness or edema.  Lymphadenopathy:    She has no cervical adenopathy.  Skin: No rash noted. No erythema.  Psychiatric: She has a normal mood and affect. Her behavior is normal.           Assessment & Plan:

## 2018-11-16 DIAGNOSIS — M546 Pain in thoracic spine: Secondary | ICD-10-CM | POA: Diagnosis not present

## 2018-11-20 MED ORDER — HYDROCHLOROTHIAZIDE 25 MG PO TABS: 25.0000 mg | ORAL_TABLET | Freq: Every day | ORAL | 2 refills | Status: DC

## 2018-11-25 DIAGNOSIS — R69 Illness, unspecified: Secondary | ICD-10-CM | POA: Diagnosis not present

## 2018-11-30 DIAGNOSIS — D1801 Hemangioma of skin and subcutaneous tissue: Secondary | ICD-10-CM | POA: Diagnosis not present

## 2018-11-30 DIAGNOSIS — L821 Other seborrheic keratosis: Secondary | ICD-10-CM | POA: Diagnosis not present

## 2018-11-30 DIAGNOSIS — Z85828 Personal history of other malignant neoplasm of skin: Secondary | ICD-10-CM | POA: Diagnosis not present

## 2018-12-15 DIAGNOSIS — M5136 Other intervertebral disc degeneration, lumbar region: Secondary | ICD-10-CM | POA: Diagnosis not present

## 2018-12-15 DIAGNOSIS — M545 Low back pain: Secondary | ICD-10-CM | POA: Diagnosis not present

## 2018-12-23 ENCOUNTER — Other Ambulatory Visit: Payer: Self-pay | Admitting: Sports Medicine

## 2018-12-23 DIAGNOSIS — M545 Low back pain, unspecified: Secondary | ICD-10-CM

## 2018-12-25 DIAGNOSIS — D0512 Intraductal carcinoma in situ of left breast: Secondary | ICD-10-CM | POA: Diagnosis not present

## 2018-12-31 ENCOUNTER — Ambulatory Visit
Admission: RE | Admit: 2018-12-31 | Discharge: 2018-12-31 | Disposition: A | Discharge status: Home / Self Care | Payer: Medicare HMO | Source: Ambulatory Visit | Attending: Sports Medicine | Admitting: Sports Medicine

## 2018-12-31 ENCOUNTER — Other Ambulatory Visit: Payer: Self-pay

## 2018-12-31 DIAGNOSIS — M5136 Other intervertebral disc degeneration, lumbar region: Secondary | ICD-10-CM | POA: Diagnosis not present

## 2018-12-31 DIAGNOSIS — M545 Low back pain, unspecified: Secondary | ICD-10-CM

## 2019-02-03 ENCOUNTER — Encounter: Payer: Medicare HMO | Admitting: Internal Medicine

## 2019-03-23 DIAGNOSIS — R0781 Pleurodynia: Secondary | ICD-10-CM | POA: Diagnosis not present

## 2019-03-23 DIAGNOSIS — M545 Low back pain: Secondary | ICD-10-CM | POA: Diagnosis not present

## 2019-03-31 DIAGNOSIS — M545 Low back pain: Secondary | ICD-10-CM | POA: Diagnosis not present

## 2019-04-05 DIAGNOSIS — M545 Low back pain: Secondary | ICD-10-CM | POA: Diagnosis not present

## 2019-04-08 DIAGNOSIS — M545 Low back pain: Secondary | ICD-10-CM | POA: Diagnosis not present

## 2019-04-12 DIAGNOSIS — M545 Low back pain: Secondary | ICD-10-CM | POA: Diagnosis not present

## 2019-04-15 DIAGNOSIS — M545 Low back pain: Secondary | ICD-10-CM | POA: Diagnosis not present

## 2019-04-19 DIAGNOSIS — M545 Low back pain: Secondary | ICD-10-CM | POA: Diagnosis not present

## 2019-04-29 DIAGNOSIS — M545 Low back pain: Secondary | ICD-10-CM | POA: Diagnosis not present

## 2019-05-05 DIAGNOSIS — M545 Low back pain: Secondary | ICD-10-CM | POA: Diagnosis not present

## 2019-05-12 DIAGNOSIS — M545 Low back pain: Secondary | ICD-10-CM | POA: Diagnosis not present

## 2019-05-17 ENCOUNTER — Other Ambulatory Visit: Payer: Self-pay

## 2019-05-17 ENCOUNTER — Encounter: Payer: Self-pay | Admitting: Hematology and Oncology

## 2019-05-17 DIAGNOSIS — Z1231 Encounter for screening mammogram for malignant neoplasm of breast: Secondary | ICD-10-CM

## 2019-05-19 DIAGNOSIS — M545 Low back pain: Secondary | ICD-10-CM | POA: Diagnosis not present

## 2019-05-20 NOTE — Assessment & Plan Note (Signed)
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 Breast exam 05/28/2018: No concerns on exam 2. Mammograms12/01/2018, benign, breast density category C 3. Bone density December 2019 T score -0.8: Normal bone density. 4.  Breast MRI 05/04/2018: Benign findings 1 year follow-up MRI of the left breast recommended  Patient and her sister take care of her ailing mother age 64 and requires 24/7 home care. This does not give her much time to do exercise. She will try to make some more time to get some activity.  Return to clinic in 1 year for follow-up

## 2019-05-26 ENCOUNTER — Telehealth: Payer: Self-pay | Admitting: Hematology and Oncology

## 2019-05-26 NOTE — Progress Notes (Signed)
HEMATOLOGY-ONCOLOGY MYCHART VIDEO VISIT PROGRESS NOTE  I connected with Katie Becker on 05/27/2019 at 10:15 AM EDT by MyChart video conference and verified that I am speaking with the correct person using two identifiers.  I discussed the limitations, risks, security and privacy concerns of performing an evaluation and management service by MyChart and the availability of in person appointments.  I also discussed with the patient that there may be a patient responsible charge related to this service. The patient expressed understanding and agreed to proceed.  Patient's Location: Home Physician Location: Clinic  CHIEF COMPLIANT: Follow-up of recurrent left breast cancer on letrozole  INTERVAL HISTORY: Katie Becker is a 68 y.o. female with above-mentioned history of recurrent left breast cancer treated with lumpectomy, radiation, and who is currently on antiestrogen therapy with letrozole. I last saw her a year ago. Mammogram on 09/23/18 showed no evidence of malignancy bilaterally. She presents over MyChart today for annual follow-up.  Back problems (disks), stiff back.  Oncology History  Breast cancer of lower-outer quadrant of left female breast ([place])  10/07/2013 Surgery   Left breast lumpectomy: DCIS ER positive PR positive, reexcision of positive margins on 10/25/2013, final margins negative   11/08/2013 - 12/27/2013 Radiation Therapy   Adjuvant radiation therapy   01/03/2014 Procedure   Genetic testing negative for breast and ovarian cancer panels   01/24/2014 - 10/16/2014 Anti-estrogen oral therapy   Tamoxifen 20 mg daily 5 years   09/13/2014 Relapse/Recurrence   MRI of the breast revealed a 3.5 cm area of non-mass enhancement left breast lateral 3.5 x 1.9 x 1.6 cm, oval circumscribed T2 bright enhancing mass right breast upper outer quadrant 1 x 0.7 cm, linear non-mass enhancement 11 x 6 x 3 mm   10/07/2014 Surgery   Left breast lumpectomy: DCIS grade 10.6 cm ER 100%, PR 0%,  multiple skin tags benign fibroepithelial polyps, reexcision 10/25/2013 benign fibrocystic changes with calcifications   12/27/2014 -  Anti-estrogen oral therapy   Anastrozole 1 mg daily changed to letrozole 2.5 mg daily 01/05/2017due to musculoskeletal aches and pains     REVIEW OF SYSTEMS:   Constitutional: Denies fevers, chills or abnormal weight loss Eyes: Denies blurriness of vision Ears, nose, mouth, throat, and face: Denies mucositis or sore throat Respiratory: Denies cough, dyspnea or wheezes Cardiovascular: Denies palpitation, chest discomfort Gastrointestinal:  Denies nausea, heartburn or change in bowel habits Skin: Denies abnormal skin rashes Lymphatics: Denies new lymphadenopathy or easy bruising Neurological:Denies numbness, tingling or new weaknesses Behavioral/Psych: Mood is stable, no new changes  Extremities: No lower extremity edema Breast: denies any pain or lumps or nodules in either breasts All other systems were reviewed with the patient and are negative.  Observations/Objective:  Weight: 176.6 BP 130/67 PR 73 Afebrile  There were no vitals filed for this visit. There is no height or weight on file to calculate BMI.  I have reviewed the data as listed CMP Latest Ref Rng & Units 10/20/2018 10/06/2017 09/25/2016  Glucose 70 - 99 mg/dL 123(H) 108(H) 110(H)  BUN 6 - 23 mg/dL 13 13 14   Creatinine 0.40 - 1.20 mg/dL 0.90 0.93 0.96  Sodium 135 - 145 mEq/L 141 141 143  Potassium 3.5 - 5.1 mEq/L 3.8 3.8 3.5  Chloride 96 - 112 mEq/L 103 99 103  CO2 19 - 32 mEq/L 30 33(H) 31  Calcium 8.4 - 10.5 mg/dL 9.7 10.2 9.8  Total Protein 6.0 - 8.3 g/dL 7.1 7.3 6.8  Total Bilirubin 0.2 - 1.2 mg/dL 0.5  0.8 0.8  Alkaline Phos 39 - 117 U/L 99 100 107  AST 0 - 37 U/L 37 43(H) 28  ALT 0 - 35 U/L 45(H) 50(H) 31    Lab Results  Component Value Date   WBC 6.5 10/20/2018   HGB 14.8 10/20/2018   HCT 43.7 10/20/2018   MCV 85.7 10/20/2018   PLT 213.0 10/20/2018   NEUTROABS 4.4  10/06/2017      Assessment Plan:  Breast cancer of lower-outer quadrant of left female breast 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 Breast exam 05/28/2018: No concerns on exam 2. Mammograms12/01/2018, benign, breast density category C 3. Bone density December 2019 T score -0.8: Normal bone density. 4.  Breast MRI 05/04/2018: Benign findings 1 year follow-up MRI of the left breast recommended  Return to clinic in 1 year for follow-up  I discussed the assessment and treatment plan with the patient. The patient was provided an opportunity to ask questions and all were answered. The patient agreed with the plan and demonstrated an understanding of the instructions. The patient was advised to call back or seek an in-person evaluation if the symptoms worsen or if the condition fails to improve as anticipated.   I provided 15 minutes of face-to-face MyChart video visit time during this encounter.    Rulon Eisenmenger, MD 05/27/2019   I, Molly Dorshimer, am acting as scribe for Nicholas Lose, MD.  I have reviewed the above documentation for accuracy and completeness, and I agree with the above.

## 2019-05-26 NOTE — Telephone Encounter (Signed)
Contacted patient to verify telephone/webex visit for pre reg

## 2019-05-27 ENCOUNTER — Telehealth: Payer: Self-pay | Admitting: Hematology and Oncology

## 2019-05-27 ENCOUNTER — Inpatient Hospital Stay: Payer: Medicare HMO | Attending: Hematology and Oncology | Admitting: Hematology and Oncology

## 2019-05-27 DIAGNOSIS — Z170 Estrogen receptor positive status [ER+]: Secondary | ICD-10-CM | POA: Diagnosis not present

## 2019-05-27 DIAGNOSIS — Z923 Personal history of irradiation: Secondary | ICD-10-CM | POA: Insufficient documentation

## 2019-05-27 DIAGNOSIS — Z79811 Long term (current) use of aromatase inhibitors: Secondary | ICD-10-CM | POA: Insufficient documentation

## 2019-05-27 DIAGNOSIS — C50512 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: Secondary | ICD-10-CM | POA: Diagnosis not present

## 2019-05-27 MED ORDER — LETROZOLE 2.5 MG PO TABS: 2.5000 mg | ORAL_TABLET | Freq: Every day | ORAL | 3 refills | Status: DC

## 2019-05-27 NOTE — Telephone Encounter (Signed)
I talk with patient regarding schedule  

## 2019-05-28 ENCOUNTER — Ambulatory Visit (INDEPENDENT_AMBULATORY_CARE_PROVIDER_SITE_OTHER): Payer: Medicare HMO | Admitting: Internal Medicine

## 2019-05-28 ENCOUNTER — Encounter: Payer: Self-pay | Admitting: Internal Medicine

## 2019-05-28 ENCOUNTER — Other Ambulatory Visit: Payer: Self-pay

## 2019-05-28 DIAGNOSIS — L918 Other hypertrophic disorders of the skin: Secondary | ICD-10-CM | POA: Diagnosis not present

## 2019-05-28 NOTE — Assessment & Plan Note (Signed)
Discussed alternatives Verbal consent Liquid nitrogen 30 seconds x 2 ---tolerated well Discussed home care

## 2019-05-28 NOTE — Progress Notes (Signed)
Subjective:    Patient ID: Katie Becker, female    DOB: 11-Jan-1951, 68 y.o.   MRN: [id number]  HPI Here due to skin tag with pain Left upper thigh is now painful Has been sitting more--abdomen may be pushing against it  Current Outpatient Medications on File Prior to Visit  Medication Sig Dispense Refill  . atorvastatin (LIPITOR) 40 MG tablet Take 1 tablet (40 mg total) by mouth daily. 90 tablet 3  . Calcium Carbonate-Vitamin D (CALCIUM + D PO) Take 1 tablet by mouth 2 (two) times daily before a meal.     . cetirizine (ZYRTEC) 10 MG tablet Take 10 mg by mouth daily as needed for allergies.    Marland Kitchen esomeprazole (NEXIUM) 40 MG capsule Take 1 capsule (40 mg total) by mouth daily. 90 capsule 3  . hydrochlorothiazide (HYDRODIURIL) 25 MG tablet Take 1 tablet (25 mg total) by mouth daily. 90 tablet 2  . letrozole (FEMARA) 2.5 MG tablet Take 1 tablet (2.5 mg total) by mouth daily. 90 tablet 3  . Multiple Vitamins-Minerals (OCUVITE ADULT 50+ PO) Take by mouth.    . Multiple Vitamins-Minerals (WOMENS MULTIVITAMIN PLUS PO) Take 1 tablet by mouth daily.      No current facility-administered medications on file prior to visit.     Allergies  Allergen Reactions  . Penicillins Anaphylaxis  . Erythromycin Nausea And Vomiting  . Adhesive [Tape]     rash  . Amoxicillin     REACTION: unspecified  . Shellfish Allergy     welts  . Sulfamethoxazole Hives    REACTION: unspecified    Past Medical History:  Diagnosis Date  . Arthritis    back  . 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 DIVERTICULOSIS, COLON 04/20/2007  . GERD 04/20/2007  . H/O hiatal hernia   . Heart murmur    told by PCP- years ago-never had echo  . HEPATOMEGALY, HX OF 12/21/2009  . HYPERLIPIDEMIA 04/20/2007  . HYPERTENSION 04/20/2007  . LOW BACK PAIN 08/16/2008  . MENOPAUSAL SYNDROME 04/20/2007  . PEPTIC ULCER DISEASE 09/06/2010  . Personal history of chemotherapy    2015   . Personal history of radiation therapy    2015  . Wears glasses     Past Surgical History:  Procedure Laterality Date  . ABDOMINAL HYSTERECTOMY    . BREAST LUMPECTOMY Left    10/07/13 & 10/25/13  . BREAST LUMPECTOMY WITH NEEDLE LOCALIZATION Left 10/07/2013   Procedure: BREAST LUMPECTOMY WITH NEEDLE LOCALIZATION EXCISION OF BILATERAL AXILLARY SKIN TAGS ;  Surgeon: Edward Jolly, MD;  Location: [facility];  Service: General;  Laterality: Left;  . BREAST SURGERY     30 rounds radiation  . CARPAL TUNNEL RELEASE Bilateral   . CHOLECYSTECTOMY    . COLONOSCOPY    . RE-EXCISION OF BREAST LUMPECTOMY Left 10/25/2013   Procedure: RE-EXCISION OF BREAST LUMPECTOMY;  Surgeon: Edward Jolly, MD;  Location: [facility];  Service: General;  Laterality: Left;  clean wound class  . TUBAL LIGATION      Family History  Problem Relation Age of Onset  . Hypertension Mother   . Hyperlipidemia Mother   . Heart disease Mother        congestive heart failure  . Congestive Heart Failure Mother   . Cancer Father        hx of melanoma  . Hypertension Sister   . Skin cancer  Sister 68       basal cell carcinoma  . Hypertension Brother   . Heart disease Maternal Grandmother 70  . Breast cancer Maternal Aunt 60  . Colon cancer Paternal Grandmother 59  . Heart disease Maternal Aunt 42  . Prostate cancer Cousin     Social History   Socioeconomic History  . Marital status: Widowed    Spouse name: Not on file  . Number of children: 2  . Years of education: Not on file  . Highest education level: Not on file  Occupational History    Employer: [employer]    Comment: Retired  Scientific laboratory technician  . Financial resource strain: Not on file  . Food insecurity    Worry: Not on file    Inability: Not on file  . Transportation needs    Medical: Not on file    Non-medical: Not on file  Tobacco Use  . Smoking status: Former Smoker    Quit date: 10/21/1978    Years  since quitting: 40.6  . Smokeless tobacco: Never Used  Substance and Sexual Activity  . Alcohol use: No  . Drug use: No  . Sexual activity: Not Currently  Lifestyle  . Physical activity    Days per week: Not on file    Minutes per session: Not on file  . Stress: Not on file  Relationships  . Social Herbalist on phone: Not on file    Gets together: Not on file    Attends religious service: Not on file    Active member of club or organization: Not on file    Attends meetings of clubs or organizations: Not on file    Relationship status: Not on file  . Intimate partner violence    Fear of current or ex partner: Not on file    Emotionally abused: Not on file    Physically abused: Not on file    Forced sexual activity: Not on file  Other Topics Concern  . 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

## 2019-06-08 ENCOUNTER — Ambulatory Visit (HOSPITAL_COMMUNITY): Admission: RE | Admit: 2019-06-08 | Payer: Medicare HMO | Source: Ambulatory Visit

## 2019-06-08 ENCOUNTER — Encounter (HOSPITAL_COMMUNITY): Payer: Self-pay

## 2019-06-15 ENCOUNTER — Ambulatory Visit (HOSPITAL_COMMUNITY)
Admission: RE | Admit: 2019-06-15 | Discharge: 2019-06-15 | Disposition: A | Discharge status: Home / Self Care | Payer: Medicare HMO | Source: Ambulatory Visit | Attending: Hematology and Oncology | Admitting: Hematology and Oncology

## 2019-06-15 ENCOUNTER — Other Ambulatory Visit: Payer: Self-pay

## 2019-06-15 DIAGNOSIS — N6313 Unspecified lump in the right breast, lower outer quadrant: Secondary | ICD-10-CM | POA: Diagnosis not present

## 2019-06-15 DIAGNOSIS — Z1231 Encounter for screening mammogram for malignant neoplasm of breast: Secondary | ICD-10-CM | POA: Insufficient documentation

## 2019-06-15 DIAGNOSIS — N6324 Unspecified lump in the left breast, lower inner quadrant: Secondary | ICD-10-CM | POA: Diagnosis not present

## 2019-06-15 LAB — POCT I-STAT CREATININE: Creatinine, Ser: 0.8 mg/dL (ref 0.44–1.00)

## 2019-06-15 MED ORDER — GADOBUTROL 1 MMOL/ML IV SOLN
8.0000 mL | Freq: Once | INTRAVENOUS | Status: AC | PRN
  Administered 2019-06-15: 8 mL via INTRAVENOUS

## 2019-06-16 ENCOUNTER — Telehealth: Payer: Self-pay | Admitting: *Deleted

## 2019-06-16 DIAGNOSIS — R69 Illness, unspecified: Secondary | ICD-10-CM | POA: Diagnosis not present

## 2019-06-16 DIAGNOSIS — C50512 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: Secondary | ICD-10-CM

## 2019-06-16 NOTE — Telephone Encounter (Signed)
Called pt with MRI results. Discussed recommendations of left [place] and possible biopsy. Received verbal understanding. Orders placed. [facility] called with appt request.

## 2019-06-22 ENCOUNTER — Ambulatory Visit
Admission: RE | Admit: 2019-06-22 | Discharge: 2019-06-22 | Disposition: A | Discharge status: Home / Self Care | Payer: Medicare HMO | Source: Ambulatory Visit | Attending: Hematology and Oncology | Admitting: Hematology and Oncology

## 2019-06-22 ENCOUNTER — Other Ambulatory Visit: Payer: Self-pay

## 2019-06-22 DIAGNOSIS — C50512 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: Secondary | ICD-10-CM

## 2019-06-22 DIAGNOSIS — Z170 Estrogen receptor positive status [ER+]: Secondary | ICD-10-CM

## 2019-06-22 DIAGNOSIS — N6489 Other specified disorders of breast: Secondary | ICD-10-CM | POA: Diagnosis not present

## 2019-07-07 ENCOUNTER — Encounter: Payer: Self-pay | Admitting: Internal Medicine

## 2019-07-07 ENCOUNTER — Ambulatory Visit (INDEPENDENT_AMBULATORY_CARE_PROVIDER_SITE_OTHER): Payer: Medicare HMO | Admitting: Internal Medicine

## 2019-07-07 ENCOUNTER — Other Ambulatory Visit: Payer: Self-pay

## 2019-07-07 VITALS — BP 124/74 | HR 82 | Temp 98.2°F | Ht 62.0 in | Wt 175.0 lb

## 2019-07-07 DIAGNOSIS — Z7189 Other specified counseling: Secondary | ICD-10-CM | POA: Diagnosis not present

## 2019-07-07 DIAGNOSIS — M545 Low back pain: Secondary | ICD-10-CM | POA: Diagnosis not present

## 2019-07-07 DIAGNOSIS — Z23 Encounter for immunization: Secondary | ICD-10-CM | POA: Diagnosis not present

## 2019-07-07 DIAGNOSIS — C50512 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: Secondary | ICD-10-CM | POA: Diagnosis not present

## 2019-07-07 DIAGNOSIS — K219 Gastro-esophageal reflux disease without esophagitis: Secondary | ICD-10-CM | POA: Diagnosis not present

## 2019-07-07 DIAGNOSIS — G8929 Other chronic pain: Secondary | ICD-10-CM | POA: Diagnosis not present

## 2019-07-07 DIAGNOSIS — Z0000 Encounter for general adult medical examination without abnormal findings: Secondary | ICD-10-CM | POA: Diagnosis not present

## 2019-07-07 DIAGNOSIS — I10 Essential (primary) hypertension: Secondary | ICD-10-CM | POA: Diagnosis not present

## 2019-07-07 LAB — COMPREHENSIVE METABOLIC PANEL
ALT: 65 U/L — ABNORMAL HIGH (ref 0–35)
AST: 75 U/L — ABNORMAL HIGH (ref 0–37)
Albumin: 4.4 g/dL (ref 3.5–5.2)
Alkaline Phosphatase: 100 U/L (ref 39–117)
BUN: 15 mg/dL (ref 6–23)
CO2: 31 mEq/L (ref 19–32)
Calcium: 10.2 mg/dL (ref 8.4–10.5)
Chloride: 100 mEq/L (ref 96–112)
Creatinine, Ser: 0.88 mg/dL (ref 0.40–1.20)
GFR: 64.21 mL/min (ref >60.00)
Glucose, Bld: 115 mg/dL — ABNORMAL HIGH (ref 70–99)
Potassium: 4.1 mEq/L (ref 3.5–5.1)
Sodium: 141 mEq/L (ref 135–145)
Total Bilirubin: 0.7 mg/dL (ref 0.2–1.2)
Total Protein: 7.4 g/dL (ref 6.0–8.3)

## 2019-07-07 LAB — CBC
HCT: 43.4 % (ref 36.0–46.0)
Hemoglobin: 14.6 g/dL (ref 12.0–15.0)
MCHC: 33.6 g/dL (ref 30.0–36.0)
MCV: 85.4 fl (ref 78.0–100.0)
Platelets: 223 10*3/uL (ref 150.0–400.0)
RBC: 5.08 Mil/uL (ref 3.87–5.11)
RDW: 13.1 % (ref 11.5–15.5)
WBC: 6.8 10*3/uL (ref 4.0–10.5)

## 2019-07-07 LAB — LIPID PANEL
Cholesterol: 183 mg/dL (ref 0–200)
HDL: 47.5 mg/dL (ref >39.00)
LDL Cholesterol: 106 mg/dL — ABNORMAL HIGH (ref 0–99)
NonHDL: 135.75
Total CHOL/HDL Ratio: 4
Triglycerides: 147 mg/dL (ref 0.0–149.0)
VLDL: 29.4 mg/dL (ref 0.0–40.0)

## 2019-07-07 LAB — T4, FREE: Free T4: 0.8 ng/dL (ref 0.60–1.60)

## 2019-07-07 NOTE — Assessment & Plan Note (Signed)
Chronic but not severe Discussed tylenol prn

## 2019-07-07 NOTE — Progress Notes (Signed)
Hearing Screening   Method: Audiometry   125Hz 250Hz 500Hz 1000Hz 2000Hz 3000Hz 4000Hz 6000Hz 8000Hz  Right ear:   20 20 20  20    Left ear:   20 20 20  20      Visual Acuity Screening   Right eye Left eye Both eyes  Without correction:     With correction: 20/15 20/15 20/15    

## 2019-07-07 NOTE — Addendum Note (Signed)
Addended by: Pilar Grammes on: 07/07/2019 12:54 PM   Modules accepted: Orders

## 2019-07-07 NOTE — Assessment & Plan Note (Signed)
See social history 

## 2019-07-07 NOTE — Assessment & Plan Note (Signed)
Didn't tolerate skipping doses

## 2019-07-07 NOTE — Assessment & Plan Note (Signed)
BP Readings from Last 3 Encounters:  07/07/19 124/74  05/28/19 124/66  10/20/18 136/76   Good control

## 2019-07-07 NOTE — Addendum Note (Signed)
Addended by: Viviana Simpler I on: 07/07/2019 11:25 AM   Modules accepted: Orders

## 2019-07-07 NOTE — Progress Notes (Signed)
Subjective:    Patient ID: Katie Becker, female    DOB: [birth year], 68 y.o.   MRN: [id number]  HPI Here for Medicare wellness visit and follow up of chronic health conditions Reviewed form and advanced directives Reviewed other doctors No alcohol or tobacco Tries to exercise regularly [place] once--hit couch and "jilted" shoulder and back No depression or anhedonia--just stress Vision and hearing are fine Independent with instrumental ADLs No troubling memory problems  Keeps up with oncologist Continues on letrozole Had recent scare about this with MRI--but ultrasound was reassuring  Seeing orthopedist Widespread arthritis Some disc disease in back (lumbar). Can get some mild sensory changes in her feet at times Gets stiff after being in one place for a while Has tramadol for rare prn use Even reluctant to take tylenol  No chest pain No SOB--but needs to take a big breath at times (relates to allergies) No palpitations No dizziness or syncope Mild edema--only with increased salt intake  Continues on PPI Past bleeding ulcers plus reflux No dysphagia Didn't tolerate every other day  Current Outpatient Medications on File Prior to Visit  Medication Sig Dispense Refill  . atorvastatin (LIPITOR) 40 MG tablet Take 1 tablet (40 mg total) by mouth daily. 90 tablet 3  . Calcium Carbonate-Vitamin D (CALCIUM + D PO) Take 1 tablet by mouth 2 (two) times daily before a meal.     . cetirizine (ZYRTEC) 10 MG tablet Take 10 mg by mouth daily as needed for allergies.    Marland Kitchen esomeprazole (NEXIUM) 40 MG capsule Take 1 capsule (40 mg total) by mouth daily. 90 capsule 3  . hydrochlorothiazide (HYDRODIURIL) 25 MG tablet Take 1 tablet (25 mg total) by mouth daily. 90 tablet 2  . letrozole (FEMARA) 2.5 MG tablet Take 1 tablet (2.5 mg total) by mouth daily. 90 tablet 3  . Multiple Vitamins-Minerals (OCUVITE ADULT 50+ PO) Take by mouth.    . Multiple Vitamins-Minerals (WOMENS MULTIVITAMIN PLUS PO)  Take 1 tablet by mouth daily.      No current facility-administered medications on file prior to visit.     Allergies  Allergen Reactions  . Penicillins Anaphylaxis  . Erythromycin Nausea And Vomiting  . Adhesive [Tape]     rash  . Amoxicillin     REACTION: unspecified  . Shellfish Allergy     welts  . Sulfamethoxazole Hives    REACTION: unspecified    Past Medical History:  Diagnosis Date  . Arthritis    back  . 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 DIVERTICULOSIS, COLON 04/20/2007  . GERD 04/20/2007  . H/O hiatal hernia   . Heart murmur    told by PCP- years ago-never had echo  . HEPATOMEGALY, HX OF 12/21/2009  . HYPERLIPIDEMIA 04/20/2007  . HYPERTENSION 04/20/2007  . LOW BACK PAIN 08/16/2008  . MENOPAUSAL SYNDROME 04/20/2007  . PEPTIC ULCER DISEASE 09/06/2010  . Personal history of chemotherapy    2015  . Personal history of radiation therapy    2015  . Wears glasses     Past Surgical History:  Procedure Laterality Date  . ABDOMINAL HYSTERECTOMY    . BREAST LUMPECTOMY Left    10/07/13 & 10/25/13  . BREAST LUMPECTOMY WITH NEEDLE LOCALIZATION Left 10/07/2013   Procedure: BREAST LUMPECTOMY WITH NEEDLE LOCALIZATION EXCISION OF BILATERAL AXILLARY SKIN TAGS ;  Surgeon: Edward Jolly, MD;  Location: [place];  Service: General;  Laterality:  Left;  . BREAST SURGERY     30 rounds radiation  . CARPAL TUNNEL RELEASE Bilateral   . CHOLECYSTECTOMY    . COLONOSCOPY    . RE-EXCISION OF BREAST LUMPECTOMY Left 10/25/2013   Procedure: RE-EXCISION OF BREAST LUMPECTOMY;  Surgeon: Edward Jolly, MD;  [facility];  Service: General;  Laterality: Left;  clean wound class  . TUBAL LIGATION      Family History  Problem Relation Age of Onset  . Hypertension Mother   . Hyperlipidemia Mother   . Heart disease Mother        congestive heart failure  . Congestive Heart Failure Mother   . Cancer Father         hx of melanoma  . Hypertension Sister   . Skin cancer Sister 22       basal cell carcinoma  . Hypertension Brother   . Heart disease Maternal Grandmother 47  . Breast cancer Maternal Aunt 60  . Colon cancer Paternal Grandmother 60  . Heart disease Maternal Aunt 42  . Prostate cancer Cousin     Social History   Socioeconomic History  . Marital status: Widowed    Spouse name: Not on file  . Number of children: 2  . Years of education: Not on file  . Highest education level: Not on file  Occupational History    Employer: [employer]    Comment: Retired  Scientific laboratory technician  . Financial resource strain: Not on file  . Food insecurity    Worry: Not on file    Inability: Not on file  . Transportation needs    Medical: Not on file    Non-medical: Not on file  Tobacco Use  . Smoking status: Former Smoker    Quit date: 10/21/1978    Years since quitting: 40.7  . Smokeless tobacco: Never Used  Substance and Sexual Activity  . Alcohol use: No  . Drug use: No  . Sexual activity: Not Currently  Lifestyle  . Physical activity    Days per week: Not on file    Minutes per session: Not on file  . Stress: Not on file  Relationships  . Social Herbalist on phone: Not on file    Gets together: Not on file    Attends religious service: Not on file    Active member of club or organization: Not on file    Attends meetings of clubs or organizations: Not on file    Relationship status: Not on file  . Intimate partner violence    Fear of current or ex partner: Not on file    Emotionally abused: Not on file    Physically abused: Not on file    Forced sexual activity: Not on file  Other Topics Concern  . 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 "My mind is my problem" Does have restorative naps  Trying to eat healthy --but not able to go to gym. Does have exercise bike Weight is stable Found spot on the top of her head---wants that checked Teeth okay---keeps up with dentist Bowels are fine--no blood No other  skin lesions    Objective:   Physical Exam  Constitutional: She is oriented to person, place, and time. She appears well-developed. No distress.  HENT:  Mouth/Throat: Oropharynx is clear and moist. No oropharyngeal exudate.  Neck: No thyromegaly present.  Cardiovascular: Normal rate, regular rhythm, normal heart sounds and intact distal pulses. Exam reveals no gallop.  No murmur heard. Respiratory: Effort normal and breath sounds normal. No respiratory distress. She has no wheezes. She has no rales.  GI: Soft. There is no abdominal tenderness.  Musculoskeletal:        General: No tenderness or edema.  Lymphadenopathy:    She has no cervical adenopathy.  Neurological: She is alert and oriented to person, place, and time.  President---"Trump, Ramonita Lab" [id number] Recall 2/3  Skin:  ?irritated hair follicle on top of head--nothing worrisome  Psychiatric: She has a normal mood and affect. Her behavior is normal.           Assessment & Plan:

## 2019-07-07 NOTE — Assessment & Plan Note (Signed)
Recent scare Continues on letrozole

## 2019-07-07 NOTE — Assessment & Plan Note (Signed)
I have personally reviewed the Medicare Annual Wellness questionnaire and have noted 1. The patient's medical and social history 2. Their use of alcohol, tobacco or illicit drugs 3. Their current medications and supplements 4. The patient's functional ability including ADL's, fall risks, home safety risks and hearing or visual             impairment. 5. Diet and physical activities 6. Evidence for depression or mood disorders  The patients weight, height, BMI and visual acuity have been recorded in the chart I have made referrals, counseling and provided education to the patient based review of the above and I have provided the pt with a written personalized care plan for preventive services.  I have provided you with a copy of your personalized plan for preventive services. Please take the time to review along with your updated medication list.  Discussed lifestyle Pneumovax and flu vaccines today Colon due 2026 Regular mammograms Discussed paps--she is going to continue for now

## 2019-08-10 ENCOUNTER — Other Ambulatory Visit: Payer: Self-pay | Admitting: Hematology and Oncology

## 2019-08-10 DIAGNOSIS — Z1231 Encounter for screening mammogram for malignant neoplasm of breast: Secondary | ICD-10-CM

## 2019-08-31 DIAGNOSIS — M25511 Pain in right shoulder: Secondary | ICD-10-CM | POA: Diagnosis not present

## 2019-09-22 DIAGNOSIS — H524 Presbyopia: Secondary | ICD-10-CM | POA: Diagnosis not present

## 2019-09-22 DIAGNOSIS — Z0100 Encounter for examination of eyes and vision without abnormal findings: Secondary | ICD-10-CM | POA: Diagnosis not present

## 2019-09-23 DIAGNOSIS — E785 Hyperlipidemia, unspecified: Secondary | ICD-10-CM | POA: Diagnosis not present

## 2019-09-23 DIAGNOSIS — M1990 Unspecified osteoarthritis, unspecified site: Secondary | ICD-10-CM | POA: Diagnosis not present

## 2019-09-23 DIAGNOSIS — I10 Essential (primary) hypertension: Secondary | ICD-10-CM | POA: Diagnosis not present

## 2019-09-23 DIAGNOSIS — G8929 Other chronic pain: Secondary | ICD-10-CM | POA: Diagnosis not present

## 2019-09-23 DIAGNOSIS — J309 Allergic rhinitis, unspecified: Secondary | ICD-10-CM | POA: Diagnosis not present

## 2019-09-23 DIAGNOSIS — K219 Gastro-esophageal reflux disease without esophagitis: Secondary | ICD-10-CM | POA: Diagnosis not present

## 2019-09-23 DIAGNOSIS — C50919 Malignant neoplasm of unspecified site of unspecified female breast: Secondary | ICD-10-CM | POA: Diagnosis not present

## 2019-09-23 DIAGNOSIS — E669 Obesity, unspecified: Secondary | ICD-10-CM | POA: Diagnosis not present

## 2019-09-23 DIAGNOSIS — Z79811 Long term (current) use of aromatase inhibitors: Secondary | ICD-10-CM | POA: Diagnosis not present

## 2019-09-23 DIAGNOSIS — Z6831 Body mass index (BMI) 31.0-31.9, adult: Secondary | ICD-10-CM | POA: Diagnosis not present

## 2019-09-24 ENCOUNTER — Other Ambulatory Visit: Payer: Self-pay | Admitting: Internal Medicine

## 2019-09-30 ENCOUNTER — Other Ambulatory Visit: Payer: Self-pay

## 2019-09-30 ENCOUNTER — Other Ambulatory Visit: Payer: Self-pay | Admitting: Hematology and Oncology

## 2019-09-30 ENCOUNTER — Ambulatory Visit
Admission: RE | Admit: 2019-09-30 | Discharge: 2019-09-30 | Disposition: A | Discharge status: Home / Self Care | Payer: Medicare HMO | Source: Ambulatory Visit | Attending: Hematology and Oncology | Admitting: Hematology and Oncology

## 2019-09-30 DIAGNOSIS — Z1231 Encounter for screening mammogram for malignant neoplasm of breast: Secondary | ICD-10-CM

## 2019-09-30 DIAGNOSIS — N644 Mastodynia: Secondary | ICD-10-CM

## 2019-10-07 ENCOUNTER — Other Ambulatory Visit: Payer: Self-pay

## 2019-10-07 ENCOUNTER — Ambulatory Visit
Admission: RE | Admit: 2019-10-07 | Discharge: 2019-10-07 | Disposition: A | Discharge status: Home / Self Care | Payer: Medicare HMO | Source: Ambulatory Visit | Attending: Hematology and Oncology | Admitting: Hematology and Oncology

## 2019-10-07 DIAGNOSIS — N644 Mastodynia: Secondary | ICD-10-CM

## 2019-10-07 DIAGNOSIS — N6489 Other specified disorders of breast: Secondary | ICD-10-CM | POA: Diagnosis not present

## 2019-10-07 DIAGNOSIS — R922 Inconclusive mammogram: Secondary | ICD-10-CM | POA: Diagnosis not present

## 2019-10-07 DIAGNOSIS — R921 Mammographic calcification found on diagnostic imaging of breast: Secondary | ICD-10-CM | POA: Diagnosis not present

## 2019-10-18 DIAGNOSIS — H01024 Squamous blepharitis left upper eyelid: Secondary | ICD-10-CM | POA: Diagnosis not present

## 2019-10-21 ENCOUNTER — Other Ambulatory Visit: Payer: Self-pay

## 2019-10-21 MED ORDER — ESOMEPRAZOLE MAGNESIUM 40 MG PO CPDR: 40.0000 mg | DELAYED_RELEASE_CAPSULE | Freq: Every day | ORAL | 3 refills | Status: DC

## 2019-10-21 MED ORDER — HYDROCHLOROTHIAZIDE 25 MG PO TABS: 25.0000 mg | ORAL_TABLET | Freq: Every day | ORAL | 3 refills | Status: DC

## 2019-10-21 NOTE — Telephone Encounter (Signed)
Rx for Nexium sent to [facility] at pt's request.

## 2019-12-22 DIAGNOSIS — R69 Illness, unspecified: Secondary | ICD-10-CM | POA: Diagnosis not present

## 2019-12-24 DIAGNOSIS — D0512 Intraductal carcinoma in situ of left breast: Secondary | ICD-10-CM | POA: Diagnosis not present

## 2020-03-03 ENCOUNTER — Encounter: Payer: Self-pay | Admitting: Hematology and Oncology

## 2020-03-07 DIAGNOSIS — M546 Pain in thoracic spine: Secondary | ICD-10-CM | POA: Diagnosis not present

## 2020-04-06 DIAGNOSIS — C50912 Malignant neoplasm of unspecified site of left female breast: Secondary | ICD-10-CM | POA: Diagnosis not present

## 2020-04-06 DIAGNOSIS — K219 Gastro-esophageal reflux disease without esophagitis: Secondary | ICD-10-CM | POA: Diagnosis not present

## 2020-04-06 DIAGNOSIS — G8929 Other chronic pain: Secondary | ICD-10-CM | POA: Diagnosis not present

## 2020-04-06 DIAGNOSIS — E669 Obesity, unspecified: Secondary | ICD-10-CM | POA: Diagnosis not present

## 2020-04-06 DIAGNOSIS — Z79811 Long term (current) use of aromatase inhibitors: Secondary | ICD-10-CM | POA: Diagnosis not present

## 2020-04-06 DIAGNOSIS — I10 Essential (primary) hypertension: Secondary | ICD-10-CM | POA: Diagnosis not present

## 2020-04-06 DIAGNOSIS — Z803 Family history of malignant neoplasm of breast: Secondary | ICD-10-CM | POA: Diagnosis not present

## 2020-04-06 DIAGNOSIS — M1990 Unspecified osteoarthritis, unspecified site: Secondary | ICD-10-CM | POA: Diagnosis not present

## 2020-04-06 DIAGNOSIS — Z79891 Long term (current) use of opiate analgesic: Secondary | ICD-10-CM | POA: Diagnosis not present

## 2020-04-06 DIAGNOSIS — E785 Hyperlipidemia, unspecified: Secondary | ICD-10-CM | POA: Diagnosis not present

## 2020-05-05 DIAGNOSIS — Z01419 Encounter for gynecological examination (general) (routine) without abnormal findings: Secondary | ICD-10-CM | POA: Diagnosis not present

## 2020-05-08 ENCOUNTER — Other Ambulatory Visit: Payer: Self-pay

## 2020-05-08 ENCOUNTER — Encounter: Payer: Self-pay | Admitting: Family Medicine

## 2020-05-08 ENCOUNTER — Ambulatory Visit (INDEPENDENT_AMBULATORY_CARE_PROVIDER_SITE_OTHER)
Admission: RE | Admit: 2020-05-08 | Discharge: 2020-05-08 | Disposition: A | Discharge status: Home / Self Care | Payer: Medicare HMO | Source: Ambulatory Visit | Attending: Family Medicine | Admitting: Family Medicine

## 2020-05-08 ENCOUNTER — Telehealth: Payer: Self-pay

## 2020-05-08 ENCOUNTER — Ambulatory Visit (INDEPENDENT_AMBULATORY_CARE_PROVIDER_SITE_OTHER): Payer: Medicare HMO | Admitting: Family Medicine

## 2020-05-08 DIAGNOSIS — M7731 Calcaneal spur, right foot: Secondary | ICD-10-CM | POA: Diagnosis not present

## 2020-05-08 DIAGNOSIS — M79674 Pain in right toe(s): Secondary | ICD-10-CM

## 2020-05-08 DIAGNOSIS — M7989 Other specified soft tissue disorders: Secondary | ICD-10-CM | POA: Diagnosis not present

## 2020-05-08 NOTE — Assessment & Plan Note (Signed)
With erythema and swelling in pt with high pain tolerance Exam is consistent with gout , and no specific trauma history  She has used naproxen prn (caution with h/o GI issues) Does not feel like she needs addnl tx (suggested trial of diclofenac gel)  Takes hctz and may not be well hydrated Will double water intake Lab today incl bmet and cbc and uric acid Xray of foot to look for erosive joint change  Handouts given re: gout and low purine diet

## 2020-05-08 NOTE — Patient Instructions (Addendum)
Drink more water (double your intake if you can)   Your toe resembles gout today  Lab today for cbc and uric acid   Also xray   Easy with naproxen in light of your GI situation -better to avoid  voltaren gel over the counter may help instead    Elevate your foot  Ice may be helpful  So can soaks   Update if not starting to improve in a week or if worsening

## 2020-05-08 NOTE — Progress Notes (Signed)
Subjective:    Patient ID: Katie Becker, female    DOB: 1951/05/09, 69 y.o.   MRN: [id number]  This visit occurred during the SARS-CoV-2 public health emergency.  Safety protocols were in place, including screening questions prior to the visit, additional usage of staff PPE, and extensive cleaning of exam room while observing appropriate contact time as indicated for disinfecting solutions.    HPI 69 yo pt of Dr Silvio Pate presents with R foot and toe swelling   Wt Readings from Last 3 Encounters:  05/08/20 173 lb 8 oz (78.7 kg)  07/07/19 175 lb (79.4 kg)  05/28/19 176 lb (79.8 kg)   31.73 kg/m  Symptoms started sat am  Has never had gout in the past  Great toe pain/swelling and redness  Hurts to walk on it and to press on it   She has a high pain tolerance   She soaked in epsom salts and baking soda   She takes hctz for HTN BP Readings from Last 3 Encounters:  05/08/20 (!) 148/88  07/07/19 124/74  05/28/19 124/66    She occ takes naproxen (carefully)   She may have eaten too much sugar lately  No aged cheeses  Not often- cured meats  No shellfish     She had a toe spasm one day (happens when she does not drink enough water)  Her K tends to stay borderline   She does have arthritis   Patient Active Problem List   Diagnosis Date Noted  . Great toe pain, right 05/08/2020  . Preventative health care 07/07/2019  . Advance directive discussed with patient 10/20/2018  . Fatty liver disease, nonalcoholic 26/83/4196  . Breast cancer of lower-outer quadrant of left female breast ([place]) 08/24/2013  . PUD (peptic ulcer disease) 09/06/2010  . HEPATOMEGALY, HX OF 12/21/2009  . LOW BACK PAIN 08/16/2008  . Dyslipidemia 04/20/2007  . Essential hypertension 04/20/2007  . GERD 04/20/2007  . DIVERTICULOSIS, COLON 04/20/2007  . MENOPAUSAL SYNDROME 04/20/2007   Past Medical History:  Diagnosis Date  . Arthritis    back  . Breast cancer ([place])    Left Breast - ductal  Carcinoma In-Situ - diagnosed on MRI- biopsy  . DIVERTICULOSIS, COLON 04/20/2007  . GERD 04/20/2007  . H/O hiatal hernia   . Heart murmur    told by PCP- years ago-never had echo  . HEPATOMEGALY, HX OF 12/21/2009  . HYPERLIPIDEMIA 04/20/2007  . HYPERTENSION 04/20/2007  . LOW BACK PAIN 08/16/2008  . MENOPAUSAL SYNDROME 04/20/2007  . PEPTIC ULCER DISEASE 09/06/2010  . Personal history of chemotherapy    2015  . Personal history of radiation therapy    2015  . Wears glasses    Past Surgical History:  Procedure Laterality Date  . ABDOMINAL HYSTERECTOMY    . BREAST LUMPECTOMY Left    10/07/13 & 10/25/13  . BREAST LUMPECTOMY WITH NEEDLE LOCALIZATION Left 10/07/2013   Procedure: BREAST LUMPECTOMY WITH NEEDLE LOCALIZATION EXCISION OF BILATERAL AXILLARY SKIN TAGS ;  Surgeon: Edward Jolly, MD;  Location: [facility];  Service: General;  Laterality: Left;  . BREAST SURGERY     30 rounds radiation  . CARPAL TUNNEL RELEASE Bilateral   . CHOLECYSTECTOMY    . COLONOSCOPY    . RE-EXCISION OF BREAST LUMPECTOMY Left 10/25/2013   Procedure: RE-EXCISION OF BREAST LUMPECTOMY;  Surgeon: Edward Jolly, MD;  Location: [facility];  Service: General;  Laterality: Left;  clean wound class  . TUBAL LIGATION  Social History   Tobacco Use  . Smoking status: Former Smoker    Quit date: 10/21/1978    Years since quitting: 41.5  . Smokeless tobacco: Never Used  Substance Use Topics  . Alcohol use: No  . Drug use: No   Family History  Problem Relation Age of Onset  . Hypertension Mother   . Hyperlipidemia Mother   . Heart disease Mother        congestive heart failure  . Congestive Heart Failure Mother   . Cancer Father        hx of melanoma  . Hypertension Sister   . Skin cancer Sister 61       basal cell carcinoma  . Hypertension Brother   . Heart disease Maternal Grandmother 80  . Breast cancer Maternal Aunt 60  . Colon cancer Paternal Grandmother 75  . Heart disease  Maternal Aunt 42  . Prostate cancer Cousin    Allergies  Allergen Reactions  . Penicillins Anaphylaxis  . Erythromycin Nausea And Vomiting  . Adhesive [Tape]     rash  . Amoxicillin     REACTION: unspecified  . Shellfish Allergy     welts  . Sulfamethoxazole Hives    REACTION: unspecified   Current Outpatient Medications on File Prior to Visit  Medication Sig Dispense Refill  . atorvastatin (LIPITOR) 40 MG tablet TAKE 1 TABLET DAILY 90 tablet 3  . Calcium Carbonate-Vitamin D (CALCIUM + D PO) Take 1 tablet by mouth 2 (two) times daily before a meal.     . cetirizine (ZYRTEC) 10 MG tablet Take 10 mg by mouth daily as needed for allergies.    Marland Kitchen esomeprazole (NEXIUM) 40 MG capsule Take 1 capsule (40 mg total) by mouth daily. 90 capsule 3  . hydrochlorothiazide (HYDRODIURIL) 25 MG tablet Take 1 tablet (25 mg total) by mouth daily. 90 tablet 3  . letrozole (FEMARA) 2.5 MG tablet Take 1 tablet (2.5 mg total) by mouth daily. 90 tablet 3  . Multiple Vitamins-Minerals (OCUVITE ADULT 50+ PO) Take by mouth.    . Multiple Vitamins-Minerals (WOMENS MULTIVITAMIN PLUS PO) Take 1 tablet by mouth daily.     . traMADol (ULTRAM) 50 MG tablet Take 1 tablet by mouth daily as needed.     No current facility-administered medications on file prior to visit.      Review of Systems  Constitutional: Negative for activity change, appetite change, fatigue, fever and unexpected weight change.  HENT: Negative for congestion, ear pain, rhinorrhea, sinus pressure and sore throat.   Eyes: Negative for pain, redness and visual disturbance.  Respiratory: Negative for cough, shortness of breath and wheezing.   Cardiovascular: Negative for chest pain and palpitations.  Gastrointestinal: Negative for abdominal pain, blood in stool, constipation and diarrhea.  Endocrine: Negative for polydipsia and polyuria.  Genitourinary: Negative for dysuria, frequency and urgency.  Musculoskeletal: Positive for arthralgias.  Negative for back pain and myalgias.  Skin: Positive for color change. Negative for pallor and rash.  Allergic/Immunologic: Negative for environmental allergies.  Neurological: Negative for dizziness, syncope and headaches.  Hematological: Negative for adenopathy. Does not bruise/bleed easily.  Psychiatric/Behavioral: Negative for decreased concentration and dysphoric mood. The patient is not nervous/anxious.        Objective:   Physical Exam Constitutional:      General: She is not in acute distress.    Appearance: She is obese. She is not ill-appearing.  Eyes:     General: No scleral icterus.  Conjunctiva/sclera: Conjunctivae normal.     Pupils: Pupils are equal, round, and reactive to light.  Cardiovascular:     Rate and Rhythm: Normal rate and regular rhythm.     Pulses: Normal pulses.  Pulmonary:     Effort: Pulmonary effort is normal. No respiratory distress.     Breath sounds: Normal breath sounds. No wheezing.  Musculoskeletal:     Cervical back: Neck supple.     Comments: L first MTP joint is swollen/erythematous and warm  Nl rom foot /toes and ankle Moderately tender  Uncomfortable to walk on  No crepitus in joint    Lymphadenopathy:     Cervical: No cervical adenopathy.  Skin:    General: Skin is warm and dry.     Findings: Erythema present.  Neurological:     Mental Status: She is alert.     Sensory: No sensory deficit.     Coordination: Coordination normal.  Psychiatric:        Mood and Affect: Mood normal.           Assessment & Plan:   Problem List Items Addressed This Visit      Other   Great toe pain, right    With erythema and swelling in pt with high pain tolerance Exam is consistent with gout , and no specific trauma history  She has used naproxen prn (caution with h/o GI issues) Does not feel like she needs addnl tx (suggested trial of diclofenac gel)  Takes hctz and may not be well hydrated Will double water intake Lab today incl  bmet and cbc and uric acid Xray of foot to look for erosive joint change  Handouts given re: gout and low purine diet       Relevant Orders   DG Foot Complete Right   Basic metabolic panel   CBC with Differential/Platelet   Uric acid

## 2020-05-08 NOTE — Telephone Encounter (Signed)
[facility] [place] Night - Client Nonclinical Telephone Record  [facility] Client [place] Night - Client Client Site [place] Physician Viviana Simpler- MD Contact Type Call Who Is Calling Patient / Member / Family / Caregiver Caller Name [place] Phone Number [phone] Patient Name Sallyann Kinnaird Patient DOB [birth year] Call Type Message Only Information Provided Reason for Call Request to Schedule Office Appointment Initial Comment PT woke up with a swollen toe over the weekend. Declined triage. Disp. Time Disposition Final User 05/08/2020 8:09:50 AM General Information Provided Yes Silvano Rusk Call Closed By: Silvano Rusk Transaction Date/Time: 05/08/2020 8:06:38 AM (ET)

## 2020-05-09 ENCOUNTER — Other Ambulatory Visit: Payer: Self-pay | Admitting: Hematology and Oncology

## 2020-05-09 DIAGNOSIS — C50512 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: Secondary | ICD-10-CM

## 2020-05-09 LAB — CBC WITH DIFFERENTIAL/PLATELET
Basophils Absolute: 0.1 10*3/uL (ref 0.0–0.1)
Basophils Relative: 0.6 % (ref 0.0–3.0)
Eosinophils Absolute: 0.2 10*3/uL (ref 0.0–0.7)
Eosinophils Relative: 2.7 % (ref 0.0–5.0)
HCT: 42.2 % (ref 36.0–46.0)
Hemoglobin: 14.3 g/dL (ref 12.0–15.0)
Lymphocytes Relative: 28.5 % (ref 12.0–46.0)
Lymphs Abs: 2.3 10*3/uL (ref 0.7–4.0)
MCHC: 33.8 g/dL (ref 30.0–36.0)
MCV: 86 fl (ref 78.0–100.0)
Monocytes Absolute: 0.5 10*3/uL (ref 0.1–1.0)
Monocytes Relative: 6.4 % (ref 3.0–12.0)
Neutro Abs: 5.1 10*3/uL (ref 1.4–7.7)
Neutrophils Relative %: 61.8 % (ref 43.0–77.0)
Platelets: 224 10*3/uL (ref 150.0–400.0)
RBC: 4.9 Mil/uL (ref 3.87–5.11)
RDW: 13.1 % (ref 11.5–15.5)
WBC: 8.2 10*3/uL (ref 4.0–10.5)

## 2020-05-09 LAB — BASIC METABOLIC PANEL
BUN: 13 mg/dL (ref 6–23)
CO2: 33 mEq/L — ABNORMAL HIGH (ref 19–32)
Calcium: 9.9 mg/dL (ref 8.4–10.5)
Chloride: 100 mEq/L (ref 96–112)
Creatinine, Ser: 0.89 mg/dL (ref 0.40–1.20)
GFR: 63.22 mL/min (ref >60.00)
Glucose, Bld: 101 mg/dL — ABNORMAL HIGH (ref 70–99)
Potassium: 4.6 mEq/L (ref 3.5–5.1)
Sodium: 140 mEq/L (ref 135–145)

## 2020-05-09 LAB — URIC ACID: Uric Acid, Serum: 7.8 mg/dL — ABNORMAL HIGH (ref 2.4–7.0)

## 2020-05-10 ENCOUNTER — Encounter: Payer: Self-pay | Admitting: *Deleted

## 2020-05-25 NOTE — Progress Notes (Signed)
Patient Care Team: Venia Carbon, MD as PCP - General (Internal Medicine)  DIAGNOSIS:    ICD-10-CM   1. 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, unspecified estrogen receptor status ([place])  C50.512 MR BREAST BILATERAL W WO CONTRAST INC CAD    SUMMARY OF ONCOLOGIC HISTORY: Oncology History  Breast cancer of lower-outer quadrant of left female breast ([place])  10/07/2013 Surgery   Left breast lumpectomy: DCIS ER positive PR positive, reexcision of positive margins on 10/25/2013, final margins negative   11/08/2013 - 12/27/2013 Radiation Therapy   Adjuvant radiation therapy   01/03/2014 Procedure   Genetic testing negative for breast and ovarian cancer panels   01/24/2014 - 10/16/2014 Anti-estrogen oral therapy   Tamoxifen 20 mg daily 5 years   09/13/2014 Relapse/Recurrence   MRI of the breast revealed a 3.5 cm area of non-mass enhancement left breast lateral 3.5 x 1.9 x 1.6 cm, oval circumscribed T2 bright enhancing mass right breast upper outer quadrant 1 x 0.7 cm, linear non-mass enhancement 11 x 6 x 3 mm   10/07/2014 Surgery   Left breast lumpectomy: DCIS grade 10.6 cm ER 100%, PR 0%, multiple skin tags benign fibroepithelial polyps, reexcision 10/25/2013 benign fibrocystic changes with calcifications   12/27/2014 -  Anti-estrogen oral therapy   Anastrozole 1 mg daily changed to letrozole 2.5 mg daily 01/05/2017due to musculoskeletal aches and pains     CHIEF COMPLIANT: Follow-up of recurrent left breast cancer on letrozole  INTERVAL HISTORY: Katie Becker is a 69 y.o. with above-mentioned history of recurrent left breast cancer treated with lumpectomy, radiation, and who is currently on antiestrogen therapy with letrozole. Mammogram on 10/07/19 showed no evidence of malignancy bilaterally. She presents to the clinic today for annual follow-up.   ALLERGIES:  is allergic to penicillins, erythromycin, adhesive [tape], amoxicillin, shellfish allergy, and  sulfamethoxazole.  MEDICATIONS:  Current Outpatient Medications  Medication Sig Dispense Refill  . atorvastatin (LIPITOR) 40 MG tablet TAKE 1 TABLET DAILY 90 tablet 3  . Calcium Carbonate-Vitamin D (CALCIUM + D PO) Take 1 tablet by mouth 2 (two) times daily before a meal.     . cetirizine (ZYRTEC) 10 MG tablet Take 10 mg by mouth daily as needed for allergies.    Marland Kitchen esomeprazole (NEXIUM) 40 MG capsule Take 1 capsule (40 mg total) by mouth daily. 90 capsule 3  . hydrochlorothiazide (HYDRODIURIL) 25 MG tablet Take 1 tablet (25 mg total) by mouth daily. 90 tablet 3  . Multiple Vitamins-Minerals (OCUVITE ADULT 50+ PO) Take by mouth.    . Multiple Vitamins-Minerals (WOMENS MULTIVITAMIN PLUS PO) Take 1 tablet by mouth daily.     . traMADol (ULTRAM) 50 MG tablet Take 1 tablet by mouth daily as needed.     No current facility-administered medications for this visit.    PHYSICAL EXAMINATION: ECOG PERFORMANCE STATUS: 1 - Symptomatic but completely ambulatory  Vitals:   05/26/20 0956  BP: 134/70  Pulse: 74  Resp: 18  Temp: 98.7 F (37.1 C)  SpO2: 97%   Filed Weights   05/26/20 0956  Weight: 176 lb 6.4 oz (80 kg)    BREAST: No palpable masses or nodules in either right or left breasts. No palpable axillary supraclavicular or infraclavicular adenopathy no breast tenderness or nipple discharge. (exam performed in the presence of a chaperone)  LABORATORY DATA:  I have reviewed the data as listed CMP Latest Ref Rng & Units 05/08/2020 07/07/2019 06/15/2019  Glucose 70 - 99 mg/dL 101(H) 115(H) -  BUN 6 - 23 mg/dL 13 15 -  Creatinine 0.40 - 1.20 mg/dL 0.89 0.88 0.80  Sodium 135 - 145 mEq/L 140 141 -  Potassium 3.5 - 5.1 mEq/L 4.6 4.1 -  Chloride 96 - 112 mEq/L 100 100 -  CO2 19 - 32 mEq/L 33(H) 31 -  Calcium 8.4 - 10.5 mg/dL 9.9 10.2 -  Total Protein 6.0 - 8.3 g/dL - 7.4 -  Total Bilirubin 0.2 - 1.2 mg/dL - 0.7 -  Alkaline Phos 39 - 117 U/L - 100 -  AST 0 - 37 U/L - 75(H) -  ALT 0 - 35  U/L - 65(H) -    Lab Results  Component Value Date   WBC 8.2 05/08/2020   HGB 14.3 05/08/2020   HCT 42.2 05/08/2020   MCV 86.0 05/08/2020   PLT 224.0 05/08/2020   NEUTROABS 5.1 05/08/2020    ASSESSMENT & PLAN:  Breast cancer of lower-outer quadrant of left female breast 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 Breast exam 05/26/2020:No concerns on exam 2. Mammograms and ultrasound12/17/2020, benign, breast density category C 3. Bone density December 2019 T score -0.8: Normal bone density. 4.Breast MRI 06/15/2019: Benign findings 1 year follow-up MRI of the left breast recommended I ordered a new breast MRI to be done at the end of August.  Return to clinic in 1 year for follow-up    Orders Placed This Encounter  Procedures  . MR BREAST BILATERAL W WO CONTRAST INC CAD    Standing Status:   Future    Standing Expiration Date:   05/26/2021    Order Specific Question:   If indicated for the ordered procedure, I authorize the administration of contrast media per Radiology protocol    Answer:   Yes    Order Specific Question:   What is the patient's sedation requirement?    Answer:   No Sedation    Order Specific Question:   Does the patient have a pacemaker or implanted devices?    Answer:   No    Order Specific Question:   Radiology Contrast Protocol - do NOT remove file path    Answer:   [email or web address]    Order Specific Question:   Preferred imaging location?    Answer:   [facility] (table limit-550lbs)   The patient has a good understanding  of the overall plan. she agrees with it. she will call with any problems that may develop before the next visit here.  Total time spent: 20 mins including face to face time and time spent for planning, charting and coordination of care  Nicholas Lose, MD 05/26/2020  I, Cloyde Reams Dorshimer, am acting as scribe for Dr. Nicholas Lose.  I have reviewed the above documentation for accuracy and completeness, and I agree with the above.

## 2020-05-26 ENCOUNTER — Inpatient Hospital Stay: Payer: Medicare HMO | Attending: Hematology and Oncology | Admitting: Hematology and Oncology

## 2020-05-26 ENCOUNTER — Telehealth: Payer: Self-pay | Admitting: Hematology and Oncology

## 2020-05-26 ENCOUNTER — Other Ambulatory Visit: Payer: Self-pay

## 2020-05-26 DIAGNOSIS — Z923 Personal history of irradiation: Secondary | ICD-10-CM | POA: Insufficient documentation

## 2020-05-26 DIAGNOSIS — R110 Nausea: Secondary | ICD-10-CM | POA: Insufficient documentation

## 2020-05-26 DIAGNOSIS — Z79899 Other long term (current) drug therapy: Secondary | ICD-10-CM | POA: Insufficient documentation

## 2020-05-26 DIAGNOSIS — Z170 Estrogen receptor positive status [ER+]: Secondary | ICD-10-CM | POA: Insufficient documentation

## 2020-05-26 DIAGNOSIS — C50512 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: Secondary | ICD-10-CM | POA: Diagnosis not present

## 2020-05-26 DIAGNOSIS — Z79811 Long term (current) use of aromatase inhibitors: Secondary | ICD-10-CM | POA: Diagnosis not present

## 2020-05-26 NOTE — Assessment & Plan Note (Signed)
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 Breast exam 05/26/2020:No concerns on exam 2. Mammograms and ultrasound12/17/2020, benign, breast density category C 3. Bone density December 2019 T score -0.8: Normal bone density. 4.Breast MRI 06/15/2019: Benign findings 1 year follow-up MRI of the left breast recommended  Return to clinic in 1 year for follow-up

## 2020-05-26 NOTE — Telephone Encounter (Signed)
Scheduled appts per 8/6 los. Gave pt pa print out of AVS.

## 2020-06-08 DIAGNOSIS — Z20822 Contact with and (suspected) exposure to covid-19: Secondary | ICD-10-CM | POA: Diagnosis not present

## 2020-06-08 DIAGNOSIS — U071 COVID-19: Secondary | ICD-10-CM | POA: Diagnosis not present

## 2020-06-15 DIAGNOSIS — Z20822 Contact with and (suspected) exposure to covid-19: Secondary | ICD-10-CM | POA: Diagnosis not present

## 2020-06-15 DIAGNOSIS — Z03818 Encounter for observation for suspected exposure to other biological agents ruled out: Secondary | ICD-10-CM | POA: Diagnosis not present

## 2020-06-19 ENCOUNTER — Other Ambulatory Visit: Payer: Medicare HMO

## 2020-06-28 DIAGNOSIS — R69 Illness, unspecified: Secondary | ICD-10-CM | POA: Diagnosis not present

## 2020-07-04 DIAGNOSIS — Z20822 Contact with and (suspected) exposure to covid-19: Secondary | ICD-10-CM | POA: Diagnosis not present

## 2020-07-08 ENCOUNTER — Other Ambulatory Visit: Payer: Self-pay

## 2020-07-08 ENCOUNTER — Ambulatory Visit
Admission: RE | Admit: 2020-07-08 | Discharge: 2020-07-08 | Disposition: A | Discharge status: Home / Self Care | Payer: Medicare HMO | Source: Ambulatory Visit | Attending: Hematology and Oncology | Admitting: Hematology and Oncology

## 2020-07-08 DIAGNOSIS — C50512 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: Secondary | ICD-10-CM

## 2020-07-08 DIAGNOSIS — N6312 Unspecified lump in the right breast, upper inner quadrant: Secondary | ICD-10-CM | POA: Diagnosis not present

## 2020-07-08 MED ORDER — GADOBUTROL 1 MMOL/ML IV SOLN
8.0000 mL | Freq: Once | INTRAVENOUS | Status: AC | PRN
  Administered 2020-07-08: 8 mL via INTRAVENOUS

## 2020-07-11 ENCOUNTER — Telehealth: Payer: Self-pay | Admitting: Hematology and Oncology

## 2020-07-11 NOTE — Telephone Encounter (Signed)
I informed the patient of the breast MRI was stable. She will need to continue with annual mammograms and annual breast MRIs.

## 2020-07-12 ENCOUNTER — Ambulatory Visit (INDEPENDENT_AMBULATORY_CARE_PROVIDER_SITE_OTHER): Payer: Medicare HMO | Admitting: Internal Medicine

## 2020-07-12 ENCOUNTER — Encounter: Payer: Self-pay | Admitting: Internal Medicine

## 2020-07-12 ENCOUNTER — Other Ambulatory Visit: Payer: Self-pay

## 2020-07-12 VITALS — BP 110/80 | HR 73 | Temp 97.7°F | Ht 62.0 in | Wt 174.0 lb

## 2020-07-12 DIAGNOSIS — I10 Essential (primary) hypertension: Secondary | ICD-10-CM | POA: Diagnosis not present

## 2020-07-12 DIAGNOSIS — Z853 Personal history of malignant neoplasm of breast: Secondary | ICD-10-CM

## 2020-07-12 DIAGNOSIS — G8929 Other chronic pain: Secondary | ICD-10-CM

## 2020-07-12 DIAGNOSIS — K219 Gastro-esophageal reflux disease without esophagitis: Secondary | ICD-10-CM

## 2020-07-12 DIAGNOSIS — M545 Low back pain, unspecified: Secondary | ICD-10-CM

## 2020-07-12 DIAGNOSIS — Z7189 Other specified counseling: Secondary | ICD-10-CM

## 2020-07-12 DIAGNOSIS — Z23 Encounter for immunization: Secondary | ICD-10-CM

## 2020-07-12 DIAGNOSIS — Z0000 Encounter for general adult medical examination without abnormal findings: Secondary | ICD-10-CM

## 2020-07-12 NOTE — Assessment & Plan Note (Signed)
See social history 

## 2020-07-12 NOTE — Assessment & Plan Note (Signed)
Persistent symptoms  Needs the daily nexium 40mg 

## 2020-07-12 NOTE — Progress Notes (Signed)
Hearing Screening   125Hz 250Hz 500Hz 1000Hz 2000Hz 3000Hz 4000Hz 6000Hz 8000Hz  Right ear:   20 20 20  20    Left ear:   20 20 20  20    Vision Screening Comments: December 2020   

## 2020-07-12 NOTE — Progress Notes (Signed)
Subjective:    Patient ID: Katie Becker, female    DOB: [birth year], 69 y.o.   MRN: [id number]  HPI Here for Medicare wellness visit and follow up of chronic health conditions This visit occurred during the SARS-CoV-2 public health emergency.  Safety protocols were in place, including screening questions prior to the visit, additional usage of staff PPE, and extensive cleaning of exam room while observing appropriate contact time as indicated for disinfecting solutions.   Reviewed form and advanced directives Reviewed other doctors No alcohol or tobacco Walks some---and keeps up her house (walks dog, etc) Vision and hearing are okay [place] once after jumping on couch--no real injury (just back bothering her) Not depressed or anhedonic Independent with instrumental ADLs No sig memory issues  Still has some mild pain in right great toe Drinks lots of water  Had been on HCTZ for 13 years---was labile after husband died BP generally fine since then No chest pain or SOB Some cough---thinks it is allergy and not necessarily after the COVID last month No edema No palpitations  Gets burning in her stomach---once in a while even on the nexium Doesn't tolerate skipping doses Past ulcer disease in her 20's No dysphagia  Ongoing arthritis ---worst in upper spine, hands, etc Uses tramadol rarely Uses ibuprofen for the toe (rarely)----exercises for her joints  Just finished medication for breast cancer  Current Outpatient Medications on File Prior to Visit  Medication Sig Dispense Refill  . atorvastatin (LIPITOR) 40 MG tablet TAKE 1 TABLET DAILY 90 tablet 3  . Calcium Carbonate-Vitamin D (CALCIUM + D PO) Take 1 tablet by mouth 2 (two) times daily before a meal.     . cetirizine (ZYRTEC) 10 MG tablet Take 10 mg by mouth daily as needed for allergies.    Marland Kitchen esomeprazole (NEXIUM) 40 MG capsule Take 1 capsule (40 mg total) by mouth daily. 90 capsule 3  . hydrochlorothiazide (HYDRODIURIL) 25  MG tablet Take 1 tablet (25 mg total) by mouth daily. 90 tablet 3  . Multiple Vitamins-Minerals (OCUVITE ADULT 50+ PO) Take by mouth.    . Multiple Vitamins-Minerals (WOMENS MULTIVITAMIN PLUS PO) Take 1 tablet by mouth daily.     . traMADol (ULTRAM) 50 MG tablet Take 1 tablet by mouth daily as needed.     No current facility-administered medications on file prior to visit.    Allergies  Allergen Reactions  . Penicillins Anaphylaxis  . Erythromycin Nausea And Vomiting  . Adhesive [Tape]     rash  . Amoxicillin     REACTION: unspecified  . Shellfish Allergy     welts  . Sulfamethoxazole Hives    REACTION: unspecified    Past Medical History:  Diagnosis Date  . Arthritis    back  . Breast cancer ([place])    Left Breast - ductal Carcinoma In-Situ - diagnosed on MRI- biopsy  . DIVERTICULOSIS, COLON 04/20/2007  . GERD 04/20/2007  . H/O hiatal hernia   . Heart murmur    told by PCP- years ago-never had echo  . HEPATOMEGALY, HX OF 12/21/2009  . HYPERLIPIDEMIA 04/20/2007  . HYPERTENSION 04/20/2007  . LOW BACK PAIN 08/16/2008  . MENOPAUSAL SYNDROME 04/20/2007  . PEPTIC ULCER DISEASE 09/06/2010  . Personal history of chemotherapy    2015  . Personal history of radiation therapy    2015  . Wears glasses     Past Surgical History:  Procedure Laterality Date  . ABDOMINAL HYSTERECTOMY    . BREAST LUMPECTOMY Left  10/07/13 & 10/25/13  . BREAST LUMPECTOMY WITH NEEDLE LOCALIZATION Left 10/07/2013   Procedure: BREAST LUMPECTOMY WITH NEEDLE LOCALIZATION EXCISION OF BILATERAL AXILLARY SKIN TAGS ;  Surgeon: Edward Jolly, MD;  Location: [facility];  Service: General;  Laterality: Left;  . BREAST SURGERY     30 rounds radiation  . CARPAL TUNNEL RELEASE Bilateral   . CHOLECYSTECTOMY    . COLONOSCOPY    . RE-EXCISION OF BREAST LUMPECTOMY Left 10/25/2013   Procedure: RE-EXCISION OF BREAST LUMPECTOMY;  Surgeon: Edward Jolly, MD;  Location: [facility];  Service: General;   Laterality: Left;  clean wound class  . TUBAL LIGATION      Family History  Problem Relation Age of Onset  . Hypertension Mother   . Hyperlipidemia Mother   . Heart disease Mother        congestive heart failure  . Congestive Heart Failure Mother   . Cancer Father        hx of melanoma  . Hypertension Sister   . Skin cancer Sister 75       basal cell carcinoma  . Hypertension Brother   . Heart disease Maternal Grandmother 65  . Breast cancer Maternal Aunt 60  . Colon cancer Paternal Grandmother 52  . Heart disease Maternal Aunt 42  . Prostate cancer Cousin     Social History   Socioeconomic History  . Marital status: Widowed    Spouse name: Not on file  . Number of children: 2  . Years of education: Not on file  . Highest education level: Not on file  Occupational History    Employer: [employer]    Comment: Retired  Tobacco Use  . Smoking status: Former Smoker    Quit date: 10/21/1978    Years since quitting: 41.7  . Smokeless tobacco: Never Used  Substance and Sexual Activity  . Alcohol use: No  . Drug use: No  . Sexual activity: Not Currently  Other Topics Concern  . 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 Difficulty of Paying Living Expenses: Not on file  Food Insecurity:   . Worried About Charity fundraiser in the Last Year: Not on file  . Ran Out of Food in the Last Year: Not on file  Transportation Needs:   . Lack of Transportation (Medical): Not on file  . Lack of Transportation (Non-Medical): Not on file  Physical Activity:   . Days of Exercise per Week: Not on file  . Minutes of Exercise per Session: Not on file  Stress:   . Feeling of Stress : Not on file  Social  Connections:   . Frequency of Communication with Friends and Family: Not on file  . Frequency of Social Gatherings with Friends and Family: Not on file  . Attends Religious Services: Not on file  . Active Member of Clubs or Organizations: Not on file  . Attends Archivist Meetings: Not on file  . Marital Status: Not on file  Intimate Partner Violence:   . Fear of Current or Ex-Partner: Not on file  . Emotionally Abused: Not on file  . Physically Abused: Not  on file  . Sexually Abused: Not on file   Review of Systems Appetite is good Weight down slightly Sleeps okay Wears seat belt Teeth okay---keeps up with dentist Bowels are fine--no blood No dysuria or hematuria. No incontinence No skin issues---other than chigger bites Keeps up with gyn     Objective:   Physical Exam Constitutional:      Appearance: Normal appearance.  HENT:     Mouth/Throat:     Comments: No oral lesions Eyes:     Conjunctiva/sclera: Conjunctivae normal.     Pupils: Pupils are equal, round, and reactive to light.  Cardiovascular:     Rate and Rhythm: Normal rate and regular rhythm.     Pulses: Normal pulses.     Heart sounds: No murmur heard.  No gallop.   Pulmonary:     Effort: Pulmonary effort is normal.     Breath sounds: Normal breath sounds. No wheezing or rales.  Abdominal:     Palpations: Abdomen is soft.     Tenderness: There is no abdominal tenderness.  Musculoskeletal:     Cervical back: Neck supple.     Right lower leg: No edema.     Left lower leg: No edema.  Lymphadenopathy:     Cervical: No cervical adenopathy.  Skin:    General: Skin is warm.     Findings: No rash.  Neurological:     Mental Status: She is alert and oriented to person, place, and time.     Comments: President---"Biden, Trump, Obama" 100-93-? [id number]-l-r-[id number] Recall 3/3  Psychiatric:        Mood and Affect: Mood normal.        Behavior: Behavior normal.            Assessment & Plan:

## 2020-07-12 NOTE — Assessment & Plan Note (Signed)
BP Readings from Last 3 Encounters:  07/12/20 110/80  05/26/20 134/70  05/08/20 (!) 148/88   BP fairly good Will try off the HCTZ due to the gout If high at home, will try losartan

## 2020-07-12 NOTE — Assessment & Plan Note (Signed)
Uses the tramadol rarely

## 2020-07-12 NOTE — Assessment & Plan Note (Signed)
I have personally reviewed the Medicare Annual Wellness questionnaire and have noted 1. The patient's medical and social history 2. Their use of alcohol, tobacco or illicit drugs 3. Their current medications and supplements 4. The patient's functional ability including ADL's, fall risks, home safety risks and hearing or visual             impairment. 5. Diet and physical activities 6. Evidence for depression or mood disorders  The patients weight, height, BMI and visual acuity have been recorded in the chart I have made referrals, counseling and provided education to the patient based review of the above and I have provided the pt with a written personalized care plan for preventive services.  I have provided you with a copy of your personalized plan for preventive services. Please take the time to review along with your updated medication list.  Colon due 2026 Keeps up with mammograms Discussed fitness Flu vaccine today

## 2020-07-12 NOTE — Assessment & Plan Note (Signed)
Now off meds Yearly evaluation continues

## 2020-07-12 NOTE — Patient Instructions (Signed)
Please stop the HCTZ. Check you blood pressure about weekly----at rest, sitting with your feet down. If your blood pressure is consistently over 140/90, let me know so I can start you on a new medication.

## 2020-08-14 DIAGNOSIS — H5213 Myopia, bilateral: Secondary | ICD-10-CM | POA: Diagnosis not present

## 2020-08-14 DIAGNOSIS — Z0100 Encounter for examination of eyes and vision without abnormal findings: Secondary | ICD-10-CM | POA: Diagnosis not present

## 2020-08-14 DIAGNOSIS — H25813 Combined forms of age-related cataract, bilateral: Secondary | ICD-10-CM | POA: Diagnosis not present

## 2020-08-14 DIAGNOSIS — H524 Presbyopia: Secondary | ICD-10-CM | POA: Diagnosis not present

## 2020-08-14 DIAGNOSIS — Z135 Encounter for screening for eye and ear disorders: Secondary | ICD-10-CM | POA: Diagnosis not present

## 2020-08-24 ENCOUNTER — Other Ambulatory Visit: Payer: Self-pay | Admitting: Hematology and Oncology

## 2020-08-24 DIAGNOSIS — Z1231 Encounter for screening mammogram for malignant neoplasm of breast: Secondary | ICD-10-CM

## 2020-09-10 ENCOUNTER — Other Ambulatory Visit: Payer: Self-pay | Admitting: Internal Medicine

## 2020-10-08 DIAGNOSIS — Z03818 Encounter for observation for suspected exposure to other biological agents ruled out: Secondary | ICD-10-CM | POA: Diagnosis not present

## 2020-10-08 DIAGNOSIS — Z20822 Contact with and (suspected) exposure to covid-19: Secondary | ICD-10-CM | POA: Diagnosis not present

## 2020-10-09 ENCOUNTER — Other Ambulatory Visit: Payer: Self-pay

## 2020-10-09 ENCOUNTER — Ambulatory Visit
Admission: RE | Admit: 2020-10-09 | Discharge: 2020-10-09 | Disposition: A | Discharge status: Home / Self Care | Payer: Medicare HMO | Source: Ambulatory Visit | Attending: Hematology and Oncology | Admitting: Hematology and Oncology

## 2020-10-09 DIAGNOSIS — Z1231 Encounter for screening mammogram for malignant neoplasm of breast: Secondary | ICD-10-CM

## 2020-10-11 ENCOUNTER — Other Ambulatory Visit: Payer: Self-pay

## 2020-10-11 ENCOUNTER — Encounter: Payer: Self-pay | Admitting: Internal Medicine

## 2020-10-11 ENCOUNTER — Ambulatory Visit (INDEPENDENT_AMBULATORY_CARE_PROVIDER_SITE_OTHER): Payer: Medicare HMO | Admitting: Internal Medicine

## 2020-10-11 DIAGNOSIS — I10 Essential (primary) hypertension: Secondary | ICD-10-CM

## 2020-10-11 NOTE — Assessment & Plan Note (Signed)
BP Readings from Last 3 Encounters:  10/11/20 136/72  07/12/20 110/80  05/26/20 134/70   BP still okay without the HCTZ Would have started low dose losartan--but will hold off Discussed lifestyle again

## 2020-10-11 NOTE — Progress Notes (Signed)
Subjective:    Patient ID: Katie Becker, female    DOB: [birth year], 69 y.o.   MRN: [id number]  HPI Here for follow up of HTN This visit occurred during the SARS-CoV-2 public health emergency.  Safety protocols were in place, including screening questions prior to the visit, additional usage of staff PPE, and extensive cleaning of exam room while observing appropriate contact time as indicated for disinfecting solutions.   "It has been hell over the past 2 months" Family issues Did not start the exercise  Has been checking BP periodically----124/67--143/70  Current Outpatient Medications on File Prior to Visit  Medication Sig Dispense Refill  . atorvastatin (LIPITOR) 40 MG tablet TAKE 1 TABLET DAILY 90 tablet 3  . Calcium Carbonate-Vitamin D (CALCIUM + D PO) Take 1 tablet by mouth 2 (two) times daily before a meal.     . cetirizine (ZYRTEC) 10 MG tablet Take 10 mg by mouth daily as needed for allergies.    Marland Kitchen esomeprazole (NEXIUM) 40 MG capsule Take 1 capsule (40 mg total) by mouth daily. 90 capsule 3  . Multiple Vitamins-Minerals (OCUVITE ADULT 50+ PO) Take by mouth.    . Multiple Vitamins-Minerals (WOMENS MULTIVITAMIN PLUS PO) Take 1 tablet by mouth daily.     . traMADol (ULTRAM) 50 MG tablet Take 1 tablet by mouth daily as needed.     No current facility-administered medications on file prior to visit.    Allergies  Allergen Reactions  . Penicillins Anaphylaxis  . Erythromycin Nausea And Vomiting  . Adhesive [Tape]     rash  . Amoxicillin     REACTION: unspecified  . Shellfish Allergy     welts  . Sulfamethoxazole Hives    REACTION: unspecified    Past Medical History:  Diagnosis Date  . Arthritis    back  . Breast cancer ([facility])    Left Breast - ductal Carcinoma In-Situ - diagnosed on MRI- biopsy  . DIVERTICULOSIS, COLON 04/20/2007  . GERD 04/20/2007  . H/O hiatal hernia   . Heart murmur    told by PCP- years ago-never had echo  . HEPATOMEGALY, HX OF 12/21/2009  .  HYPERLIPIDEMIA 04/20/2007  . HYPERTENSION 04/20/2007  . LOW BACK PAIN 08/16/2008  . MENOPAUSAL SYNDROME 04/20/2007  . PEPTIC ULCER DISEASE 09/06/2010  . Personal history of chemotherapy    2015  . Personal history of radiation therapy    2015  . Wears glasses     Past Surgical History:  Procedure Laterality Date  . ABDOMINAL HYSTERECTOMY    . BREAST LUMPECTOMY Left    10/07/13 & 10/25/13  . BREAST LUMPECTOMY WITH NEEDLE LOCALIZATION Left 10/07/2013   Procedure: BREAST LUMPECTOMY WITH NEEDLE LOCALIZATION EXCISION OF BILATERAL AXILLARY SKIN TAGS ;  Surgeon: Mariella Saa, MD;  Location: [facility];  Service: General;  Laterality: Left;  . BREAST SURGERY     30 rounds radiation  . CARPAL TUNNEL RELEASE Bilateral   . CHOLECYSTECTOMY    . COLONOSCOPY    . RE-EXCISION OF BREAST LUMPECTOMY Left 10/25/2013   Procedure: RE-EXCISION OF BREAST LUMPECTOMY;  Surgeon: Mariella Saa, MD;  Location: [facility];  Service: General;  Laterality: Left;  clean wound class  . TUBAL LIGATION      Family History  Problem Relation Age of Onset  . Hypertension Mother   . Hyperlipidemia Mother   . Heart disease Mother        congestive heart failure  . Congestive Heart Failure Mother   .  Cancer Father        hx of melanoma  . Hypertension Sister   . Skin cancer Sister 5       basal cell carcinoma  . Hypertension Brother   . Heart disease Maternal Grandmother 18  . Breast cancer Maternal Aunt 60  . Colon cancer Paternal Grandmother 34  . Heart disease Maternal Aunt 42  . Prostate cancer Cousin     Social History   Socioeconomic History  . Marital status: Widowed    Spouse name: Not on file  . Number of children: 2  . Years of education: Not on file  . Highest education level: Not on file  Occupational History    Employer: [employer]    Comment: Retired  Tobacco Use  . Smoking status: Former Smoker    Quit date: 10/21/1978    Years since  quitting: 42.0  . Smokeless tobacco: Never Used  Substance and Sexual Activity  . Alcohol use: No  . Drug use: No  . Sexual activity: Not Currently  Other Topics Concern  . 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   2 daughters      No living will   Daughters should be medical decision makers   Would accept resuscitation   Wouldn't want tube feeds if cognitively unaware   Social Determinants of Health   Financial Resource Strain: Not on file  Food Insecurity: Not on file  Transportation Needs: Not on file  Physical Activity: Not on file  Stress: Not on file  Social Connections: Not on file  Intimate Partner Violence: Not on file   Review of Systems New spot on left face--knot there for years, but now changed colors Not being careful with eating Weight up 5# Chronic pain--especially hands Tramadol for back from Dr Delilah Shan (ortho) No gout attacks--drinks lots of water    Objective:   Physical Exam Constitutional:      Appearance: Normal appearance.  Cardiovascular:     Rate and Rhythm: Normal rate and regular rhythm.     Heart sounds: No murmur heard. No gallop.   Pulmonary:     Effort: Pulmonary effort is normal.     Breath sounds: Normal breath sounds. No wheezing or rales.  Musculoskeletal:     Cervical back: Neck supple.  Lymphadenopathy:     Cervical: No cervical adenopathy.  Skin:    Comments: Benign 26mm hyperpigmented lesions  Neurological:     Mental Status: She is alert.            Assessment & Plan:

## 2020-10-18 ENCOUNTER — Other Ambulatory Visit: Payer: Self-pay | Admitting: Internal Medicine

## 2020-11-14 DIAGNOSIS — Z853 Personal history of malignant neoplasm of breast: Secondary | ICD-10-CM | POA: Diagnosis not present

## 2020-11-14 DIAGNOSIS — H02054 Trichiasis without entropian left upper eyelid: Secondary | ICD-10-CM | POA: Diagnosis not present

## 2020-11-14 DIAGNOSIS — H35011 Changes in retinal vascular appearance, right eye: Secondary | ICD-10-CM | POA: Diagnosis not present

## 2020-11-14 DIAGNOSIS — H3561 Retinal hemorrhage, right eye: Secondary | ICD-10-CM | POA: Diagnosis not present

## 2020-11-14 DIAGNOSIS — H33101 Unspecified retinoschisis, right eye: Secondary | ICD-10-CM | POA: Diagnosis not present

## 2020-11-14 DIAGNOSIS — H5315 Visual distortions of shape and size: Secondary | ICD-10-CM | POA: Diagnosis not present

## 2020-11-21 DIAGNOSIS — Z03818 Encounter for observation for suspected exposure to other biological agents ruled out: Secondary | ICD-10-CM | POA: Diagnosis not present

## 2020-11-21 DIAGNOSIS — Z20822 Contact with and (suspected) exposure to covid-19: Secondary | ICD-10-CM | POA: Diagnosis not present

## 2020-11-30 MED ORDER — ATORVASTATIN CALCIUM 40 MG PO TABS: 40.0000 mg | ORAL_TABLET | Freq: Every day | ORAL | 3 refills | Status: DC

## 2020-11-30 MED ORDER — ESOMEPRAZOLE MAGNESIUM 40 MG PO CPDR: 40.0000 mg | DELAYED_RELEASE_CAPSULE | Freq: Every day | ORAL | 3 refills | Status: DC

## 2021-01-08 DIAGNOSIS — M5136 Other intervertebral disc degeneration, lumbar region: Secondary | ICD-10-CM | POA: Diagnosis not present

## 2021-01-24 DIAGNOSIS — M545 Low back pain, unspecified: Secondary | ICD-10-CM | POA: Diagnosis not present

## 2021-01-26 DIAGNOSIS — M5416 Radiculopathy, lumbar region: Secondary | ICD-10-CM | POA: Diagnosis not present

## 2021-01-30 DIAGNOSIS — M545 Low back pain, unspecified: Secondary | ICD-10-CM | POA: Diagnosis not present

## 2021-02-01 DIAGNOSIS — M545 Low back pain, unspecified: Secondary | ICD-10-CM | POA: Diagnosis not present

## 2021-02-06 DIAGNOSIS — M545 Low back pain, unspecified: Secondary | ICD-10-CM | POA: Diagnosis not present

## 2021-02-09 DIAGNOSIS — M545 Low back pain, unspecified: Secondary | ICD-10-CM | POA: Diagnosis not present

## 2021-02-13 DIAGNOSIS — M545 Low back pain, unspecified: Secondary | ICD-10-CM | POA: Diagnosis not present

## 2021-03-01 DIAGNOSIS — Z03818 Encounter for observation for suspected exposure to other biological agents ruled out: Secondary | ICD-10-CM | POA: Diagnosis not present

## 2021-03-01 DIAGNOSIS — Z20822 Contact with and (suspected) exposure to covid-19: Secondary | ICD-10-CM | POA: Diagnosis not present

## 2021-03-06 DIAGNOSIS — M25552 Pain in left hip: Secondary | ICD-10-CM | POA: Diagnosis not present

## 2021-03-13 ENCOUNTER — Encounter (HOSPITAL_COMMUNITY): Payer: Self-pay | Admitting: Emergency Medicine

## 2021-03-13 ENCOUNTER — Emergency Department (HOSPITAL_COMMUNITY)
Admission: EM | Admit: 2021-03-13 | Discharge: 2021-03-14 | Disposition: A | Discharge status: Home / Self Care | Payer: Medicare HMO | Attending: Emergency Medicine | Admitting: Emergency Medicine

## 2021-03-13 ENCOUNTER — Emergency Department (HOSPITAL_COMMUNITY): Payer: Medicare HMO

## 2021-03-13 ENCOUNTER — Other Ambulatory Visit: Payer: Self-pay

## 2021-03-13 DIAGNOSIS — Z87891 Personal history of nicotine dependence: Secondary | ICD-10-CM | POA: Diagnosis not present

## 2021-03-13 DIAGNOSIS — M25519 Pain in unspecified shoulder: Secondary | ICD-10-CM | POA: Diagnosis not present

## 2021-03-13 DIAGNOSIS — M25512 Pain in left shoulder: Secondary | ICD-10-CM | POA: Diagnosis not present

## 2021-03-13 DIAGNOSIS — Z853 Personal history of malignant neoplasm of breast: Secondary | ICD-10-CM | POA: Diagnosis not present

## 2021-03-13 DIAGNOSIS — I10 Essential (primary) hypertension: Secondary | ICD-10-CM | POA: Diagnosis not present

## 2021-03-13 DIAGNOSIS — M79602 Pain in left arm: Secondary | ICD-10-CM | POA: Insufficient documentation

## 2021-03-13 DIAGNOSIS — Z9012 Acquired absence of left breast and nipple: Secondary | ICD-10-CM | POA: Insufficient documentation

## 2021-03-13 DIAGNOSIS — R0789 Other chest pain: Secondary | ICD-10-CM | POA: Insufficient documentation

## 2021-03-13 DIAGNOSIS — M542 Cervicalgia: Secondary | ICD-10-CM | POA: Insufficient documentation

## 2021-03-13 DIAGNOSIS — R079 Chest pain, unspecified: Secondary | ICD-10-CM | POA: Diagnosis not present

## 2021-03-13 DIAGNOSIS — Z743 Need for continuous supervision: Secondary | ICD-10-CM | POA: Diagnosis not present

## 2021-03-13 DIAGNOSIS — R21 Rash and other nonspecific skin eruption: Secondary | ICD-10-CM | POA: Diagnosis not present

## 2021-03-13 DIAGNOSIS — R072 Precordial pain: Secondary | ICD-10-CM | POA: Diagnosis not present

## 2021-03-13 LAB — CBC WITH DIFFERENTIAL/PLATELET
Abs Immature Granulocytes: 0.03 10*3/uL (ref 0.00–0.07)
Basophils Absolute: 0.1 10*3/uL (ref 0.0–0.1)
Basophils Relative: 1 %
Eosinophils Absolute: 0.2 10*3/uL (ref 0.0–0.5)
Eosinophils Relative: 1 %
HCT: 44.3 % (ref 36.0–46.0)
Hemoglobin: 14.3 g/dL (ref 12.0–15.0)
Immature Granulocytes: 0 %
Lymphocytes Relative: 19 %
Lymphs Abs: 2.1 10*3/uL (ref 0.7–4.0)
MCH: 28.3 pg (ref 26.0–34.0)
MCHC: 32.3 g/dL (ref 30.0–36.0)
MCV: 87.5 fL (ref 80.0–100.0)
Monocytes Absolute: 0.6 10*3/uL (ref 0.1–1.0)
Monocytes Relative: 6 %
Neutro Abs: 7.8 10*3/uL — ABNORMAL HIGH (ref 1.7–7.7)
Neutrophils Relative %: 73 %
Platelets: 234 10*3/uL (ref 150–400)
RBC: 5.06 MIL/uL (ref 3.87–5.11)
RDW: 12.4 % (ref 11.5–15.5)
WBC: 10.8 10*3/uL — ABNORMAL HIGH (ref 4.0–10.5)
nRBC: 0 % (ref 0.0–0.2)

## 2021-03-13 LAB — COMPREHENSIVE METABOLIC PANEL
ALT: 37 U/L (ref 0–44)
AST: 30 U/L (ref 15–41)
Albumin: 4.2 g/dL (ref 3.5–5.0)
Alkaline Phosphatase: 108 U/L (ref 38–126)
Anion gap: 9 (ref 5–15)
BUN: 13 mg/dL (ref 8–23)
CO2: 26 mmol/L (ref 22–32)
Calcium: 9.3 mg/dL (ref 8.9–10.3)
Chloride: 105 mmol/L (ref 98–111)
Creatinine, Ser: 0.96 mg/dL (ref 0.44–1.00)
GFR, Estimated: >60 mL/min (ref >60)
Glucose, Bld: 111 mg/dL — ABNORMAL HIGH (ref 70–99)
Potassium: 3.8 mmol/L (ref 3.5–5.1)
Sodium: 140 mmol/L (ref 135–145)
Total Bilirubin: 1.2 mg/dL (ref 0.3–1.2)
Total Protein: 6.9 g/dL (ref 6.5–8.1)

## 2021-03-13 LAB — TROPONIN I (HIGH SENSITIVITY)
Troponin I (High Sensitivity): 4 ng/L (ref <18)
Troponin I (High Sensitivity): 4 ng/L (ref <18)

## 2021-03-13 NOTE — ED Provider Notes (Signed)
Emergency Medicine Provider Triage Evaluation Note  Katie Becker , a 70 y.o. female  was evaluated in triage.  Pt complains of chest pain with last episode beginning at 5:30 PM this evening.  Left-sided chest radiating into the left neck and left shoulder: Resolved following aspirin and nitroglycerin from EMS.  Review of Systems  Positive: Chest pain Negative: Shortness of breath, vomiting, diaphoresis, dizziness  Physical Exam  BP (!) 161/92 (BP Location: Left Arm)   Pulse 86   Temp 98.4 F (36.9 C) (Oral)   Resp 16   SpO2 98%  Gen:   Awake, no distress   Resp:  Normal effort  MSK:   Moves extremities without difficulty  Other:    Medical Decision Making  Medically screening exam initiated at 8:15 PM.  Appropriate orders placed.  Dante Gang was informed that the remainder of the evaluation will be completed by another provider, this initial triage assessment does not replace that evaluation, and the importance of remaining in the ED until their evaluation is complete.     Lorayne Bender, PA-C 03/13/21 2017    Drenda Freeze, MD 03/14/21 [phone]

## 2021-03-13 NOTE — ED Provider Notes (Signed)
[facility] Provider Note   CSN: [id number] Arrival date & time: 03/13/21  1920     History Chief Complaint  Patient presents with  . Chest Pain    Katie [facility] is a 70 y.o. female.  70 y.o female with a PMH of High cholesterol, HTN presents to the ED with a chief complaint left shoulder pain, with radiation to her neck and across her chest.  Patient reports pain began this morning, she is unsure whether this pain woke her up from her sleep.  She does report she deals with chronic pain that is constant along her left shoulder, and her back due to prior issues with her thoracic spine and arthritis.  Does report today the pain felt different, described as a pain to the left shoulder with radiation to her neck and across her chest.  Does report taking some Tylenol without any improvement.  No alleviating or exacerbating factors.  Does report the pain originated at the left shoulder, radiating down her arm.  He does states she has "higher pain tolerance and others ", reports she usually deals with chronic pain that has been ongoing.  Does report pain to the left breast, however does have prior scar tissue due to chemotherapy.  Does report being previously on HCTZ for hypertension, however this was discontinued due to pressure being controlled with diet.  She did receive nitro along with aspirin by EMS which did help with her pain.  She is currently chest pain-free, does not have any pain to her left shoulder either.  Does report ongoing pain to her neck, she states "not sure because I been sitting out there for so long ".  She denies any shortness of breath, no prior cardiologist visit, no prior echo performed.  No prior history of blood clots. No other complaints.   The history is provided by the patient.  Chest Pain Pain location:  Substernal area Pain radiates to:  Neck Pain severity:  Mild Onset quality:  Gradual Duration:  1 day Associated symptoms:  back pain (chronic)   Associated symptoms: no abdominal pain, no fever, no headache, no nausea, no shortness of breath and no vomiting        Past Medical History:  Diagnosis Date  . Arthritis    back  . 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 DIVERTICULOSIS, COLON 04/20/2007  . GERD 04/20/2007  . H/O hiatal hernia   . Heart murmur    told by PCP- years ago-never had echo  . HEPATOMEGALY, HX OF 12/21/2009  . HYPERLIPIDEMIA 04/20/2007  . HYPERTENSION 04/20/2007  . LOW BACK PAIN 08/16/2008  . MENOPAUSAL SYNDROME 04/20/2007  . PEPTIC ULCER DISEASE 09/06/2010  . Personal history of chemotherapy    2015  . Personal history of radiation therapy    2015  . Wears glasses     Patient Active Problem List   Diagnosis Date Noted  . History of breast cancer 07/12/2020  . Great toe pain, right 05/08/2020  . Preventative health care 07/07/2019  . Advance directive discussed with patient 10/20/2018  . Fatty liver disease, nonalcoholic 03/55/9741  . Breast cancer of lower-outer quadrant of left female breast ([place]) 08/24/2013  . PUD (peptic ulcer disease) 09/06/2010  . HEPATOMEGALY, HX OF 12/21/2009  . LOW BACK PAIN 08/16/2008  . Dyslipidemia 04/20/2007  . Essential hypertension 04/20/2007  . GERD 04/20/2007  . DIVERTICULOSIS, COLON 04/20/2007  .  MENOPAUSAL SYNDROME 04/20/2007    Past Surgical History:  Procedure Laterality Date  . ABDOMINAL HYSTERECTOMY    . BREAST LUMPECTOMY Left    10/07/13 & 10/25/13  . BREAST LUMPECTOMY WITH NEEDLE LOCALIZATION Left 10/07/2013   Procedure: BREAST LUMPECTOMY WITH NEEDLE LOCALIZATION EXCISION OF BILATERAL AXILLARY SKIN TAGS ;  Surgeon: Edward Jolly, MD;  Location: [facility];  Service: General;  Laterality: Left;  . BREAST SURGERY     30 rounds radiation  . CARPAL TUNNEL RELEASE Bilateral   . CHOLECYSTECTOMY    . COLONOSCOPY    . RE-EXCISION OF BREAST LUMPECTOMY Left 10/25/2013   Procedure:  RE-EXCISION OF BREAST LUMPECTOMY;  Surgeon: Edward Jolly, MD;  Location: [facility];  Service: General;  Laterality: Left;  clean wound class  . TUBAL LIGATION       OB History   No obstetric history on file.     Family History  Problem Relation Age of Onset  . Hypertension Mother   . Hyperlipidemia Mother   . Heart disease Mother        congestive heart failure  . Congestive Heart Failure Mother   . Cancer Father        hx of melanoma  . Hypertension Sister   . Skin cancer Sister 56       basal cell carcinoma  . Hypertension Brother   . Heart disease Maternal Grandmother 58  . Breast cancer Maternal Aunt 60  . Colon cancer Paternal Grandmother 75  . Heart disease Maternal Aunt 42  . Prostate cancer Cousin     Social History   Tobacco Use  . Smoking status: Former Smoker    Quit date: 10/21/1978    Years since quitting: 42.4  . Smokeless tobacco: Never Used  Substance Use Topics  . Alcohol use: No  . Drug use: No    Home Medications Prior to Admission medications   Medication Sig Start Date End Date Taking? Authorizing Provider  atorvastatin (LIPITOR) 40 MG tablet Take 1 tablet (40 mg total) by mouth daily. 11/30/20   Venia Carbon, MD  Calcium Carbonate-Vitamin D (CALCIUM + D PO) Take 1 tablet by mouth 2 (two) times daily before a meal.     [provider]  cetirizine (ZYRTEC) 10 MG tablet Take 10 mg by mouth daily as needed for allergies.    [provider]  esomeprazole (NEXIUM) 40 MG capsule Take 1 capsule (40 mg total) by mouth daily. 11/30/20   Venia Carbon, MD  Multiple Vitamins-Minerals (OCUVITE ADULT 50+ PO) Take by mouth.    [provider]  Multiple Vitamins-Minerals (WOMENS MULTIVITAMIN PLUS PO) Take 1 tablet by mouth daily.     [provider]  traMADol (ULTRAM) 50 MG tablet Take 1 tablet by mouth daily as needed.    [provider]    Allergies    Penicillins, Erythromycin,  Adhesive [tape], Amoxicillin, Shellfish allergy, and Sulfamethoxazole  Review of Systems   Review of Systems  Constitutional: Negative for fever.  HENT: Negative for sore throat.   Respiratory: Negative for shortness of breath.   Cardiovascular: Positive for chest pain.  Gastrointestinal: Negative for abdominal pain, diarrhea, nausea and vomiting.  Genitourinary: Negative for flank pain.  Musculoskeletal: Positive for arthralgias, back pain (chronic) and neck pain.  Skin: Negative for pallor and wound.  Neurological: Negative for light-headedness and headaches.  All other systems reviewed and are negative.   Physical Exam Updated Vital Signs BP (!) 174/90  Pulse 84   Temp 98.4 F (36.9 C) (Oral)   Resp 17   Ht 5\' 2"  (1.575 m)   Wt 84 kg   SpO2 98%   BMI 33.87 kg/m   Physical Exam Vitals and nursing note reviewed.  Constitutional:      Appearance: She is well-developed.  HENT:     Head: Normocephalic and atraumatic.  Cardiovascular:     Rate and Rhythm: Normal rate.     Pulses:          Radial pulses are 2+ on the right side and 2+ on the left side.     Heart sounds: Normal heart sounds.  Pulmonary:     Effort: Pulmonary effort is normal.     Breath sounds: No wheezing.  Chest:     Chest wall: No tenderness.  Abdominal:     Palpations: Abdomen is soft. There is no mass.     Tenderness: Rebound:   Musculoskeletal:     Cervical back: Normal range of motion and neck supple.     Right lower leg: No tenderness. No edema.     Left lower leg: No tenderness. No edema.  Skin:    General: Skin is warm and dry.  Neurological:     Mental Status: She is alert and oriented to person, place, and time.     ED Results / Procedures / Treatments   Labs (all labs ordered are listed, but only abnormal results are displayed) Labs Reviewed  COMPREHENSIVE METABOLIC PANEL - Abnormal; Notable for the following components:      Result Value   Glucose, Bld 111 (*)    All other  components within normal limits  CBC WITH DIFFERENTIAL/PLATELET - Abnormal; Notable for the following components:   WBC 10.8 (*)    Neutro Abs 7.8 (*)    All other components within normal limits  TROPONIN I (HIGH SENSITIVITY)  TROPONIN I (HIGH SENSITIVITY)    EKG EKG Interpretation  Date/Time:  Tuesday Mar 13 2021 20:00:31 EDT Ventricular Rate:  77 PR Interval:  172 QRS Duration: 84 QT Interval:  398 QTC Calculation: 450 R Axis:   -23 Text Interpretation: Normal sinus rhythm Anterior infarct , age undetermined Abnormal ECG No prior ECG for comparison. No sTEMI Confirmed by Antony Blackbird [phone]) on 03/13/2021 11:39:43 PM   Radiology DG Chest 2 View  Result Date: 03/13/2021 CLINICAL DATA:  70 year old female with chest pain. EXAM: CHEST - 2 VIEW COMPARISON:  Chest radiograph dated 10/01/2013. FINDINGS: No focal consolidation, pleural effusion, or pneumothorax. The cardiac silhouette is within limits. Atherosclerotic calcification of the aorta. No acute osseous pathology. Degenerative changes of the spine. Right upper quadrant cholecystectomy clips. IMPRESSION: No active cardiopulmonary disease. Electronically Signed   By: Anner Crete M.D.   On: 03/13/2021 21:17    Procedures Procedures   Medications Ordered in ED Medications - No data to display  ED Course  I have reviewed the triage vital signs and the nursing notes.  Pertinent labs & imaging results that were available during my care of the patient were reviewed by me and considered in my medical decision making (see chart for details).    MDM Rules/Calculators/A&P  Patient with a PMH of high cholesterol presents to the ED with a chief complaint of left shoulder pain, radiation across her back into her neck.  Does report chronic back pain and neck pain.  She did take some Tylenol which did not improve with her pain.  Also pain was improved  by nitro along with aspirin that she was given by EMS.  She does have a prior  history of high blood pressure, however this was discontinued as she reports changing in her diet.  She was discontinued from HCTZ.  She does not have any prior history of CAD, no family history of CAD, no prior history of blood clots.  Her vitals on arrival were not remarkable for any tachypnea or tachycardia, denies any shortness of breath, denies any chest pain.  She does not have any headache, no shortness of breath, no other complaints.  Did voice a headache after receiving nitro.  Interpretation of her labs reveal a white blood cell count of slight leukocytosis of 10.8, hemoglobin is within normal limits.  CMP is without any electrolyte derangement, creatinine level is within normal limits.  LFTs are unremarkable.  Troponin x2 have been negative.  EKG noted with no prior comparisons, with no signs consistent with ischemia Lower suspicion for ACS,  HEART Score 3   Xray of the chest showed: No active cardiopulmonary disease.  Case was discussed with Dr. Sherry Ruffing who agrees with plan and management.  I discussed the results with patient, we did discuss obtaining a D-dimer level, however she reports she has been here for a long time.  She states that she feels that likely is pain acute on chronic, we will try treating with tramadol which she has at home along with Tylenol.  She will also follow-up with primary care physician in order to obtain better control of blood pressure at home.  She remains without any episodes of hypoxia or tachycardia.  Patient stable for discharge.  Return precautions discussed at length.  Portions of this note were generated with [employer]. Dictation errors may occur despite best attempts at proofreading.  Final Clinical Impression(s) / ED Diagnoses Final diagnoses:  Atypical chest pain    Rx / DC Orders ED Discharge Orders    None       Janeece Fitting, PA-C 03/13/21 2354    Tegeler, Gwenyth Allegra, MD 03/14/21 [phone]

## 2021-03-13 NOTE — ED Triage Notes (Signed)
Patient reports left chest pain radiating to left arm and left neck onset this morning , no SOB , denies emesis or diaphoresis , she received ASA 324 mg and 2 NTG sl by EMS prior to arrival with relief.

## 2021-03-13 NOTE — Discharge Instructions (Addendum)
Your laboratory results are within normal limits today.  The x-ray of your chest did not show any pneumonia, pleural effusion or acute finding.  We discussed following up with your primary care physician in order to obtain further management of your blood pressure control.

## 2021-03-16 ENCOUNTER — Other Ambulatory Visit: Payer: Self-pay

## 2021-03-16 ENCOUNTER — Encounter: Payer: Self-pay | Admitting: Internal Medicine

## 2021-03-16 ENCOUNTER — Ambulatory Visit (INDEPENDENT_AMBULATORY_CARE_PROVIDER_SITE_OTHER): Payer: Medicare HMO | Admitting: Internal Medicine

## 2021-03-16 DIAGNOSIS — R079 Chest pain, unspecified: Secondary | ICD-10-CM | POA: Insufficient documentation

## 2021-03-16 NOTE — Assessment & Plan Note (Signed)
Persists but better now Worsening with deep breath or leaning over suggests chest wall etiology--but no tenderness Nothing to suggest CAD,pulm embolus, pneumonia, etc  Suggested she try tylenol 1000mg  tid for 1-2 days to see if pain completely controlled (that would be reassuring)--then can use prn only If persists, will need to consider chest CT

## 2021-03-16 NOTE — Progress Notes (Addendum)
Subjective:    Patient ID: Katie Becker, female    DOB: [birth year], 70 y.o.   MRN: [id number]  HPI Here for ER follow up This visit occurred during the SARS-CoV-2 public health emergency.  Safety protocols were in place, including screening questions prior to the visit, additional usage of staff PPE, and extensive cleaning of exam room while observing appropriate contact time as indicated for disinfecting solutions.   Woke 3 days ago---usual hip and back pain, but now had intense pain in left shoulder Tried moving around--but got worse Then pain along lower left ribs--not real bad Ran some errands--then felt real tired in the afternoon Worsened with lying down---left shoulder/to neck and down her left arm and chest -----severe Called EMS---gave her aspirin and nitro Tried one tylenol before they took her  ER evaluation--- troponins negative x 2 CXR negative EKG--showed poor R wave progression (possible past MI)--both no change from 2014 There 6.5 hours---was slightly better. Then they discussed d-dimer---but she decided not to wait  Since then--still in pain  Tried 2 tylenol and it "just stopped" Still has some left lower rib pain---worse if she bends down. No clear tenderness though  Has pain with deep breath--no other SOB No cough No fever Did try heat on her rib area---it made it worse  Current Outpatient Medications on File Prior to Visit  Medication Sig Dispense Refill  . atorvastatin (LIPITOR) 40 MG tablet Take 1 tablet (40 mg total) by mouth daily. 90 tablet 3  . Calcium Carbonate-Vitamin D (CALCIUM + D PO) Take 1 tablet by mouth 2 (two) times daily before a meal.     . cetirizine (ZYRTEC) 10 MG tablet Take 10 mg by mouth daily as needed for allergies.    Marland Kitchen esomeprazole (NEXIUM) 40 MG capsule Take 1 capsule (40 mg total) by mouth daily. 90 capsule 3  . Multiple Vitamins-Minerals (OCUVITE ADULT 50+ PO) Take by mouth.    . Multiple Vitamins-Minerals (WOMENS MULTIVITAMIN  PLUS PO) Take 1 tablet by mouth daily.     . traMADol (ULTRAM) 50 MG tablet Take 1 tablet by mouth daily as needed.     No current facility-administered medications on file prior to visit.    Allergies  Allergen Reactions  . Penicillins Anaphylaxis  . Erythromycin Nausea And Vomiting  . Adhesive [Tape]     rash  . Amoxicillin     REACTION: unspecified  . Shellfish Allergy     welts  . Sulfamethoxazole Hives    REACTION: unspecified    Past Medical History:  Diagnosis Date  . Arthritis    back  . 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 DIVERTICULOSIS, COLON 04/20/2007  . GERD 04/20/2007  . H/O hiatal hernia   . Heart murmur    told by PCP- years ago-never had echo  . HEPATOMEGALY, HX OF 12/21/2009  . HYPERLIPIDEMIA 04/20/2007  . HYPERTENSION 04/20/2007  . LOW BACK PAIN 08/16/2008  . MENOPAUSAL SYNDROME 04/20/2007  . PEPTIC ULCER DISEASE 09/06/2010  . Personal history of chemotherapy    2015  . Personal history of radiation therapy    2015  . Wears glasses     Past Surgical History:  Procedure Laterality Date  . ABDOMINAL HYSTERECTOMY    . BREAST LUMPECTOMY Left    10/07/13 & 10/25/13  . BREAST LUMPECTOMY WITH NEEDLE LOCALIZATION Left 10/07/2013   Procedure: BREAST LUMPECTOMY WITH NEEDLE LOCALIZATION EXCISION OF BILATERAL AXILLARY  SKIN TAGS ;  Surgeon: Edward Jolly, MD;  Location: [facility];  Service: General;  Laterality: Left;  . BREAST SURGERY     30 rounds radiation  . CARPAL TUNNEL RELEASE Bilateral   . CHOLECYSTECTOMY    . COLONOSCOPY    . RE-EXCISION OF BREAST LUMPECTOMY Left 10/25/2013   Procedure: RE-EXCISION OF BREAST LUMPECTOMY;  Surgeon: Edward Jolly, MD;  Location: [facility];  Service: General;  Laterality: Left;  clean wound class  . TUBAL LIGATION      Family History  Problem Relation Age of Onset  . Hypertension Mother   . Hyperlipidemia Mother   . Heart disease Mother         congestive heart failure  . Congestive Heart Failure Mother   . Cancer Father        hx of melanoma  . Hypertension Sister   . Skin cancer Sister 65       basal cell carcinoma  . Hypertension Brother   . Heart disease Maternal Grandmother 71  . Breast cancer Maternal Aunt 60  . Colon cancer Paternal Grandmother 65  . Heart disease Maternal Aunt 42  . Prostate cancer Cousin     Social History   Socioeconomic History  . Marital status: Widowed    Spouse name: Not on file  . Number of children: 2  . Years of education: Not on file  . Highest education level: Not on file  Occupational History    Employer: [employer]    Comment: Retired  Tobacco Use  . Smoking status: Former Smoker    Quit date: 10/21/1978    Years since quitting: 42.4  . Smokeless tobacco: Never Used  Substance and Sexual Activity  . Alcohol use: No  . Drug use: No  . Sexual activity: Not Currently  Other Topics Concern  . 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   2 daughters      No living will   Daughters should be medical decision makers   Would accept resuscitation   Wouldn't want tube feeds if cognitively unaware   Social Determinants of Health   Financial Resource Strain: Not on file  Food Insecurity: Not on file  Transportation Needs: Not on file  Physical Activity: Not on file  Stress: Not on file  Social Connections: Not on file  Intimate Partner Violence: Not on file   Review of Systems Mild allergy symptoms Has been cleaning carpets lately--using upright vacuum and moving furniture  No recent travel    Objective:   Physical Exam Constitutional:      Appearance: Normal appearance.  Cardiovascular:     Rate and Rhythm: Normal rate and regular rhythm.     Pulses: Normal pulses.     Heart sounds: No gallop.      Comments: Very faint systolic murmur at ULSB Pulmonary:     Effort: Pulmonary effort is normal.     Breath  sounds: Normal breath sounds. No wheezing or rales.  Chest:     Chest wall: No tenderness.  Abdominal:     Palpations: Abdomen is soft. There is no mass.     Tenderness: There is no abdominal tenderness.  Musculoskeletal:     Cervical back: Neck supple.     Comments: Mild left shoulder bursa tenderness ROM fairly good in shoulder  Lymphadenopathy:     Cervical: No cervical adenopathy.  Neurological:  Mental Status: She is alert.  Psychiatric:        Mood and Affect: Mood normal.        Behavior: Behavior normal.            Assessment & Plan:

## 2021-03-16 NOTE — Telephone Encounter (Signed)
She has an appointment with [place] today.

## 2021-04-03 DIAGNOSIS — M25512 Pain in left shoulder: Secondary | ICD-10-CM | POA: Diagnosis not present

## 2021-04-03 DIAGNOSIS — M25552 Pain in left hip: Secondary | ICD-10-CM | POA: Diagnosis not present

## 2021-05-08 ENCOUNTER — Ambulatory Visit: Payer: Medicare HMO | Admitting: Internal Medicine

## 2021-05-14 ENCOUNTER — Telehealth: Payer: Self-pay | Admitting: *Deleted

## 2021-05-14 NOTE — Telephone Encounter (Signed)
Patient called stating that she was scheduled to see Dr. Silvio Pate last week and her appointment was cancelled and changed to 05/22/21. Patient stated that she is concerned and really does not want to wait that long. Patient stated that she has a history of radiation. Patient stated that she feels like she has torn something inside her throat. Patient feels like there is a bulge in her throat and it feels like a pill is struck in her throat. Patient scheduled for an appointment tomorrow 05/15/21 with Dr. Einar Pheasant at 12:00 noon. Patient requested that her appointment with Dr. Silvio Pate be cancelled for 05/22/21. Appointment cancelled.

## 2021-05-15 ENCOUNTER — Ambulatory Visit (INDEPENDENT_AMBULATORY_CARE_PROVIDER_SITE_OTHER): Payer: Medicare HMO | Admitting: Family Medicine

## 2021-05-15 ENCOUNTER — Other Ambulatory Visit: Payer: Self-pay

## 2021-05-15 ENCOUNTER — Encounter: Payer: Self-pay | Admitting: Family Medicine

## 2021-05-15 VITALS — BP 152/86 | HR 86 | Temp 98.4°F | Ht 62.0 in | Wt 174.0 lb

## 2021-05-15 DIAGNOSIS — R198 Other specified symptoms and signs involving the digestive system and abdomen: Secondary | ICD-10-CM

## 2021-05-15 DIAGNOSIS — Z853 Personal history of malignant neoplasm of breast: Secondary | ICD-10-CM | POA: Diagnosis not present

## 2021-05-15 DIAGNOSIS — K219 Gastro-esophageal reflux disease without esophagitis: Secondary | ICD-10-CM

## 2021-05-15 DIAGNOSIS — R0989 Other specified symptoms and signs involving the circulatory and respiratory systems: Secondary | ICD-10-CM | POA: Insufficient documentation

## 2021-05-15 NOTE — Telephone Encounter (Signed)
See note from today

## 2021-05-15 NOTE — Assessment & Plan Note (Signed)
S/p radiation treatment with ongoing chronic pain per patient, now with neck sensation. Discussed radiation as a risk factor and reassuring exam. Has oncology f/u in 2 weeks - if no [place] prior to appointment and neck symptom not resolve will discuss with them.

## 2021-05-15 NOTE — Assessment & Plan Note (Signed)
Exam reassuring. Etiology unclear - pt with PUD treated with nexium but denies heartburn or swallow symptoms. Hx of breast cancer w/radiation- no mass/lesion/lymphadenopathy identified. Movement triggers symptoms - at this point will initiate voltaren gel x 1 week to see if pain/sensation improves as suspect MSK origin. Though stressed if no improvement would recommend neck US to evaluate for small mass given prior radiation exposure.

## 2021-05-15 NOTE — Progress Notes (Signed)
Subjective:     Katie Becker is a 70 y.o. female presenting for throat pain      HPI  #Throat pain - hx of radiation due to breast cancer - has chronic cartilage issues on the left side - feels like there is a pill stuck in her throat - was putting on panty hoes and ended up with a really hard cough and had some chest pain - saw Dr. Silvio Pate and Dr. Lorre Nick for this - not with eating - no issues with swallowing - no issues with breathing - feels a sensation on the left side of the neck in the front - if she turns her head a lot or looks down too long this will trigger the symptoms - symptoms x 1 month - more noticeable now - not worse/better - no lesions noted  Does get some throat pain with over-eating    Review of Systems  Constitutional:  Negative for chills, fatigue, fever and unexpected weight change.  Respiratory:  Negative for shortness of breath.   Gastrointestinal:  Negative for abdominal pain, nausea and vomiting.       No heartburn symptoms  Endocrine: Positive for heat intolerance (chronic). Negative for cold intolerance.    Social History   Tobacco Use  Smoking Status Former   Types: Cigarettes   Quit date: 10/21/1978   Years since quitting: 42.5  Smokeless Tobacco Never        Objective:    BP Readings from Last 3 Encounters:  05/15/21 (!) 152/86  03/16/21 136/78  03/13/21 (!) 159/81   Wt Readings from Last 3 Encounters:  05/15/21 174 lb (78.9 kg)  03/16/21 174 lb (78.9 kg)  03/13/21 185 lb 3 oz (84 kg)    BP (!) 152/86   Pulse 86   Temp 98.4 F (36.9 C) (Temporal)   Ht '5\' 2"'$  (1.575 m)   Wt 174 lb (78.9 kg)   SpO2 96%   BMI 31.83 kg/m    Physical Exam Constitutional:      General: She is not in acute distress.    Appearance: She is well-developed. She is not diaphoretic.  HENT:     Right Ear: External ear normal.     Left Ear: External ear normal.     Nose: Nose normal.  Eyes:     Conjunctiva/sclera: Conjunctivae normal.   Neck:     Thyroid: No thyroid mass or thyromegaly.     Trachea: Trachea normal.  Cardiovascular:     Rate and Rhythm: Normal rate and regular rhythm.     Heart sounds: No murmur heard. Pulmonary:     Effort: Pulmonary effort is normal. No respiratory distress.     Breath sounds: Normal breath sounds. No wheezing.  Musculoskeletal:     Cervical back: Neck supple. No rigidity. No muscular tenderness.  Lymphadenopathy:     Cervical: No cervical adenopathy.  Skin:    General: Skin is warm and dry.     Capillary Refill: Capillary refill takes less than 2 seconds.  Neurological:     Mental Status: She is alert. Mental status is at baseline.  Psychiatric:        Mood and Affect: Mood normal.        Behavior: Behavior normal.          Assessment & Plan:   Problem List Items Addressed This Visit       Digestive   GERD    Stable on nexium 40 mg. Lower likelihood  as cause given pain with movement and no symptoms with swallowing. Though if [place] negative and persisting will consider additional GI work-up         Other   History of breast cancer    S/p radiation treatment with ongoing chronic pain per patient, now with neck sensation. Discussed radiation as a risk factor and reassuring exam. Has oncology f/u in 2 weeks - if no [place] prior to appointment and neck symptom not resolve will discuss with them.        Globus sensation - Primary    Exam reassuring. Etiology unclear - pt with PUD treated with nexium but denies heartburn or swallow symptoms. Hx of breast cancer w/radiation- no mass/lesion/lymphadenopathy identified. Movement triggers symptoms - at this point will initiate voltaren gel x 1 week to see if pain/sensation improves as suspect MSK origin. Though stressed if no improvement would recommend neck US to evaluate for small mass given prior radiation exposure.          Return if symptoms worsen or fail to improve.  Lesleigh Noe, MD  This visit occurred during the  SARS-CoV-2 public health emergency.  Safety protocols were in place, including screening questions prior to the visit, additional usage of staff PPE, and extensive cleaning of exam room while observing appropriate contact time as indicated for disinfecting solutions.

## 2021-05-15 NOTE — Assessment & Plan Note (Signed)
Stable on nexium 40 mg. Lower likelihood as cause given pain with movement and no symptoms with swallowing. Though if [place] negative and persisting will consider additional GI work-up

## 2021-05-15 NOTE — Patient Instructions (Addendum)
#  Neck sensation - try Voltaren Gel - apply to area of concern - if worsening or not improving - call and can order Ultrasound - Or wait and discuss at Oncology appointment  - exam is reassuring today

## 2021-05-21 ENCOUNTER — Encounter: Payer: Self-pay | Admitting: Family Medicine

## 2021-05-21 DIAGNOSIS — R198 Other specified symptoms and signs involving the digestive system and abdomen: Secondary | ICD-10-CM

## 2021-05-21 DIAGNOSIS — Z853 Personal history of malignant neoplasm of breast: Secondary | ICD-10-CM

## 2021-05-21 DIAGNOSIS — Z923 Personal history of irradiation: Secondary | ICD-10-CM

## 2021-05-21 DIAGNOSIS — R0989 Other specified symptoms and signs involving the circulatory and respiratory systems: Secondary | ICD-10-CM

## 2021-05-22 ENCOUNTER — Ambulatory Visit: Payer: Medicare HMO | Admitting: Internal Medicine

## 2021-05-26 NOTE — Progress Notes (Signed)
Patient Care Team: Venia Carbon, MD as PCP - General (Internal Medicine)  DIAGNOSIS:    ICD-10-CM   1. 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, unspecified estrogen receptor status ([place])  C50.512       SUMMARY OF ONCOLOGIC HISTORY: Oncology History  Breast cancer of lower-outer quadrant of left female breast ([place])  10/07/2013 Surgery   Left breast lumpectomy: DCIS ER positive PR positive, reexcision of positive margins on 10/25/2013, final margins negative    11/08/2013 - 12/27/2013 Radiation Therapy   Adjuvant radiation therapy    01/03/2014 Procedure   Genetic testing negative for breast and ovarian cancer panels    01/24/2014 - 10/16/2014 Anti-estrogen oral therapy   Tamoxifen 20 mg daily 5 years    09/13/2014 Relapse/Recurrence   MRI of the breast revealed a 3.5 cm area of non-mass enhancement left breast lateral 3.5 x 1.9 x 1.6 cm, oval circumscribed T2 bright enhancing mass right breast upper outer quadrant 1 x 0.7 cm, linear non-mass enhancement 11 x 6 x 3 mm    10/07/2014 Surgery   Left breast lumpectomy: DCIS grade 10.6 cm ER 100%, PR 0%, multiple skin tags benign fibroepithelial polyps, reexcision 10/25/2013 benign fibrocystic changes with calcifications    12/27/2014 -  Anti-estrogen oral therapy   Anastrozole 1 mg daily changed to letrozole 2.5 mg daily 01/05/2017due to musculoskeletal aches and pains      CHIEF COMPLIANT: Follow-up of recurrent left breast cancer on letrozole  INTERVAL HISTORY: Katie Becker is a 70 y.o. with above-mentioned history of recurrent left breast cancer treated with lumpectomy, radiation, and who is currently on antiestrogen therapy with letrozole. Mammogram on 10/11/20 showed no evidence of malignancy bilaterally. She presents to the clinic today for annual follow-up.  Her major complaints are related to osteoarthritis and weight gain.  Denies any lumps or nodules in the breast.  She does have stress and  strain from lifting heavy objects in the pectoral muscles.  ALLERGIES:  is allergic to penicillins, erythromycin, adhesive [tape], amoxicillin, shellfish allergy, and sulfamethoxazole.  MEDICATIONS:  Current Outpatient Medications  Medication Sig Dispense Refill   atorvastatin (LIPITOR) 40 MG tablet Take 1 tablet (40 mg total) by mouth daily. 90 tablet 3   Calcium Carbonate-Vitamin D (CALCIUM + D PO) Take 1 tablet by mouth 2 (two) times daily before a meal.      cetirizine (ZYRTEC) 10 MG tablet Take 10 mg by mouth daily as needed for allergies.     esomeprazole (NEXIUM) 40 MG capsule Take 1 capsule (40 mg total) by mouth daily. 90 capsule 3   Multiple Vitamins-Minerals (OCUVITE ADULT 50+ PO) Take by mouth.     Multiple Vitamins-Minerals (WOMENS MULTIVITAMIN PLUS PO) Take 1 tablet by mouth daily.      traMADol (ULTRAM) 50 MG tablet Take 1 tablet by mouth daily as needed.     No current facility-administered medications for this visit.    PHYSICAL EXAMINATION: ECOG PERFORMANCE STATUS: 1 - Symptomatic but completely ambulatory  Vitals:   05/28/21 1015  BP: (!) 143/64  Pulse: 84  Resp: 18  Temp: (!) 97.2 F (36.2 C)  SpO2: 98%   Filed Weights   05/28/21 1015  Weight: 175 lb 1.6 oz (79.4 kg)    BREAST: No palpable masses or nodules in either right or left breasts. No palpable axillary supraclavicular or infraclavicular adenopathy no breast tenderness or nipple discharge. (exam performed in the presence of a chaperone)  LABORATORY DATA:  I  have reviewed the data as listed CMP Latest Ref Rng & Units 03/13/2021 05/08/2020 07/07/2019  Glucose 70 - 99 mg/dL 111(H) 101(H) 115(H)  BUN 8 - 23 mg/dL '13 13 15  '$ Creatinine 0.44 - 1.00 mg/dL 0.96 0.89 0.88  Sodium 135 - 145 mmol/L 140 140 141  Potassium 3.5 - 5.1 mmol/L 3.8 4.6 4.1  Chloride 98 - 111 mmol/L 105 100 100  CO2 22 - 32 mmol/L 26 33(H) 31  Calcium 8.9 - 10.3 mg/dL 9.3 9.9 10.2  Total Protein 6.5 - 8.1 g/dL 6.9 - 7.4  Total  Bilirubin 0.3 - 1.2 mg/dL 1.2 - 0.7  Alkaline Phos 38 - 126 U/L 108 - 100  AST 15 - 41 U/L 30 - 75(H)  ALT 0 - 44 U/L 37 - 65(H)    Lab Results  Component Value Date   WBC 10.8 (H) 03/13/2021   HGB 14.3 03/13/2021   HCT 44.3 03/13/2021   MCV 87.5 03/13/2021   PLT 234 03/13/2021   NEUTROABS 7.8 (H) 03/13/2021    ASSESSMENT & PLAN:  Breast cancer of lower-outer quadrant of left female breast ([place]) 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 Breast exam 05/28/2021: No concerns on exam 2. Mammograms and ultrasound 10/11/2020, benign, breast density category C 3. Bone density December 2019 T score -0.8: Normal bone density. 4.  Breast MRI 07/10/20 Benign findings 1 year follow-up MRI of the left breast recommended  Chronic osteoarthritis: Weight gain: Because of lack of discipline regarding carbohydrate intake. She is planning to go to [place] in November. She hopes to lose weight and next year she wants to be around 145 pounds.  Return to clinic in 1 year for follow-up    No orders of the defined types were placed in this encounter.  The patient has a good understanding of the overall plan. she agrees with it. she will call with any problems that may develop before the next visit here.  Total time spent: 20 mins including face to face time and time spent for planning, charting and coordination of care  Rulon Eisenmenger, MD, MPH 05/28/2021  I, Thana Ates, am acting as scribe for Dr. Nicholas Lose.  I have reviewed the above documentation for accuracy and completeness, and I agree with the above.

## 2021-05-27 NOTE — Assessment & Plan Note (Signed)
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 Breast exam 05/28/2021:No concerns on exam 2. Mammograms and ultrasound12/22/2021, benign, breast density category C 3. Bone densityDecember 2019 T score -0.8: Normal bone density. 4.Breast MRI 07/10/20 Benign findings 1 year follow-up MRI of the left breast recommended   Return to clinic in 1 year for follow-up

## 2021-05-28 ENCOUNTER — Other Ambulatory Visit: Payer: Self-pay

## 2021-05-28 ENCOUNTER — Inpatient Hospital Stay: Payer: Medicare HMO | Attending: Hematology and Oncology | Admitting: Hematology and Oncology

## 2021-05-28 DIAGNOSIS — C50512 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: Secondary | ICD-10-CM | POA: Diagnosis not present

## 2021-05-28 DIAGNOSIS — Z79811 Long term (current) use of aromatase inhibitors: Secondary | ICD-10-CM | POA: Insufficient documentation

## 2021-05-28 DIAGNOSIS — Z79899 Other long term (current) drug therapy: Secondary | ICD-10-CM | POA: Diagnosis not present

## 2021-05-28 DIAGNOSIS — Z170 Estrogen receptor positive status [ER+]: Secondary | ICD-10-CM | POA: Diagnosis not present

## 2021-05-31 DIAGNOSIS — Z01419 Encounter for gynecological examination (general) (routine) without abnormal findings: Secondary | ICD-10-CM | POA: Diagnosis not present

## 2021-06-06 NOTE — Telephone Encounter (Signed)
Sending to Dr Einar Pheasant and referral pool

## 2021-06-12 ENCOUNTER — Other Ambulatory Visit: Payer: Self-pay | Admitting: Family Medicine

## 2021-06-12 ENCOUNTER — Encounter: Payer: Self-pay | Admitting: Hematology and Oncology

## 2021-06-12 ENCOUNTER — Ambulatory Visit
Admission: RE | Admit: 2021-06-12 | Discharge: 2021-06-12 | Disposition: A | Discharge status: Home / Self Care | Payer: Medicare HMO | Source: Ambulatory Visit | Attending: Family Medicine | Admitting: Family Medicine

## 2021-06-12 DIAGNOSIS — Z923 Personal history of irradiation: Secondary | ICD-10-CM

## 2021-06-12 DIAGNOSIS — R0989 Other specified symptoms and signs involving the circulatory and respiratory systems: Secondary | ICD-10-CM

## 2021-06-12 DIAGNOSIS — R198 Other specified symptoms and signs involving the digestive system and abdomen: Secondary | ICD-10-CM

## 2021-06-12 DIAGNOSIS — Z853 Personal history of malignant neoplasm of breast: Secondary | ICD-10-CM

## 2021-06-12 DIAGNOSIS — E041 Nontoxic single thyroid nodule: Secondary | ICD-10-CM | POA: Diagnosis not present

## 2021-06-14 DIAGNOSIS — Z20828 Contact with and (suspected) exposure to other viral communicable diseases: Secondary | ICD-10-CM | POA: Diagnosis not present

## 2021-07-17 ENCOUNTER — Ambulatory Visit (INDEPENDENT_AMBULATORY_CARE_PROVIDER_SITE_OTHER): Payer: Medicare HMO | Admitting: Internal Medicine

## 2021-07-17 ENCOUNTER — Other Ambulatory Visit: Payer: Self-pay

## 2021-07-17 ENCOUNTER — Encounter: Payer: Self-pay | Admitting: Internal Medicine

## 2021-07-17 VITALS — BP 136/86 | HR 77 | Temp 97.5°F | Ht 61.5 in | Wt 172.0 lb

## 2021-07-17 DIAGNOSIS — M545 Low back pain, unspecified: Secondary | ICD-10-CM

## 2021-07-17 DIAGNOSIS — Z0000 Encounter for general adult medical examination without abnormal findings: Secondary | ICD-10-CM

## 2021-07-17 DIAGNOSIS — I10 Essential (primary) hypertension: Secondary | ICD-10-CM

## 2021-07-17 DIAGNOSIS — E785 Hyperlipidemia, unspecified: Secondary | ICD-10-CM

## 2021-07-17 DIAGNOSIS — Z23 Encounter for immunization: Secondary | ICD-10-CM | POA: Diagnosis not present

## 2021-07-17 DIAGNOSIS — K2100 Gastro-esophageal reflux disease with esophagitis, without bleeding: Secondary | ICD-10-CM

## 2021-07-17 DIAGNOSIS — G8929 Other chronic pain: Secondary | ICD-10-CM | POA: Diagnosis not present

## 2021-07-17 DIAGNOSIS — Z7189 Other specified counseling: Secondary | ICD-10-CM | POA: Diagnosis not present

## 2021-07-17 MED ORDER — EPINEPHRINE 0.3 MG/0.3ML IJ SOAJ: 0.3000 mg | INTRAMUSCULAR | 1 refills | Status: DC | PRN

## 2021-07-17 NOTE — Progress Notes (Signed)
Subjective:    Patient ID: Katie Becker, female    DOB: 1951/04/03, 70 y.o.   MRN: [id number]  HPI Here for Medicare wellness visit and follow up of chronic health conditions This visit occurred during the SARS-CoV-2 public health emergency.  Safety protocols were in place, including screening questions prior to the visit, additional usage of staff PPE, and extensive cleaning of exam room while observing appropriate contact time as indicated for disinfecting solutions.   Reviewed advanced directives Reviewed other doctors----Dr Gudena--oncology, Dr Myrtie Neither medicine, Dr Marta Lamas, Dr Winnifred Friar No hospitalizations or surgery Vision is okay--early cataract Hearing is very good No alcohol or tobacco May have had 1 fall--or just stumbles. No injury No depression or anhedonia Independent with instrumental ADLs Some mild recall issues--but no major memory issues  Still has knot and redness in left arm from bivalent COVID vaccine Is better though Did do recent antibody report---did have antibodies  Stress brings on acid symptoms Thinks cough may have aggravated it Known hiatal hernia Continues on the nexium---back down to once a day No dysphagia  Off HCTZ now BP seems okay Did have gout in toe---no problems since drinking more water  Will have intermittent back pain Has compressed vertebrae--continues with sports medicine doctor  No Rx for breast cancer (off med now)  Continues on statin No problems  Current Outpatient Medications on File Prior to Visit  Medication Sig Dispense Refill   atorvastatin (LIPITOR) 40 MG tablet Take 1 tablet (40 mg total) by mouth daily. 90 tablet 3   Calcium Carbonate-Vitamin D (CALCIUM + D PO) Take 1 tablet by mouth 2 (two) times daily before a meal.      cetirizine (ZYRTEC) 10 MG tablet Take 10 mg by mouth daily as needed for allergies.     esomeprazole (NEXIUM) 40 MG capsule Take 1 capsule (40 mg total) by mouth daily. 90  capsule 3   Multiple Vitamins-Minerals (OCUVITE ADULT 50+ PO) Take by mouth.     Multiple Vitamins-Minerals (WOMENS MULTIVITAMIN PLUS PO) Take 1 tablet by mouth daily.      traMADol (ULTRAM) 50 MG tablet Take 1 tablet by mouth daily as needed.     No current facility-administered medications on file prior to visit.    Allergies  Allergen Reactions   Penicillins Anaphylaxis   Erythromycin Nausea And Vomiting   Adhesive [Tape]     rash   Amoxicillin     REACTION: unspecified   Shellfish Allergy     welts   Sulfamethoxazole Hives    REACTION: unspecified    Past Medical History:  Diagnosis Date   Arthritis    back   Breast cancer ([place])    Left Breast - ductal Carcinoma In-Situ - diagnosed on MRI- biopsy   DIVERTICULOSIS, COLON 04/20/2007   GERD 04/20/2007   H/O hiatal hernia    Heart murmur    told by PCP- years ago-never had echo   HEPATOMEGALY, HX OF 12/21/2009   HYPERLIPIDEMIA 04/20/2007   HYPERTENSION 04/20/2007   LOW BACK PAIN 08/16/2008   MENOPAUSAL SYNDROME 04/20/2007   PEPTIC ULCER DISEASE 09/06/2010   Personal history of chemotherapy    2015   Personal history of radiation therapy    2015   Wears glasses     Past Surgical History:  Procedure Laterality Date   ABDOMINAL HYSTERECTOMY     BREAST LUMPECTOMY Left    10/07/13 & 10/25/13   BREAST LUMPECTOMY WITH NEEDLE LOCALIZATION Left 10/07/2013   Procedure: BREAST LUMPECTOMY WITH  NEEDLE LOCALIZATION EXCISION OF BILATERAL AXILLARY SKIN TAGS ;  Surgeon: Edward Jolly, MD;  Location: [facility];  Service: General;  Laterality: Left;   BREAST SURGERY     30 rounds radiation   CARPAL TUNNEL RELEASE Bilateral    CHOLECYSTECTOMY     COLONOSCOPY     RE-EXCISION OF BREAST LUMPECTOMY Left 10/25/2013   Procedure: RE-EXCISION OF BREAST LUMPECTOMY;  Surgeon: Edward Jolly, MD;  Location: [facility];  Service: General;  Laterality: Left;  clean wound class   TUBAL LIGATION      Family History  Problem  Relation Age of Onset   Hypertension Mother    Hyperlipidemia Mother    Heart disease Mother        congestive heart failure   Congestive Heart Failure Mother    Cancer Father        hx of melanoma   Hypertension Sister    Skin cancer Sister 52       basal cell carcinoma   Hypertension Brother    Heart disease Maternal Grandmother 74   Breast cancer Maternal Aunt 60   Colon cancer Paternal Grandmother 91   Heart disease Maternal Aunt 42   Prostate cancer Cousin     Social History   Socioeconomic History   Marital status: Widowed    Spouse name: Not on file   Number of children: 2   Years of education: Not on file   Highest education level: Not on file  Occupational History    Employer: [employer]    Comment: Retired  Tobacco Use   Smoking status: Former    Types: Cigarettes    Quit date: 10/21/1978    Years since quitting: 42.7   Smokeless tobacco: Never  Substance and Sexual Activity   Alcohol use: No   Drug use: No   Sexual activity: Not Currently  Other Topics Concern   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   2 daughters      No living will   Daughters should be medical decision makers   Would accept resuscitation   Wouldn't want tube feeds if cognitively unaware   Social Determinants of Health   Financial Resource Strain: Not on file  Food Insecurity: Not on file  Transportation Needs: Not on file  Physical Activity: Not on file  Stress: Not on file  Social Connections: Not on file  Intimate Partner Violence: Not on file   Review of Systems Wants epipen to take with her to [place]---due to shellfish allergy Working on healthier eating Has lost a few pounds Sleeps okay--not very long Wears seat belt Teeth okay--keeps up with dentist No skin issues or rash Bowels move fine --no blood No dysuria or incontinence No other joint pains--just the back and hands. Tylenol and rare tramadol Rare  zyrtec for pollen symptoms    Objective:   Physical Exam Constitutional:      Appearance: Normal appearance.  HENT:     Mouth/Throat:     Comments: No lesions Eyes:     Conjunctiva/sclera: Conjunctivae normal.     Pupils: Pupils are equal, round, and reactive to light.  Cardiovascular:     Rate and Rhythm: Normal rate and regular rhythm.     Pulses: Normal pulses.     Heart sounds: No murmur heard.   No gallop.  Pulmonary:     Effort: Pulmonary effort is normal.  Breath sounds: Normal breath sounds. No wheezing or rales.  Abdominal:     Palpations: Abdomen is soft.     Tenderness: There is no abdominal tenderness.  Musculoskeletal:     Cervical back: Neck supple.     Right lower leg: No edema.     Left lower leg: No edema.  Lymphadenopathy:     Cervical: No cervical adenopathy.  Skin:    Findings: No lesion or rash.  Neurological:     Mental Status: She is alert and oriented to person, place, and time.     Comments: President---"BidenSheela Stack---- Obama" 100-93-83- not good at numbers D-l-r-o-w Recall 3/3  Psychiatric:        Mood and Affect: Mood normal.        Behavior: Behavior normal.           Assessment & Plan:

## 2021-07-17 NOTE — Assessment & Plan Note (Signed)
Uses tylenol and rare tramadol

## 2021-07-17 NOTE — Addendum Note (Signed)
Addended by: Pilar Grammes on: 07/17/2021 05:02 PM   Modules accepted: Orders

## 2021-07-17 NOTE — Assessment & Plan Note (Signed)
Mostly controlled with daily nexium

## 2021-07-17 NOTE — Assessment & Plan Note (Signed)
See social history Blank forms given 

## 2021-07-17 NOTE — Assessment & Plan Note (Signed)
BP Readings from Last 3 Encounters:  07/17/21 136/86  05/28/21 (!) 143/64  05/15/21 (!) 152/86   BP still okay off the HCTZ

## 2021-07-17 NOTE — Assessment & Plan Note (Signed)
I have personally reviewed the Medicare Annual Wellness questionnaire and have noted 1. The patient's medical and social history 2. Their use of alcohol, tobacco or illicit drugs 3. Their current medications and supplements 4. The patient's functional ability including ADL's, fall risks, home safety risks and hearing or visual             impairment. 5. Diet and physical activities 6. Evidence for depression or mood disorders  The patients weight, height, BMI and visual acuity have been recorded in the chart I have made referrals, counseling and provided education to the patient based review of the above and I have provided the pt with a written personalized care plan for preventive services.  I have provided you with a copy of your personalized plan for preventive services. Please take the time to review along with your updated medication list.  Colonoscopy due 2026 Yearly mammogram Had bivalent COVID--with local reaction Flu vaccine today Discussed exercise

## 2021-07-17 NOTE — Assessment & Plan Note (Signed)
Tolerating atorvastatin for primary prevention

## 2021-07-24 IMAGING — MG DIGITAL DIAGNOSTIC BILAT W/ TOMO W/ CAD
8 series · 8 of 24 positions shown · non-contrast
Comparison: 06/22/2019 and earlier

CLINICAL DATA: History of LEFT lumpectomy with radiation therapy
and chemotherapy 3517. Recent MRI and ultrasound confirmed multiple
sites of fat necrosis in the LEFT breast.

Patient reports shooting pain from the scar into the LEFT axilla on
3 occasions recently. She palpates no mass.
EXAM:
DIGITAL DIAGNOSTIC BILATERAL MAMMOGRAM WITH CAD AND TOMO
ULTRASOUND LEFT BREAST

[R CC synth-2D]
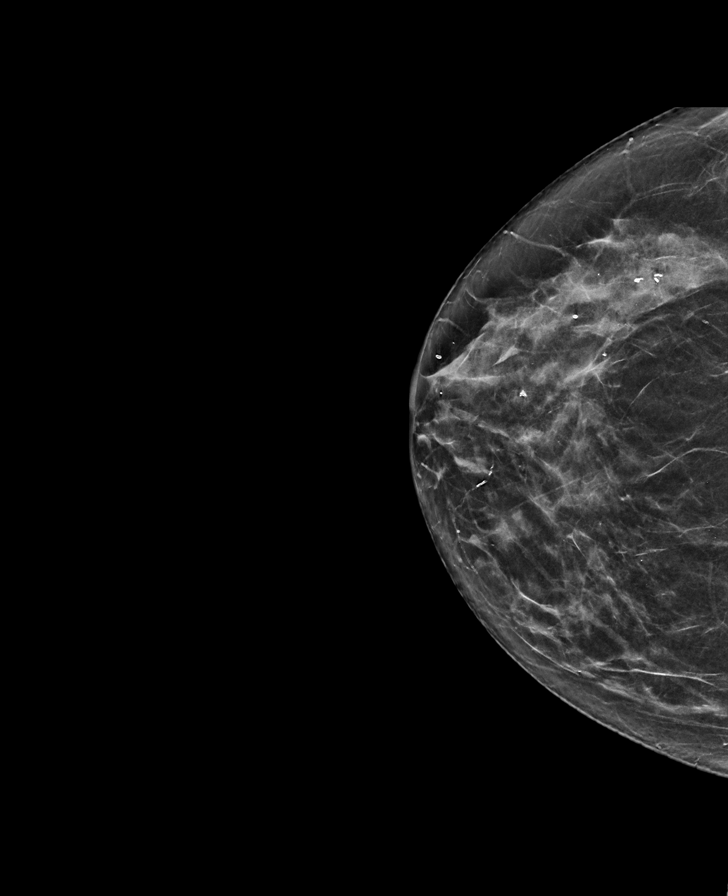

[R MLO synth-2D]
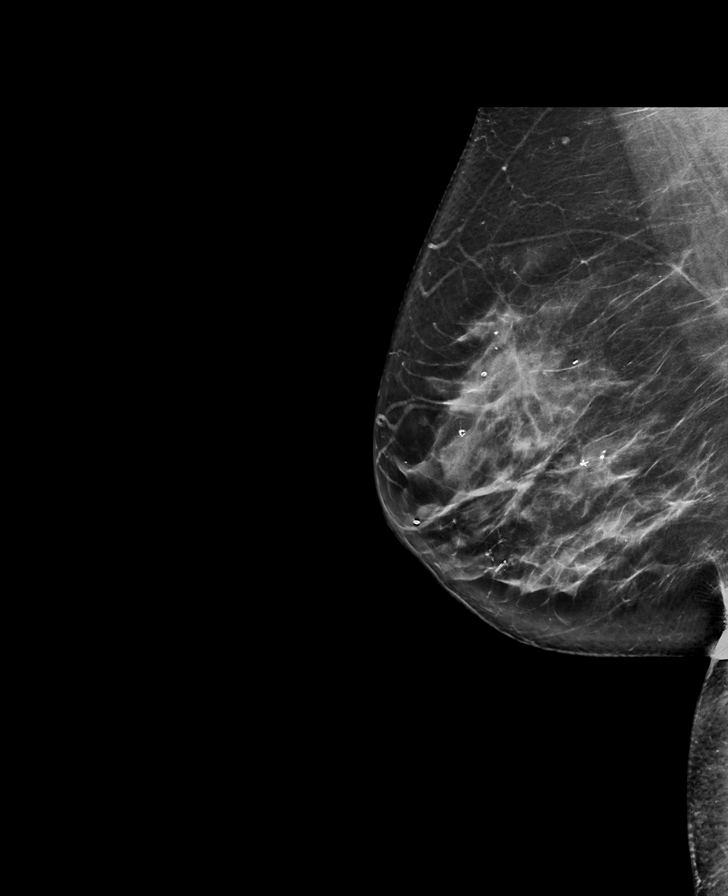

[L CC synth-2D]
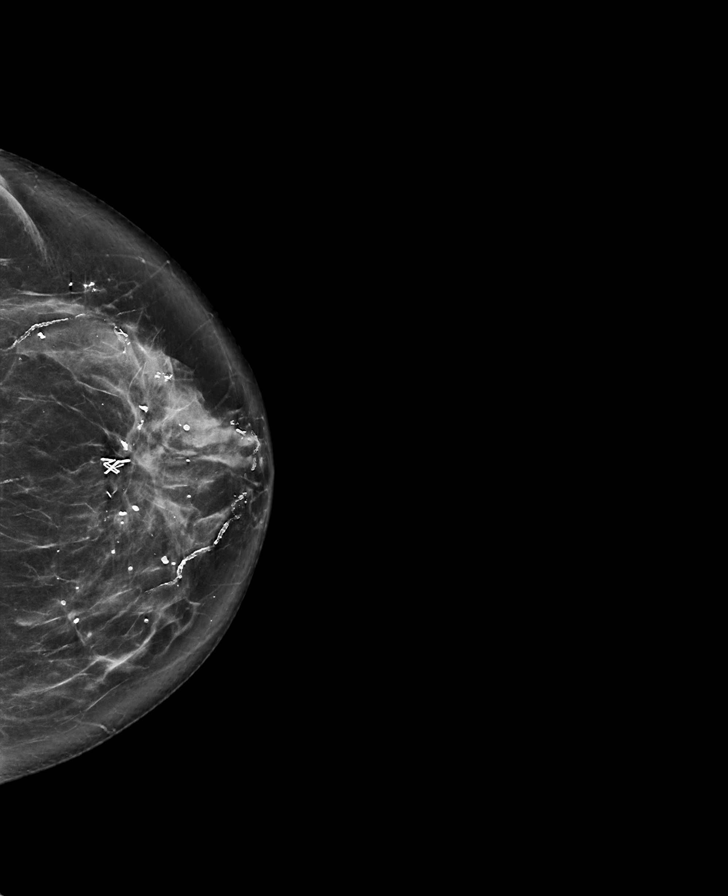

[L MLO synth-2D]
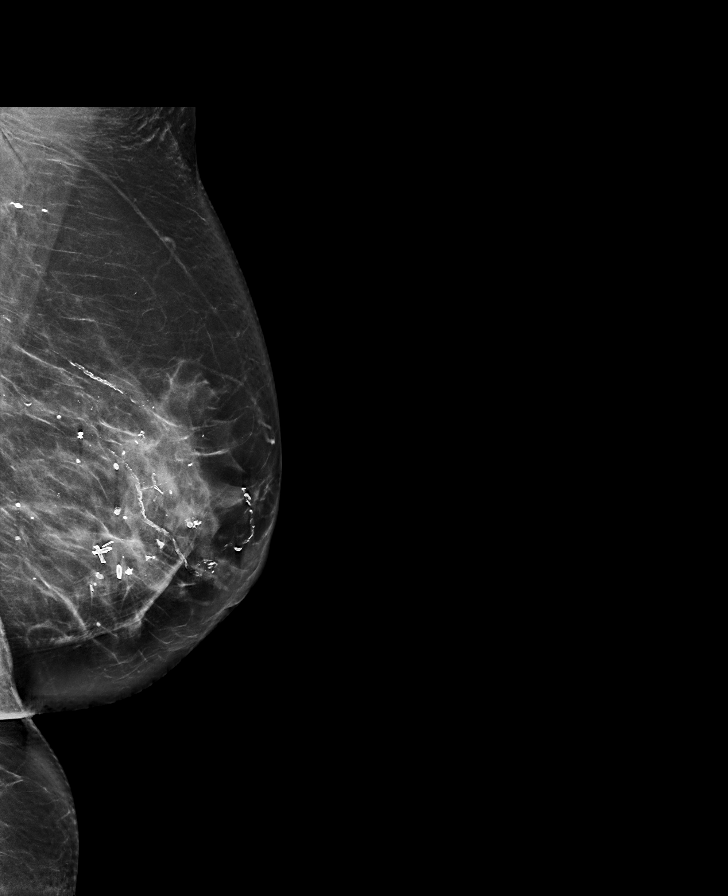

[R CC tomo · tomo slice 33/66.0]
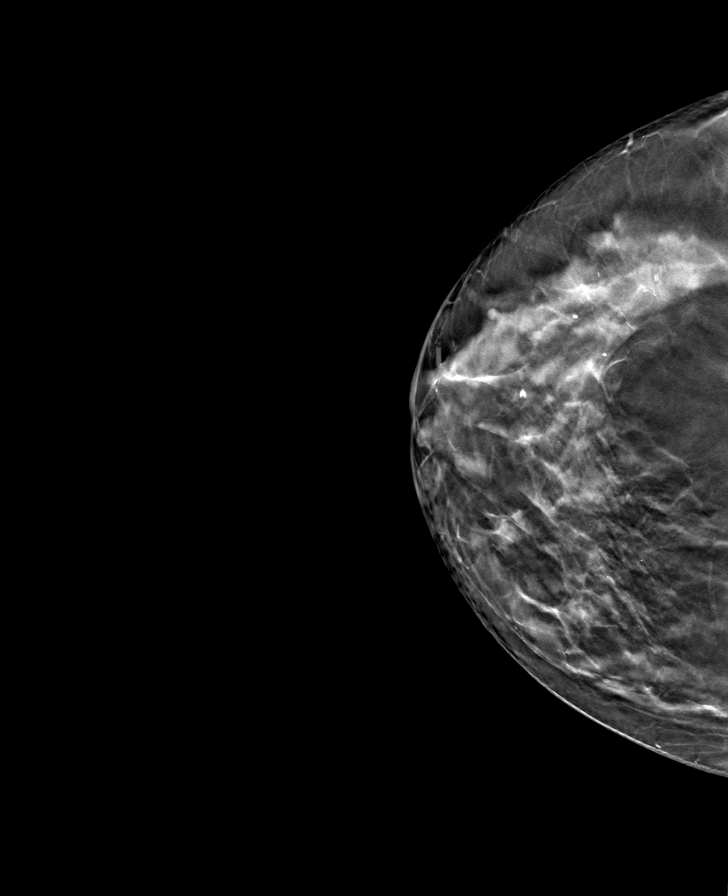

[L CC tomo · tomo slice 41/81.0]
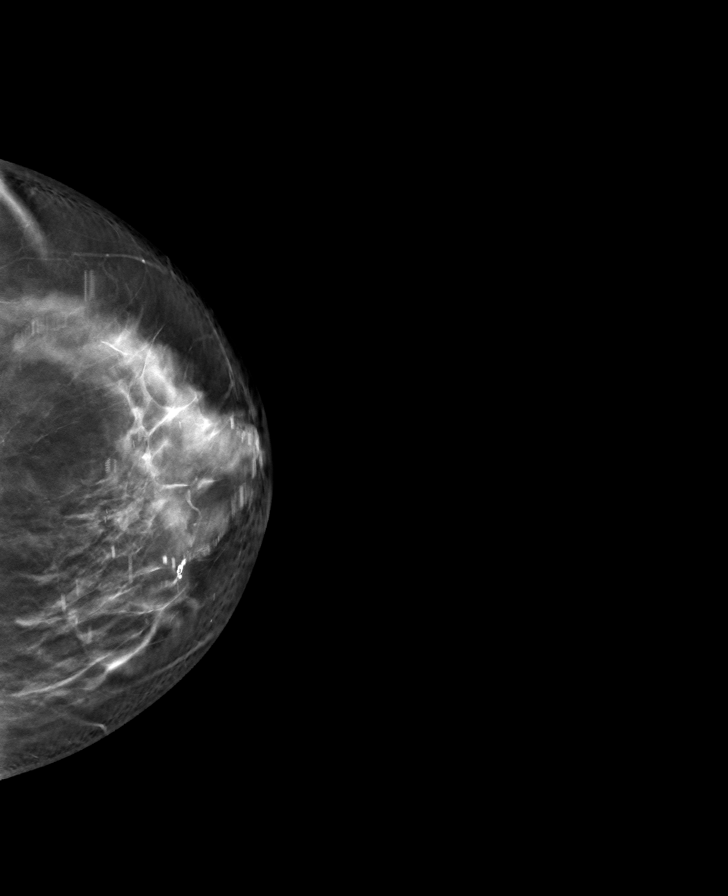

[L MLO tomo · tomo slice 43/84.0]
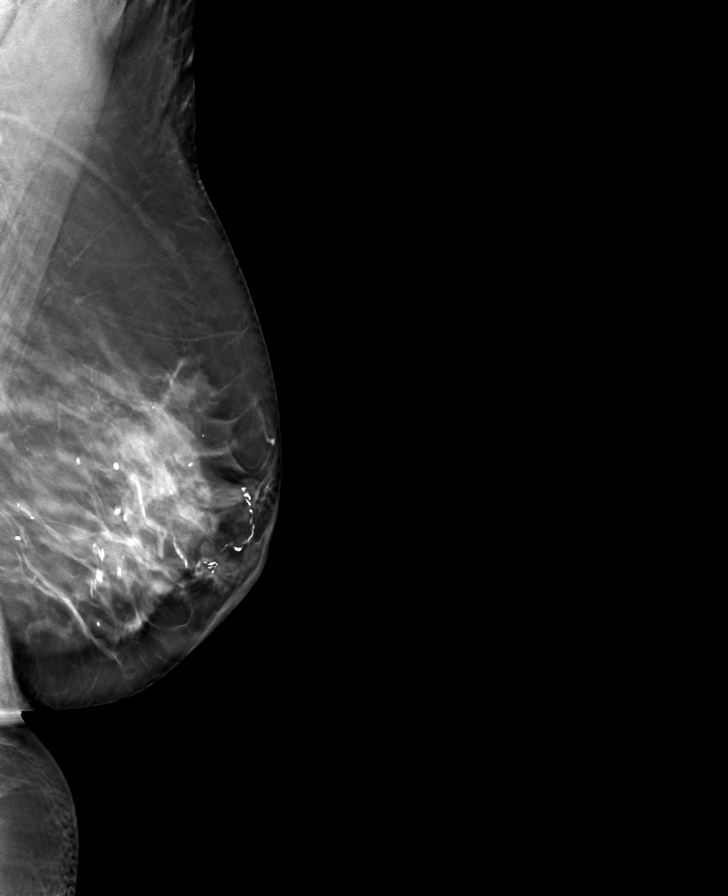

[R MLO tomo · tomo slice 42/83.0]
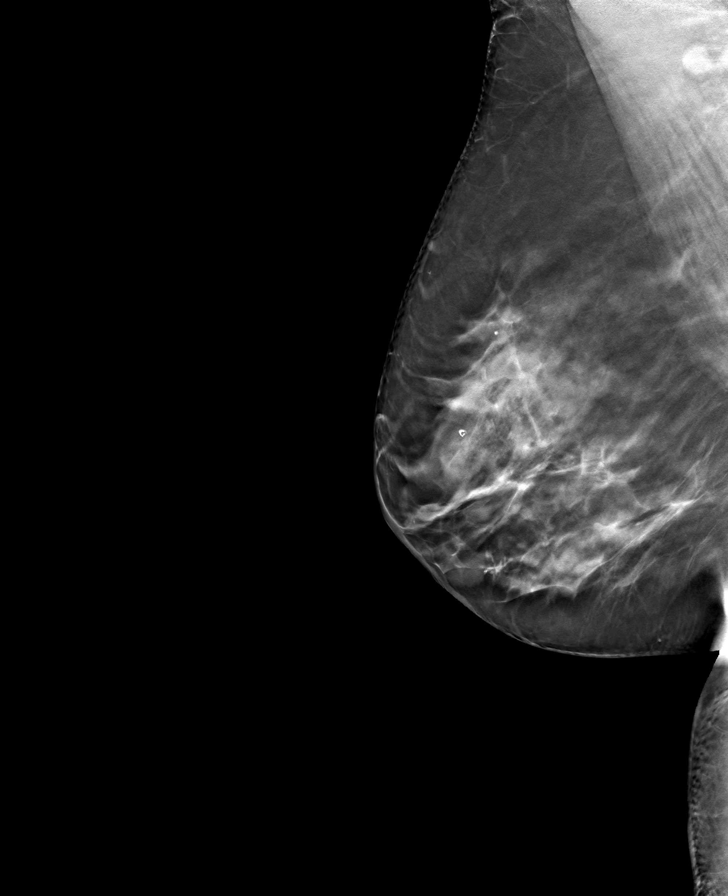

[8 of 24 positions shown; findings below may reference images not displayed]

ACR Breast Density Category c: The breast tissue is heterogeneously
dense, which may obscure small masses.
FINDINGS: Post operative changes are seen in the LEFTbreast. Multiple coarse
calcifications are consistent with fat necrosis bilaterally. No
suspicious mass, distortion, or microcalcifications are identified
to suggest presence of malignancy in either breast.

Mammographic images were processed with CAD.

There is

On physical exam, a well-healed scar in the LATERAL periareolar
region of the LEFT breast. I palpate no discrete mass in the LATERAL
aspect of the LEFT breast.

Targeted ultrasound is performed, showing numerous areas of fat
necrosis associated with coarse calcifications primarily in the
periareolar region and LATERAL portion of the LEFT breast.
Otherwise, no new or suspicious findings seen sonographically.
IMPRESSION: No mammographic or ultrasound evidence for malignancy.

RECOMMENDATION:
Screening mammogram in one year.(Code:[id number])

Patient also has screening MRI exams performed annually, next one
for May 2020.

I have discussed the findings and recommendations with the patient.
If applicable, a reminder letter will be sent to the patient
regarding the next appointment.

BI-RADS CATEGORY  2: Benign.

## 2021-08-08 DIAGNOSIS — M25512 Pain in left shoulder: Secondary | ICD-10-CM | POA: Diagnosis not present

## 2021-08-08 DIAGNOSIS — M79622 Pain in left upper arm: Secondary | ICD-10-CM | POA: Diagnosis not present

## 2021-08-11 ENCOUNTER — Other Ambulatory Visit: Payer: Self-pay

## 2021-08-13 MED ORDER — TRAMADOL HCL 50 MG PO TABS: 50.0000 mg | ORAL_TABLET | Freq: Every day | ORAL | 0 refills | Status: AC | PRN

## 2021-08-13 NOTE — Telephone Encounter (Signed)
Last filled 05-08-20 #30 Last OV 07-17-21 Next OV 07-23-22 [facility]

## 2021-08-14 MED ORDER — ESOMEPRAZOLE MAGNESIUM 40 MG PO CPDR: 40.0000 mg | DELAYED_RELEASE_CAPSULE | Freq: Every day | ORAL | 3 refills | Status: DC

## 2021-08-16 DIAGNOSIS — H5213 Myopia, bilateral: Secondary | ICD-10-CM | POA: Diagnosis not present

## 2021-08-16 DIAGNOSIS — Z135 Encounter for screening for eye and ear disorders: Secondary | ICD-10-CM | POA: Diagnosis not present

## 2021-08-16 DIAGNOSIS — H25813 Combined forms of age-related cataract, bilateral: Secondary | ICD-10-CM | POA: Diagnosis not present

## 2021-08-16 DIAGNOSIS — H524 Presbyopia: Secondary | ICD-10-CM | POA: Diagnosis not present

## 2021-08-29 ENCOUNTER — Other Ambulatory Visit: Payer: Self-pay | Admitting: Internal Medicine

## 2021-08-29 DIAGNOSIS — Z1231 Encounter for screening mammogram for malignant neoplasm of breast: Secondary | ICD-10-CM

## 2021-09-05 DIAGNOSIS — Z20822 Contact with and (suspected) exposure to covid-19: Secondary | ICD-10-CM | POA: Diagnosis not present

## 2021-09-26 ENCOUNTER — Telehealth: Payer: Self-pay | Admitting: Internal Medicine

## 2021-09-26 NOTE — Telephone Encounter (Signed)
  Encourage patient to contact the pharmacy for refills or they can request refills through [facility]:  Please schedule appointment if longer than 1 year  NEXT APPOINTMENT DATE:07/23/2022  MEDICATION:atorvastatin (LIPITOR) 40 MG tablet  Is the patient out of medication?   [place] to Registered [facility]  Let patient know to contact pharmacy at the end of the day to make sure medication is ready.  Please notify patient to allow 48-72 hours to process  CLINICAL FILLS OUT ALL BELOW:   LAST REFILL:  QTY:  REFILL DATE:    OTHER COMMENTS:    Okay for refill?  Please advise

## 2021-09-27 MED ORDER — ATORVASTATIN CALCIUM 40 MG PO TABS: 40.0000 mg | ORAL_TABLET | Freq: Every day | ORAL | 3 refills | Status: DC

## 2021-10-11 ENCOUNTER — Ambulatory Visit
Admission: RE | Admit: 2021-10-11 | Discharge: 2021-10-11 | Disposition: A | Discharge status: Home / Self Care | Payer: Medicare HMO | Source: Ambulatory Visit | Attending: Internal Medicine | Admitting: Internal Medicine

## 2021-10-11 DIAGNOSIS — Z1231 Encounter for screening mammogram for malignant neoplasm of breast: Secondary | ICD-10-CM

## 2021-10-12 ENCOUNTER — Encounter: Payer: Self-pay | Admitting: Internal Medicine

## 2021-10-18 DIAGNOSIS — M25551 Pain in right hip: Secondary | ICD-10-CM | POA: Diagnosis not present

## 2021-10-23 ENCOUNTER — Encounter: Payer: Self-pay | Admitting: Internal Medicine

## 2021-10-31 ENCOUNTER — Ambulatory Visit (INDEPENDENT_AMBULATORY_CARE_PROVIDER_SITE_OTHER): Payer: Medicare HMO | Admitting: Internal Medicine

## 2021-10-31 ENCOUNTER — Other Ambulatory Visit: Payer: Self-pay

## 2021-10-31 ENCOUNTER — Encounter: Payer: Self-pay | Admitting: Internal Medicine

## 2021-10-31 VITALS — BP 140/82 | HR 75 | Temp 97.9°F | Ht 62.0 in | Wt 167.0 lb

## 2021-10-31 DIAGNOSIS — R0989 Other specified symptoms and signs involving the circulatory and respiratory systems: Secondary | ICD-10-CM | POA: Diagnosis not present

## 2021-10-31 NOTE — Progress Notes (Signed)
Subjective:    Patient ID: Katie Becker, female    DOB: September 02, 1951, 71 y.o.   MRN: [id number]  HPI Here due to throat/reflux symptoms  Recent trip to [place] and [place] Walking 4-7 miles a day Lost 8#  Was seen in July after bending over and having spasmotic cough Since then--will get occasional pressure sensation in mid substernal  Not really much burning or true indigestion like before (being careful with eating and avoiding greasy food) Does use some tums with stress---will have mild symptoms Continues on the nexium No dysphagia  Still gets a sensation in neck/throat with bending over or occasionally in bed Will stand up and it stops  Current Outpatient Medications on File Prior to Visit  Medication Sig Dispense Refill   atorvastatin (LIPITOR) 40 MG tablet Take 1 tablet (40 mg total) by mouth daily. 90 tablet 3   Calcium Carbonate-Vitamin D (CALCIUM + D PO) Take 1 tablet by mouth 2 (two) times daily before a meal.      cetirizine (ZYRTEC) 10 MG tablet Take 10 mg by mouth daily as needed for allergies.     EPINEPHrine 0.3 mg/0.3 mL IJ SOAJ injection Inject 0.3 mg into the muscle as needed for anaphylaxis. 1 each 1   esomeprazole (NEXIUM) 40 MG capsule Take 1 capsule (40 mg total) by mouth daily. 90 capsule 3   Multiple Vitamins-Minerals (OCUVITE ADULT 50+ PO) Take by mouth.     Multiple Vitamins-Minerals (WOMENS MULTIVITAMIN PLUS PO) Take 1 tablet by mouth daily.      traMADol (ULTRAM) 50 MG tablet Take 1 tablet (50 mg total) by mouth daily as needed. 30 tablet 0   No current facility-administered medications on file prior to visit.    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   DIVERTICULOSIS, COLON 04/20/2007   GERD 04/20/2007   H/O hiatal hernia    Heart murmur    told by PCP- years ago-never had echo   HEPATOMEGALY, HX OF 12/21/2009   HYPERLIPIDEMIA 04/20/2007   HYPERTENSION 04/20/2007   LOW BACK PAIN 08/16/2008   MENOPAUSAL SYNDROME 04/20/2007   PEPTIC ULCER DISEASE 09/06/2010   Personal history of chemotherapy    2015   Personal history of radiation therapy    2015   Wears glasses     Past Surgical History:  Procedure Laterality Date   ABDOMINAL HYSTERECTOMY     BREAST LUMPECTOMY Left    10/07/13 & 10/25/13   BREAST LUMPECTOMY WITH NEEDLE LOCALIZATION Left 10/07/2013   Procedure: BREAST LUMPECTOMY WITH NEEDLE LOCALIZATION EXCISION OF BILATERAL AXILLARY SKIN TAGS ;  Surgeon: Edward Jolly, MD;  Location: [facility];  Service: General;  Laterality: Left;   BREAST SURGERY     30 rounds radiation   CARPAL TUNNEL RELEASE Bilateral    CHOLECYSTECTOMY     COLONOSCOPY     RE-EXCISION OF BREAST LUMPECTOMY Left 10/25/2013   Procedure: RE-EXCISION OF BREAST LUMPECTOMY;  Surgeon: Edward Jolly, MD;  Location: [facility];  Service: General;  Laterality: Left;  clean wound class   TUBAL LIGATION      Family History  Problem Relation Age of Onset   Hypertension Mother    Hyperlipidemia Mother    Heart disease Mother        congestive heart failure   Congestive Heart Failure Mother    Cancer Father        hx of melanoma   Hypertension Sister    Skin cancer Sister 52       basal cell carcinoma   Hypertension Brother    Heart disease Maternal Grandmother 50   Breast cancer Maternal Aunt 60   Colon cancer Paternal Grandmother 91   Heart disease Maternal Aunt 42   Prostate cancer Cousin     Social History   Socioeconomic History   Marital status: Widowed    Spouse name: Not on file   Number of children: 2   Years of  education: Not on file   Highest education level: Not on file  Occupational History    Employer: [employer]    Comment: Retired  Tobacco Use   Smoking status: Former    Types: Cigarettes    Quit date: 10/21/1978    Years since quitting: 43.0   Smokeless tobacco: Never  Substance and Sexual Activity   Alcohol use: No   Drug use: No   Sexual activity: Not Currently  Other Topics Concern   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  HENT:     Mouth/Throat:     Pharynx: No oropharyngeal exudate or posterior oropharyngeal erythema.  Musculoskeletal:     Cervical back: Neck supple.  Lymphadenopathy:     Cervical: No cervical adenopathy.  Neurological:     Mental Status: She is alert.           Assessment & Plan:

## 2021-10-31 NOTE — Assessment & Plan Note (Signed)
Has symptoms only with bending over No regular reflux and dysphagia Is on the nexium  Likely nothing worrisome Will set up with ENT to be sure no laryngeal lesion

## 2021-12-18 ENCOUNTER — Other Ambulatory Visit: Payer: Self-pay

## 2021-12-18 ENCOUNTER — Ambulatory Visit (INDEPENDENT_AMBULATORY_CARE_PROVIDER_SITE_OTHER): Payer: Medicare HMO | Admitting: Internal Medicine

## 2021-12-18 ENCOUNTER — Encounter: Payer: Self-pay | Admitting: Internal Medicine

## 2021-12-18 DIAGNOSIS — J209 Acute bronchitis, unspecified: Secondary | ICD-10-CM | POA: Insufficient documentation

## 2021-12-18 NOTE — Progress Notes (Signed)
Subjective:    Patient ID: Katie Becker, female    DOB: Jun 13, 1951, 71 y.o.   MRN: [id number]  HPI Here due to respiratory infection  Started a week ago Nephew came into town--had cough and she got exposed Had dry cough and post nasal drip at first Started to go to chest (bronchial areas) Took OTC--coricidin cough and cold----not really helpful Is "gushy wet" at times (bronchial) and dry after the OTC med Ongoing dry cough  No fever No SOB No change in chronic headaches No ear pain  Current Outpatient Medications on File Prior to Visit  Medication Sig Dispense Refill   atorvastatin (LIPITOR) 40 MG tablet Take 1 tablet (40 mg total) by mouth daily. 90 tablet 3   Calcium Carbonate-Vitamin D (CALCIUM + D PO) Take 1 tablet by mouth 2 (two) times daily before a meal.      cetirizine (ZYRTEC) 10 MG tablet Take 10 mg by mouth daily as needed for allergies.     EPINEPHrine 0.3 mg/0.3 mL IJ SOAJ injection Inject 0.3 mg into the muscle as needed for anaphylaxis. 1 each 1   esomeprazole (NEXIUM) 40 MG capsule Take 1 capsule (40 mg total) by mouth daily. 90 capsule 3   Multiple Vitamins-Minerals (OCUVITE ADULT 50+ PO) Take by mouth.     Multiple Vitamins-Minerals (WOMENS MULTIVITAMIN PLUS PO) Take 1 tablet by mouth daily.      traMADol (ULTRAM) 50 MG tablet Take 1 tablet (50 mg total) by mouth daily as needed. 30 tablet 0   No current facility-administered medications on file prior to visit.    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   DIVERTICULOSIS, COLON 04/20/2007   GERD 04/20/2007   H/O hiatal hernia    Heart murmur     told by PCP- years ago-never had echo   HEPATOMEGALY, HX OF 12/21/2009   HYPERLIPIDEMIA 04/20/2007   HYPERTENSION 04/20/2007   LOW BACK PAIN 08/16/2008   MENOPAUSAL SYNDROME 04/20/2007   PEPTIC ULCER DISEASE 09/06/2010   Personal history of chemotherapy    2015   Personal history of radiation therapy    2015   Wears glasses     Past Surgical History:  Procedure Laterality Date   ABDOMINAL HYSTERECTOMY     BREAST LUMPECTOMY Left    10/07/13 & 10/25/13   BREAST LUMPECTOMY WITH NEEDLE LOCALIZATION Left 10/07/2013   Procedure: BREAST LUMPECTOMY WITH NEEDLE LOCALIZATION EXCISION OF BILATERAL AXILLARY SKIN TAGS ;  Surgeon: Edward Jolly, MD;  Location: [facility];  Service: General;  Laterality: Left;   BREAST SURGERY     30 rounds radiation   CARPAL TUNNEL RELEASE Bilateral    CHOLECYSTECTOMY     COLONOSCOPY     RE-EXCISION OF BREAST LUMPECTOMY Left 10/25/2013   Procedure: RE-EXCISION OF BREAST LUMPECTOMY;  Surgeon: Edward Jolly, MD;  Location: [facility];  Service: General;  Laterality: Left;  clean wound class   TUBAL LIGATION      Family History  Problem Relation Age of Onset   Hypertension  Mother    Hyperlipidemia Mother    Heart disease Mother        congestive heart failure   Congestive Heart Failure Mother    Cancer Father        hx of melanoma   Hypertension Sister    Skin cancer Sister 60       basal cell carcinoma   Hypertension Brother    Heart disease Maternal Grandmother 33   Breast cancer Maternal Aunt 60   Colon cancer Paternal Grandmother 91   Heart disease Maternal Aunt 42   Prostate cancer Cousin     Social History   Socioeconomic History   Marital status: Widowed    Spouse name: Not on file   Number of children: 2   Years of education: Not on file   Highest education level: Not on file  Occupational History    Employer: [employer]    Comment: Retired  Tobacco Use   Smoking status: Former    Types:  Cigarettes    Quit date: 10/21/1978    Years since quitting: 43.1    Passive exposure: Never   Smokeless tobacco: Never  Substance and Sexual Activity   Alcohol use: No   Drug use: No   Sexual activity: Not Currently  Other Topics Concern   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  HENT:     Right Ear: Tympanic membrane and ear canal normal.     Left Ear: Tympanic membrane and ear canal normal.     Nose:     Comments: Minimal inflammation    Mouth/Throat:     Pharynx: No oropharyngeal exudate or posterior oropharyngeal erythema.  Pulmonary:     Effort: Pulmonary effort is normal.     Breath sounds: Normal breath sounds. No wheezing or rales.  Musculoskeletal:     Cervical back: Neck supple.  Lymphadenopathy:     Cervical: No cervical adenopathy.  Neurological:     Mental Status: She is alert.           Assessment & Plan:

## 2021-12-18 NOTE — Assessment & Plan Note (Signed)
Seems to have self limited viral infection Discussed supportive care If worsens (especially with sinus) would consider empiric Rx Discussed cough suppressant

## 2021-12-24 ENCOUNTER — Encounter: Payer: Self-pay | Admitting: Internal Medicine

## 2022-01-01 DIAGNOSIS — Z87892 Personal history of anaphylaxis: Secondary | ICD-10-CM | POA: Diagnosis not present

## 2022-01-01 DIAGNOSIS — R030 Elevated blood-pressure reading, without diagnosis of hypertension: Secondary | ICD-10-CM | POA: Diagnosis not present

## 2022-01-01 DIAGNOSIS — Z008 Encounter for other general examination: Secondary | ICD-10-CM | POA: Diagnosis not present

## 2022-01-01 DIAGNOSIS — K219 Gastro-esophageal reflux disease without esophagitis: Secondary | ICD-10-CM | POA: Diagnosis not present

## 2022-01-01 DIAGNOSIS — E785 Hyperlipidemia, unspecified: Secondary | ICD-10-CM | POA: Diagnosis not present

## 2022-01-01 DIAGNOSIS — Z8249 Family history of ischemic heart disease and other diseases of the circulatory system: Secondary | ICD-10-CM | POA: Diagnosis not present

## 2022-01-01 DIAGNOSIS — Z6832 Body mass index (BMI) 32.0-32.9, adult: Secondary | ICD-10-CM | POA: Diagnosis not present

## 2022-01-01 DIAGNOSIS — Z853 Personal history of malignant neoplasm of breast: Secondary | ICD-10-CM | POA: Diagnosis not present

## 2022-01-01 DIAGNOSIS — Z809 Family history of malignant neoplasm, unspecified: Secondary | ICD-10-CM | POA: Diagnosis not present

## 2022-01-01 DIAGNOSIS — E669 Obesity, unspecified: Secondary | ICD-10-CM | POA: Diagnosis not present

## 2022-01-01 DIAGNOSIS — G8929 Other chronic pain: Secondary | ICD-10-CM | POA: Diagnosis not present

## 2022-01-01 DIAGNOSIS — Z87891 Personal history of nicotine dependence: Secondary | ICD-10-CM | POA: Diagnosis not present

## 2022-01-01 DIAGNOSIS — Z880 Allergy status to penicillin: Secondary | ICD-10-CM | POA: Diagnosis not present

## 2022-01-12 ENCOUNTER — Encounter: Payer: Self-pay | Admitting: Hematology and Oncology

## 2022-02-05 ENCOUNTER — Encounter: Payer: Self-pay | Admitting: Internal Medicine

## 2022-02-25 DIAGNOSIS — J342 Deviated nasal septum: Secondary | ICD-10-CM | POA: Diagnosis not present

## 2022-02-25 DIAGNOSIS — J343 Hypertrophy of nasal turbinates: Secondary | ICD-10-CM | POA: Diagnosis not present

## 2022-02-25 DIAGNOSIS — R0989 Other specified symptoms and signs involving the circulatory and respiratory systems: Secondary | ICD-10-CM | POA: Diagnosis not present

## 2022-02-25 DIAGNOSIS — K219 Gastro-esophageal reflux disease without esophagitis: Secondary | ICD-10-CM | POA: Diagnosis not present

## 2022-04-29 ENCOUNTER — Other Ambulatory Visit: Payer: Self-pay

## 2022-04-29 ENCOUNTER — Inpatient Hospital Stay (HOSPITAL_BASED_OUTPATIENT_CLINIC_OR_DEPARTMENT_OTHER): Payer: Medicare HMO | Admitting: Physician Assistant

## 2022-04-29 ENCOUNTER — Encounter: Payer: Self-pay | Admitting: Hematology and Oncology

## 2022-04-29 ENCOUNTER — Inpatient Hospital Stay: Payer: Medicare HMO | Attending: Physician Assistant

## 2022-04-29 ENCOUNTER — Other Ambulatory Visit: Payer: Self-pay | Admitting: Physician Assistant

## 2022-04-29 VITALS — BP 158/78 | HR 68 | Temp 98.0°F | Resp 16 | Ht 62.0 in | Wt 172.2 lb

## 2022-04-29 DIAGNOSIS — Z170 Estrogen receptor positive status [ER+]: Secondary | ICD-10-CM | POA: Diagnosis not present

## 2022-04-29 DIAGNOSIS — Z79899 Other long term (current) drug therapy: Secondary | ICD-10-CM | POA: Insufficient documentation

## 2022-04-29 DIAGNOSIS — C50512 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: Secondary | ICD-10-CM

## 2022-04-29 DIAGNOSIS — N644 Mastodynia: Secondary | ICD-10-CM

## 2022-04-29 DIAGNOSIS — Z79811 Long term (current) use of aromatase inhibitors: Secondary | ICD-10-CM | POA: Insufficient documentation

## 2022-04-29 LAB — CBC WITH DIFFERENTIAL (CANCER CENTER ONLY)
Abs Immature Granulocytes: 0.01 10*3/uL (ref 0.00–0.07)
Basophils Absolute: 0 10*3/uL (ref 0.0–0.1)
Basophils Relative: 0 %
Eosinophils Absolute: 0.2 10*3/uL (ref 0.0–0.5)
Eosinophils Relative: 3 %
HCT: 41.6 % (ref 36.0–46.0)
Hemoglobin: 14.1 g/dL (ref 12.0–15.0)
Immature Granulocytes: 0 %
Lymphocytes Relative: 31 %
Lymphs Abs: 1.9 10*3/uL (ref 0.7–4.0)
MCH: 28.7 pg (ref 26.0–34.0)
MCHC: 33.9 g/dL (ref 30.0–36.0)
MCV: 84.6 fL (ref 80.0–100.0)
Monocytes Absolute: 0.4 10*3/uL (ref 0.1–1.0)
Monocytes Relative: 6 %
Neutro Abs: 3.5 10*3/uL (ref 1.7–7.7)
Neutrophils Relative %: 60 %
Platelet Count: 195 10*3/uL (ref 150–400)
RBC: 4.92 MIL/uL (ref 3.87–5.11)
RDW: 12.7 % (ref 11.5–15.5)
WBC Count: 5.9 10*3/uL (ref 4.0–10.5)
nRBC: 0 % (ref 0.0–0.2)

## 2022-04-29 LAB — CMP (CANCER CENTER ONLY)
ALT: 22 U/L (ref 0–44)
AST: 22 U/L (ref 15–41)
Albumin: 4.2 g/dL (ref 3.5–5.0)
Alkaline Phosphatase: 91 U/L (ref 38–126)
Anion gap: 5 (ref 5–15)
BUN: 12 mg/dL (ref 8–23)
CO2: 28 mmol/L (ref 22–32)
Calcium: 9.3 mg/dL (ref 8.9–10.3)
Chloride: 108 mmol/L (ref 98–111)
Creatinine: 0.95 mg/dL (ref 0.44–1.00)
GFR, Estimated: >60 mL/min (ref >60)
Glucose, Bld: 130 mg/dL — ABNORMAL HIGH (ref 70–99)
Potassium: 3.9 mmol/L (ref 3.5–5.1)
Sodium: 141 mmol/L (ref 135–145)
Total Bilirubin: 0.6 mg/dL (ref 0.3–1.2)
Total Protein: 6.9 g/dL (ref 6.5–8.1)

## 2022-04-29 NOTE — Progress Notes (Signed)
CBC/CMP for [facility] appointment.

## 2022-04-29 NOTE — Progress Notes (Signed)
Symptom Management Consult note [place]    Patient Care Team: Venia Carbon, MD as PCP - General (Internal Medicine)    Name of the patient: Katie Becker  [id number]  Jul 16, 1951   Date of visit: 04/29/2022    Chief complaint/ Reason for visit- left breast pain  Oncology History  Breast cancer of lower-outer quadrant of left female breast ([place])  10/07/2013 Surgery   Left breast lumpectomy: DCIS ER positive PR positive, reexcision of positive margins on 10/25/2013, final margins negative   11/08/2013 - 12/27/2013 Radiation Therapy   Adjuvant radiation therapy   01/03/2014 Procedure   Genetic testing negative for breast and ovarian cancer panels   01/24/2014 - 10/16/2014 Anti-estrogen oral therapy   Tamoxifen 20 mg daily 5 years   09/13/2014 Relapse/Recurrence   MRI of the breast revealed a 3.5 cm area of non-mass enhancement left breast lateral 3.5 x 1.9 x 1.6 cm, oval circumscribed T2 bright enhancing mass right breast upper outer quadrant 1 x 0.7 cm, linear non-mass enhancement 11 x 6 x 3 mm   10/07/2014 Surgery   Left breast lumpectomy: DCIS grade 10.6 cm ER 100%, PR 0%, multiple skin tags benign fibroepithelial polyps, reexcision 10/25/2013 benign fibrocystic changes with calcifications   12/27/2014 - 2021 Anti-estrogen oral therapy   Anastrozole 1 mg daily changed to letrozole 2.5 mg daily 01/05/2017due to musculoskeletal aches and pains     Current Therapy: Letrozole stopped in 2021  Interval history- Katie Becker is a 71 y.o. with oncologic history as above presenting to [facility] today with chief complaint of left breast pain x 2 days. Patient describes pain as stinging and "hot pain" located in her left axilla.  Pain does not radiate.  She rates the pain 5/10 in severity. Pain is constant. She denies pain being worse with movement and denies any injury to the area.  Patient denies history of similar pain.  Patient admits that she has intermittent left  breast pain since finishing radiation that is unchanged today.  She denies any skin or nipple changes.  She has not tried any over-the-counter medications for pain. She denies any chest pain, shortness of breath, extremity swelling, numbness, tingling, weakness, rash.    ROS  All other systems are reviewed and are negative for acute change except as noted in the HPI.    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   DIVERTICULOSIS, COLON 04/20/2007   GERD 04/20/2007   H/O hiatal hernia    Heart murmur    told by PCP- years ago-never had echo   HEPATOMEGALY, HX OF 12/21/2009   HYPERLIPIDEMIA 04/20/2007   HYPERTENSION 04/20/2007   LOW BACK PAIN 08/16/2008   MENOPAUSAL SYNDROME 04/20/2007   PEPTIC ULCER DISEASE 09/06/2010   Personal history of chemotherapy    2015   Personal history of radiation therapy    2015   Wears glasses      Past Surgical History:  Procedure Laterality Date   ABDOMINAL HYSTERECTOMY     BREAST LUMPECTOMY  Left    10/07/13 & 10/25/13   BREAST LUMPECTOMY WITH NEEDLE LOCALIZATION Left 10/07/2013   Procedure: BREAST LUMPECTOMY WITH NEEDLE LOCALIZATION EXCISION OF BILATERAL AXILLARY SKIN TAGS ;  Surgeon: Edward Jolly, MD;  Location: [facility];  Service: General;  Laterality: Left;   BREAST SURGERY     30 rounds radiation   CARPAL TUNNEL RELEASE Bilateral    CHOLECYSTECTOMY     COLONOSCOPY     RE-EXCISION OF BREAST LUMPECTOMY Left 10/25/2013   Procedure: RE-EXCISION OF BREAST LUMPECTOMY;  Surgeon: Edward Jolly, MD;  Location: [facility];  Service: General;  Laterality: Left;  clean wound  class   TUBAL LIGATION      Social History   Socioeconomic History   Marital status: Widowed    Spouse name: Not on file   Number of children: 2   Years of education: Not on file   Highest education level: Not on file  Occupational History    Employer: [employer]    Comment: Retired  Tobacco Use   Smoking status: Former    Types: Cigarettes    Quit date: 10/21/1978    Years since quitting: 43.5    Passive exposure: Never   Smokeless tobacco: Never  Substance and Sexual Activity   Alcohol use: No   Drug use: No   Sexual activity: Not Currently  Other Topics Concern   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   2 daughters      No living will   Daughters should be medical decision makers   Would accept resuscitation   Wouldn't want tube feeds if cognitively unaware   Social Determinants of Health   Financial Resource Strain: Not on file  Food Insecurity: Not on file  Transportation Needs: Not on file  Physical Activity: Not on file  Stress: Not on file  Social Connections: Not on file  Intimate Partner Violence: Not on file    Family History  Problem Relation Age of Onset   Hypertension Mother    Hyperlipidemia Mother    Heart disease Mother        congestive heart failure   Congestive Heart Failure Mother    Cancer Father        hx of melanoma   Hypertension Sister    Skin cancer Sister 28       basal cell carcinoma   Hypertension Brother    Heart disease Maternal Grandmother 52   Breast cancer Maternal Aunt 60   Colon cancer Paternal Grandmother 91   Heart disease Maternal Aunt 42   Prostate cancer Cousin      Current Outpatient Medications:    atorvastatin (LIPITOR) 40 MG tablet, Take 1 tablet (40 mg total) by mouth daily., Disp: 90 tablet, Rfl: 3   Calcium Carbonate-Vitamin D (CALCIUM + D PO), Take 1 tablet by mouth 2 (two) times daily before a meal. , Disp: , Rfl:    cetirizine (ZYRTEC) 10 MG  tablet, Take 10 mg by mouth daily as needed for allergies., Disp: , Rfl:    EPINEPHrine 0.3 mg/0.3 mL IJ SOAJ injection, Inject 0.3 mg into the muscle as needed for anaphylaxis., Disp: 1 each, Rfl: 1   esomeprazole (NEXIUM) 40 MG capsule, Take 1 capsule (40 mg total) by mouth daily., Disp: 90 capsule, Rfl: 3   Multiple Vitamins-Minerals (OCUVITE ADULT 50+ PO), Take by mouth., Disp: , Rfl:    Multiple Vitamins-Minerals (WOMENS  MULTIVITAMIN PLUS PO), Take 1 tablet by mouth daily. , Disp: , Rfl:    traMADol (ULTRAM) 50 MG tablet, Take 1 tablet (50 mg total) by mouth daily as needed., Disp: 30 tablet, Rfl: 0  PHYSICAL EXAM: ECOG FS:1 - Symptomatic but completely ambulatory    Vitals:   04/29/22 1103  BP: (!) 158/78  Pulse: 68  Resp: 16  Temp: 98 F (36.7 C)  TempSrc: Oral  SpO2: 99%  Weight: 172 lb 3.2 oz (78.1 kg)  Height: '5\' 2"'$  (1.575 m)   Physical Exam Vitals and nursing note reviewed.  Constitutional:      Appearance: She is well-developed. She is not ill-appearing or toxic-appearing.  HENT:     Head: Normocephalic and atraumatic.     Nose: Nose normal.  Eyes:     General: No scleral icterus.       Right eye: No discharge.        Left eye: No discharge.     Conjunctiva/sclera: Conjunctivae normal.  Neck:     Vascular: No JVD.  Cardiovascular:     Rate and Rhythm: Normal rate and regular rhythm.     Pulses: Normal pulses.     Heart sounds: Normal heart sounds.  Pulmonary:     Effort: Pulmonary effort is normal.     Breath sounds: Normal breath sounds.  Chest:       Comments:  No palpable masses or nodules in left breast.  No palpable axillary, supraclavicular or infraclavicular adenopathy.  No breast tenderness or nipple discharge. Abdominal:     General: There is no distension.  Musculoskeletal:        General: Normal range of motion.     Cervical back: Normal range of motion.     Comments: Compartments in left upper extremity are soft.  No swelling of left upper  extremity.  Skin:    General: Skin is warm and dry.  Neurological:     Mental Status: She is oriented to person, place, and time.     GCS: GCS eye subscore is 4. GCS verbal subscore is 5. GCS motor subscore is 6.     Comments: Fluent speech, no facial droop.  Psychiatric:        Behavior: Behavior normal.        LABORATORY DATA: I have reviewed the data as listed    Latest Ref Rng & Units 04/29/2022   10:49 AM 03/13/2021    8:23 PM 05/08/2020    4:18 PM  CBC  WBC 4.0 - 10.5 K/uL 5.9  10.8  8.2   Hemoglobin 12.0 - 15.0 g/dL 14.1  14.3  14.3   Hematocrit 36.0 - 46.0 % 41.6  44.3  42.2   Platelets 150 - 400 K/uL 195  234  224.0         Latest Ref Rng & Units 04/29/2022   10:49 AM 03/13/2021    8:23 PM 05/08/2020    4:18 PM  CMP  Glucose 70 - 99 mg/dL 130  111  101   BUN 8 - 23 mg/dL '12  13  13   '$ Creatinine 0.44 - 1.00 mg/dL 0.95  0.96  0.89   Sodium 135 - 145 mmol/L 141  140  140   Potassium 3.5 - 5.1 mmol/L 3.9  3.8  4.6   Chloride 98 - 111 mmol/L 108  105  100   CO2 22 - 32 mmol/L 28  26  33   Calcium 8.9 - 10.3 mg/dL 9.3  9.3  9.9   Total Protein 6.5 - 8.1 g/dL 6.9  6.9    Total Bilirubin 0.3 - 1.2 mg/dL 0.6  1.2    Alkaline Phos 38 - 126 U/L 91  108    AST 15 - 41 U/L 22  30    ALT 0 - 44 U/L 22  37         RADIOGRAPHIC STUDIES: I have personally reviewed the radiological images as listed and agreed with the findings in the report. No images are attached to the encounter. No results found.   ASSESSMENT & PLAN: Patient is a 71 y.o. female  with oncologic history of malignant neoplasm of lower-outer quadrant of left breast followed by Dr. Lindi Adie.  I have viewed most recent oncology note and lab work.    #)Breast pain- Patient is nontoxic-appearing.  Breast exam without palpable masses or adenopathy.  Pain is located in anterior axillary line and is reproducible.  No gross abscess.  No overlying skin changes.  DDx includes MSK pectoral strain, deep abscess,  shingles, less likely to be breast cancer recurrence.  There are no physical exam findings to suggest shingles however based on how patient is describing the pain this is a possibility of developing shingles rash.  Patient agreeable with plan to have imaging for further evaluation.  Diagnostic mammogram and ultrasound ordered and scheduled for 05/02/2022.  Discussed with patient possible treatments to try at home including heat and Tylenol.  CBC and CMP today are overall unremarkable.  #)Malignant neoplasm of lower-outer quadrant of left breast-  Next appointment with oncologist is 08/08  Strict ED precautions discussed should symptoms worsen.   Visit Diagnosis: 1. 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, unspecified estrogen receptor status ([place])   2. Breast pain      Orders Placed This Encounter  Procedures   [place] LT BREAST BX W LOC DEV 1ST LESION IMG BX SPEC US GUIDE    Standing Status:   Future    Standing Expiration Date:   04/30/2023    Order Specific Question:   Reason for Exam (SYMPTOM  OR DIAGNOSIS REQUIRED)    Answer:   breast pain    Order Specific Question:   Preferred location?    Answer:   [facility]   US BREAST LTD UNI LEFT INC AXILLA    OZY:YQMGN  PREV:10/11/2021 '@BCG'$   YES SURGERY HX OF LUMPECTOMY LEFT / NO IMPLANTS OR REDUCTION/ NO BREAST CANCER// NO NEEDS AJ SW [facility]AC[facility] '@DR'$  OFC  PT AWARE $75 NO SHOW/CANCELLATION FEE WITHIN 24 HOURS  SEND FOR COSIGN 04/29/2022    Standing Status:   Future    Standing Expiration Date:   04/30/2023    Order Specific Question:   Reason for Exam (SYMPTOM  OR DIAGNOSIS REQUIRED)    Answer:   breast pain LEFT     Order Specific Question:   Preferred imaging location?    Answer:   [facility]    All questions were answered. The patient knows to call the clinic with any problems, questions or concerns. No barriers to learning was detected.  I have spent a total of 20 minutes minutes of face-to-face and  non-face-to-face time, preparing to see the patient, obtaining and/or reviewing separately obtained history, performing a medically appropriate examination, counseling and educating the patient, ordering tests,  documenting clinical information in the electronic health record, and care coordination.     Thank you for allowing me to participate in the care of  this patient.    Barrie Folk, PA-C Department of Hematology/Oncology [facility] Phone: [phone]  [phone]     04/29/2022 5:03 PM

## 2022-05-02 ENCOUNTER — Ambulatory Visit: Payer: Medicare HMO

## 2022-05-02 ENCOUNTER — Ambulatory Visit
Admission: RE | Admit: 2022-05-02 | Discharge: 2022-05-02 | Disposition: A | Discharge status: Home / Self Care | Payer: Medicare HMO | Source: Ambulatory Visit | Attending: Physician Assistant | Admitting: Physician Assistant

## 2022-05-02 DIAGNOSIS — Z86000 Personal history of in-situ neoplasm of breast: Secondary | ICD-10-CM | POA: Diagnosis not present

## 2022-05-02 DIAGNOSIS — N644 Mastodynia: Secondary | ICD-10-CM | POA: Diagnosis not present

## 2022-05-02 DIAGNOSIS — Z853 Personal history of malignant neoplasm of breast: Secondary | ICD-10-CM | POA: Diagnosis not present

## 2022-05-08 DIAGNOSIS — M25551 Pain in right hip: Secondary | ICD-10-CM | POA: Diagnosis not present

## 2022-05-17 DIAGNOSIS — M25551 Pain in right hip: Secondary | ICD-10-CM | POA: Diagnosis not present

## 2022-05-20 NOTE — Progress Notes (Signed)
Patient Care Team: Venia Carbon, MD as PCP - General (Internal Medicine)  DIAGNOSIS:  Encounter Diagnosis  Name Primary?   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, unspecified estrogen receptor status ([place])     SUMMARY OF ONCOLOGIC HISTORY: Oncology History  Breast cancer of lower-outer quadrant of left female breast ([place])  10/07/2013 Surgery   Left breast lumpectomy: DCIS ER positive PR positive, reexcision of positive margins on 10/25/2013, final margins negative   11/08/2013 - 12/27/2013 Radiation Therapy   Adjuvant radiation therapy   01/03/2014 Procedure   Genetic testing negative for breast and ovarian cancer panels   01/24/2014 - 10/16/2014 Anti-estrogen oral therapy   Tamoxifen 20 mg daily 5 years   09/13/2014 Relapse/Recurrence   MRI of the breast revealed a 3.5 cm area of non-mass enhancement left breast lateral 3.5 x 1.9 x 1.6 cm, oval circumscribed T2 bright enhancing mass right breast upper outer quadrant 1 x 0.7 cm, linear non-mass enhancement 11 x 6 x 3 mm   10/07/2014 Surgery   Left breast lumpectomy: DCIS grade 10.6 cm ER 100%, PR 0%, multiple skin tags benign fibroepithelial polyps, reexcision 10/25/2013 benign fibrocystic changes with calcifications   12/27/2014 - 2021 Anti-estrogen oral therapy   Anastrozole 1 mg daily changed to letrozole 2.5 mg daily 01/05/2017due to musculoskeletal aches and pains     CHIEF COMPLIANT: Follow-up of recurrent left breast cancer    INTERVAL HISTORY: Katie Becker is a 71 y.o. with above-mentioned history of recurrent left breast cancer treated with lumpectomy, radiation.  She presents to the clinic today for a follow-up. She states that her blood pressure was different in both arms. She complains of chronic pain for 4 days under right arm. She has cartilages in left breast. She also states that she doesn't walk much. Overall she doesn't have any issues.    ALLERGIES:  is allergic to penicillins,  erythromycin, adhesive [tape], amoxicillin, shellfish allergy, and sulfamethoxazole.  MEDICATIONS:  Current Outpatient Medications  Medication Sig Dispense Refill   atorvastatin (LIPITOR) 40 MG tablet Take 1 tablet (40 mg total) by mouth daily. 90 tablet 3   Calcium Carbonate-Vitamin D (CALCIUM + D PO) Take 1 tablet by mouth 2 (two) times daily before a meal.      cetirizine (ZYRTEC) 10 MG tablet Take 10 mg by mouth daily as needed for allergies.     EPINEPHrine 0.3 mg/0.3 mL IJ SOAJ injection Inject 0.3 mg into the muscle as needed for anaphylaxis. 1 each 1   esomeprazole (NEXIUM) 40 MG capsule Take 1 capsule (40 mg total) by mouth daily. 90 capsule 3   Multiple Vitamins-Minerals (OCUVITE ADULT 50+ PO) Take by mouth.     Multiple Vitamins-Minerals (WOMENS MULTIVITAMIN PLUS PO) Take 1 tablet by mouth daily.      traMADol (ULTRAM) 50 MG tablet Take 1 tablet (50 mg total) by mouth daily as needed. 30 tablet 0   No current facility-administered medications for this visit.    PHYSICAL EXAMINATION: ECOG PERFORMANCE STATUS: 1 - Symptomatic but completely ambulatory  Vitals:   05/28/22 0947 05/28/22 0948  BP: (!) 158/77 (!) 149/76  Pulse: 72   Resp: 17   Temp: 98.1 F (36.7 C)   SpO2: 98%    Filed Weights   05/28/22 0947  Weight: 168 lb 11.2 oz (76.5 kg)      LABORATORY DATA:  I have reviewed the data as listed    Latest Ref Rng & Units 04/29/2022  10:49 AM 03/13/2021    8:23 PM 05/08/2020    4:18 PM  CMP  Glucose 70 - 99 mg/dL 130  111  101   BUN 8 - 23 mg/dL '12  13  13   '$ Creatinine 0.44 - 1.00 mg/dL 0.95  0.96  0.89   Sodium 135 - 145 mmol/L 141  140  140   Potassium 3.5 - 5.1 mmol/L 3.9  3.8  4.6   Chloride 98 - 111 mmol/L 108  105  100   CO2 22 - 32 mmol/L 28  26  33   Calcium 8.9 - 10.3 mg/dL 9.3  9.3  9.9   Total Protein 6.5 - 8.1 g/dL 6.9  6.9    Total Bilirubin 0.3 - 1.2 mg/dL 0.6  1.2    Alkaline Phos 38 - 126 U/L 91  108    AST 15 - 41 U/L 22  30    ALT 0 - 44  U/L 22  37      Lab Results  Component Value Date   WBC 5.9 04/29/2022   HGB 14.1 04/29/2022   HCT 41.6 04/29/2022   MCV 84.6 04/29/2022   PLT 195 04/29/2022   NEUTROABS 3.5 04/29/2022    ASSESSMENT & PLAN:  Breast cancer of lower-outer quadrant of left female breast ([place]) 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 Breast exam 05/28/2022: Benign 2. Mammograms and ultrasound 10/11/2020, benign, breast density category C Mammogram left breast: 05/02/2022: Performed because of left breast pain: Benign 3. Bone density December 2019 T score -0.8: Normal bone density. 4.  Breast MRI 07/10/20 Benign findings     Chronic osteoarthritis: Weight gain: She is slowly working on losing the weight.  Return to clinic on an as-needed basis      No orders of the defined types were placed in this encounter.  The patient has a good understanding of the overall plan. she agrees with it. she will call with any problems that may develop before the next visit here. Total time spent: 30 mins including face to face time and time spent for planning, charting and co-ordination of care   Harriette Ohara, MD 05/28/22    I Gardiner Coins am scribing for Dr. Lindi Adie  I have reviewed the above documentation for accuracy and completeness, and I agree with the above.

## 2022-05-23 DIAGNOSIS — M25551 Pain in right hip: Secondary | ICD-10-CM | POA: Diagnosis not present

## 2022-05-28 ENCOUNTER — Other Ambulatory Visit: Payer: Self-pay

## 2022-05-28 ENCOUNTER — Inpatient Hospital Stay: Payer: Medicare HMO | Attending: Physician Assistant | Admitting: Hematology and Oncology

## 2022-05-28 DIAGNOSIS — Z79811 Long term (current) use of aromatase inhibitors: Secondary | ICD-10-CM | POA: Insufficient documentation

## 2022-05-28 DIAGNOSIS — Z170 Estrogen receptor positive status [ER+]: Secondary | ICD-10-CM | POA: Insufficient documentation

## 2022-05-28 DIAGNOSIS — C50512 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: Secondary | ICD-10-CM | POA: Insufficient documentation

## 2022-05-28 DIAGNOSIS — Z79899 Other long term (current) drug therapy: Secondary | ICD-10-CM | POA: Diagnosis not present

## 2022-05-28 NOTE — Assessment & Plan Note (Addendum)
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 Breast exam8/05/2022: Benign 2. Mammogramsand ultrasound12/22/2021, benign, breast density category C Mammogram left breast: 05/02/2022: Performed because of left breast pain: Benign 3. Bone densityDecember 2019 T score -0.8: Normal bone density. 4.Breast MRI9/20/21 Benign findings    Chronic osteoarthritis: Weight gain: She is slowly working on losing the weight.  Return to clinic in 1 year for follow-up

## 2022-06-28 ENCOUNTER — Encounter: Payer: Self-pay | Admitting: Internal Medicine

## 2022-06-28 ENCOUNTER — Ambulatory Visit (INDEPENDENT_AMBULATORY_CARE_PROVIDER_SITE_OTHER): Payer: Medicare HMO | Admitting: Internal Medicine

## 2022-06-28 DIAGNOSIS — J069 Acute upper respiratory infection, unspecified: Secondary | ICD-10-CM | POA: Insufficient documentation

## 2022-06-28 NOTE — Assessment & Plan Note (Signed)
Seems like typical viral URI Discussed should be self limited Mild TM redness---if worsens next week would try antibiotic Otherwise, just supportive care

## 2022-06-28 NOTE — Progress Notes (Signed)
Subjective:    Patient ID: Katie Becker, female    DOB: 02-Jan-1951, 71 y.o.   MRN: [id number]  HPI Here due to respiratory infection  Started ~5 days ago Had swelling in adenoids and sore throat Tried coricidin and mucinex Trouble swallowing---tried hot tea, etc  Tylenol some help  Seemed better a couple of days ago Noted some white streaks so set up appt for strep check Dysphagia is better  Then seemed to move into her bronchial tubes Nasal congestion also  COVID test negative 2 days  Current Outpatient Medications on File Prior to Visit  Medication Sig Dispense Refill   atorvastatin (LIPITOR) 40 MG tablet Take 1 tablet (40 mg total) by mouth daily. 90 tablet 3   Calcium Carbonate-Vitamin D (CALCIUM + D PO) Take 1 tablet by mouth 2 (two) times daily before a meal.      cetirizine (ZYRTEC) 10 MG tablet Take 10 mg by mouth daily as needed for allergies.     EPINEPHrine 0.3 mg/0.3 mL IJ SOAJ injection Inject 0.3 mg into the muscle as needed for anaphylaxis. 1 each 1   esomeprazole (NEXIUM) 40 MG capsule Take 1 capsule (40 mg total) by mouth daily. 90 capsule 3   Multiple Vitamins-Minerals (OCUVITE ADULT 50+ PO) Take by mouth.     Multiple Vitamins-Minerals (WOMENS MULTIVITAMIN PLUS PO) Take 1 tablet by mouth daily.      traMADol (ULTRAM) 50 MG tablet Take 1 tablet (50 mg total) by mouth daily as needed. 30 tablet 0   No current facility-administered medications on file prior to visit.    Allergies  Allergen Reactions   Penicillins Anaphylaxis   Erythromycin Nausea And Vomiting   Adhesive [Tape]     rash   Amoxicillin     REACTION: unspecified   Shellfish Allergy     welts   Sulfamethoxazole Hives    REACTION: unspecified    Past Medical History:  Diagnosis Date   Arthritis    back   Breast cancer ([place])    Left Breast - ductal Carcinoma In-Situ - diagnosed on MRI- biopsy   DIVERTICULOSIS, COLON 04/20/2007   GERD 04/20/2007   H/O hiatal hernia    Heart murmur     told by PCP- years ago-never had echo   HEPATOMEGALY, HX OF 12/21/2009   HYPERLIPIDEMIA 04/20/2007   HYPERTENSION 04/20/2007   LOW BACK PAIN 08/16/2008   MENOPAUSAL SYNDROME 04/20/2007   PEPTIC ULCER DISEASE 09/06/2010   Personal history of chemotherapy    2015   Personal history of radiation therapy    2015   Wears glasses     Past Surgical History:  Procedure Laterality Date   ABDOMINAL HYSTERECTOMY     BREAST LUMPECTOMY Left    10/07/13 & 10/25/13   BREAST LUMPECTOMY WITH NEEDLE LOCALIZATION Left 10/07/2013   Procedure: BREAST LUMPECTOMY WITH NEEDLE LOCALIZATION EXCISION OF BILATERAL AXILLARY SKIN TAGS ;  Surgeon: Edward Jolly, MD;  Location: [place];  Service: General;  Laterality: Left;   BREAST SURGERY     30 rounds radiation   CARPAL TUNNEL RELEASE Bilateral    CHOLECYSTECTOMY     COLONOSCOPY     RE-EXCISION OF BREAST LUMPECTOMY Left 10/25/2013   Procedure: RE-EXCISION OF BREAST LUMPECTOMY;  Surgeon: Edward Jolly, MD;  Location: [place];  Service: General;  Laterality: Left;  clean wound class   TUBAL LIGATION      Family History  Problem Relation Age of Onset   Hypertension  Mother    Hyperlipidemia Mother    Heart disease Mother        congestive heart failure   Congestive Heart Failure Mother    Cancer Father        hx of melanoma   Hypertension Sister    Skin cancer Sister 70       basal cell carcinoma   Hypertension Brother    Heart disease Maternal Grandmother 73   Breast cancer Maternal Aunt 60   Colon cancer Paternal Grandmother 91   Heart disease Maternal Aunt 42   Prostate cancer Cousin     Social History   Socioeconomic History   Marital status: Widowed    Spouse name: Not on file   Number of children: 2   Years of education: Not on file   Highest education level: Not on file  Occupational History    Employer: [employer]    Comment: Retired  Tobacco Use   Smoking status: Former    Types:  Cigarettes    Quit date: 10/21/1978    Years since quitting: 43.7    Passive exposure: Never   Smokeless tobacco: Never  Substance and Sexual Activity   Alcohol use: No   Drug use: No   Sexual activity: Not Currently  Other Topics Concern   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   2 daughters      No living will   Daughters should be medical decision makers   Would accept resuscitation   Wouldn't want tube feeds if cognitively unaware   Social Determinants of Health   Financial Resource Strain: Not on file  Food Insecurity: Not on file  Transportation Needs: Not on file  Physical Activity: Not on file  Stress: Not on file  Social Connections: Not on file  Intimate Partner Violence: Not on file   Review of Systems No fever Slight chills No N/V Limited eating--but is able Was exposed to nephew with illness (but he is 50) Did see Dr Delilah Shan about her right hip pain--found small crack in femur. Will eventually need THR    Objective:   Physical Exam Constitutional:      Appearance: Normal appearance.  HENT:     Right Ear: Tympanic membrane and ear canal normal.     Left Ear: Ear canal normal.     Ears:     Comments: Mild redness in superior portion of left TM    Mouth/Throat:     Pharynx: No oropharyngeal exudate or posterior oropharyngeal erythema.     Comments: Negative exam Pulmonary:     Effort: Pulmonary effort is normal.     Breath sounds: Normal breath sounds. No wheezing or rales.  Musculoskeletal:     Cervical back: Neck supple.  Lymphadenopathy:     Cervical: No cervical adenopathy.  Neurological:     Mental Status: She is alert.            Assessment & Plan:

## 2022-07-23 ENCOUNTER — Encounter: Payer: Self-pay | Admitting: Internal Medicine

## 2022-07-23 ENCOUNTER — Ambulatory Visit (INDEPENDENT_AMBULATORY_CARE_PROVIDER_SITE_OTHER): Payer: Medicare HMO | Admitting: Internal Medicine

## 2022-07-23 VITALS — BP 120/76 | HR 80 | Temp 97.9°F | Ht 61.5 in | Wt 168.0 lb

## 2022-07-23 DIAGNOSIS — M25551 Pain in right hip: Secondary | ICD-10-CM | POA: Diagnosis not present

## 2022-07-23 DIAGNOSIS — Z0000 Encounter for general adult medical examination without abnormal findings: Secondary | ICD-10-CM | POA: Diagnosis not present

## 2022-07-23 DIAGNOSIS — I10 Essential (primary) hypertension: Secondary | ICD-10-CM | POA: Diagnosis not present

## 2022-07-23 DIAGNOSIS — E785 Hyperlipidemia, unspecified: Secondary | ICD-10-CM | POA: Diagnosis not present

## 2022-07-23 DIAGNOSIS — K2100 Gastro-esophageal reflux disease with esophagitis, without bleeding: Secondary | ICD-10-CM

## 2022-07-23 DIAGNOSIS — Z853 Personal history of malignant neoplasm of breast: Secondary | ICD-10-CM | POA: Diagnosis not present

## 2022-07-23 NOTE — Assessment & Plan Note (Signed)
I have personally reviewed the Medicare Annual Wellness questionnaire and have noted 1. The patient's medical and social history 2. Their use of alcohol, tobacco or illicit drugs 3. Their current medications and supplements 4. The patient's functional ability including ADL's, fall risks, home safety risks and hearing or visual             impairment. 5. Diet and physical activities 6. Evidence for depression or mood disorders  The patients weight, height, BMI and visual acuity have been recorded in the chart I have made referrals, counseling and provided education to the patient based review of the above and I have provided the pt with a written personalized care plan for preventive services.  I have provided you with a copy of your personalized plan for preventive services. Please take the time to review along with your updated medication list.  Last screening colon in 2026 Flu vaccine after upcoming vacation Likely no COVID--due to bad local reaction with the last one No pap due to age Does walk some--discussed

## 2022-07-23 NOTE — Progress Notes (Signed)
Hearing Screening - Comments:: Passed whisper test Vision Screening - Comments:: November 2022

## 2022-07-23 NOTE — Assessment & Plan Note (Signed)
Told from fracture and the THR needed Discussed using tylenol more regularly for now

## 2022-07-23 NOTE — Assessment & Plan Note (Signed)
BP Readings from Last 3 Encounters:  07/23/22 120/76  06/28/22 138/84  05/28/22 (!) 149/76   Good control without meds

## 2022-07-23 NOTE — Progress Notes (Signed)
Subjective:    Patient ID: Katie Becker, female    DOB: 1950/12/24, 71 y.o.   MRN: [id number]  HPI Here for Medicare wellness visit and follow up of chronic health conditions Reviewed advanced directives Reviewed other doctors---Dr Terance Hart, Dr Estill Bakes, Dr Marta Lamas, Dr Winnifred Friar No hospitalizations or surgery this year Vision is okay--changing from Dr Kenton Kingfisher to [facility] Hearing is okay No alcohol or tobacco Not exercising other than walking--1-1.5 miles twice a day 1 fall---no injury No depression or anhedonia Independent with instrumental ADLs Mild memory issues--recall  Got chiggers--was out in the woods Now controlled---zyrtec and topical (coconut oil, etc)  Feels something in left ear--like something rubbing Then it popped yesterday No pain---hearing is fine  Still on statin--primary prevention  Continues on nexium Tried weaning--but "belly on fire" on days off Back to daily No dysphagia---still gets "pill sensation"  No chest pain or SOB No dizziness or syncope No edema no palpitations  Right hip still bad---getting close to considering THR No meds  Current Outpatient Medications on File Prior to Visit  Medication Sig Dispense Refill   atorvastatin (LIPITOR) 40 MG tablet Take 1 tablet (40 mg total) by mouth daily. 90 tablet 3   Calcium Carbonate-Vitamin D (CALCIUM + D PO) Take 1 tablet by mouth 2 (two) times daily before a meal.      cetirizine (ZYRTEC) 10 MG tablet Take 10 mg by mouth daily as needed for allergies.     EPINEPHrine 0.3 mg/0.3 mL IJ SOAJ injection Inject 0.3 mg into the muscle as needed for anaphylaxis. 1 each 1   esomeprazole (NEXIUM) 40 MG capsule Take 1 capsule (40 mg total) by mouth daily. 90 capsule 3   Multiple Vitamins-Minerals (OCUVITE ADULT 50+ PO) Take by mouth.     Multiple Vitamins-Minerals (WOMENS MULTIVITAMIN PLUS PO) Take 1 tablet by mouth daily.      traMADol (ULTRAM) 50 MG tablet Take 1 tablet  (50 mg total) by mouth daily as needed. 30 tablet 0   No current facility-administered medications on file prior to visit.    Allergies  Allergen Reactions   Penicillins Anaphylaxis   Covid-19 (Mrna Bivalent) Vaccine Therapist, music) [Covid-19 (Mrna) Vaccine] Other (See Comments)    Local swelling and redness.   Erythromycin Nausea And Vomiting   Adhesive [Tape]     rash   Amoxicillin     REACTION: unspecified   Shellfish Allergy     welts   Sulfamethoxazole Hives    REACTION: unspecified    Past Medical History:  Diagnosis Date   Arthritis    back   Breast cancer ([place])    Left Breast - ductal Carcinoma In-Situ - diagnosed on MRI- biopsy   DIVERTICULOSIS, COLON 04/20/2007   GERD 04/20/2007   H/O hiatal hernia    Heart murmur    told by PCP- years ago-never had echo   HEPATOMEGALY, HX OF 12/21/2009   HYPERLIPIDEMIA 04/20/2007   HYPERTENSION 04/20/2007   LOW BACK PAIN 08/16/2008   MENOPAUSAL SYNDROME 04/20/2007   PEPTIC ULCER DISEASE 09/06/2010   Personal history of chemotherapy    2015   Personal history of radiation therapy    2015   Wears glasses     Past Surgical History:  Procedure Laterality Date   ABDOMINAL HYSTERECTOMY     BREAST LUMPECTOMY Left    10/07/13 & 10/25/13   BREAST LUMPECTOMY WITH NEEDLE LOCALIZATION Left 10/07/2013   Procedure: BREAST LUMPECTOMY WITH NEEDLE LOCALIZATION EXCISION OF BILATERAL AXILLARY SKIN TAGS ;  Surgeon: Edward Jolly, MD;  Location: [place];  Service: General;  Laterality: Left;   BREAST SURGERY     30 rounds radiation   CARPAL TUNNEL RELEASE Bilateral    CHOLECYSTECTOMY     COLONOSCOPY     RE-EXCISION OF BREAST LUMPECTOMY Left 10/25/2013   Procedure: RE-EXCISION OF BREAST LUMPECTOMY;  Surgeon: Edward Jolly, MD;  Location: [place];  Service: General;  Laterality: Left;  clean wound class   TUBAL LIGATION      Family History  Problem Relation Age of Onset   Hypertension Mother    Hyperlipidemia Mother     Heart disease Mother        congestive heart failure   Congestive Heart Failure Mother    Cancer Father        hx of melanoma   Hypertension Sister    Skin cancer Sister 42       basal cell carcinoma   Hypertension Brother    Heart disease Maternal Grandmother 36   Breast cancer Maternal Aunt 60   Colon cancer Paternal Grandmother 91   Heart disease Maternal Aunt 42   Prostate cancer Cousin     Social History   Socioeconomic History   Marital status: Widowed    Spouse name: Not on file   Number of children: 2   Years of education: Not on file   Highest education level: Not on file  Occupational History    Employer: [employer]    Comment: Retired  Tobacco Use   Smoking status: Former    Types: Cigarettes    Quit date: 10/21/1978    Years since quitting: 43.7    Passive exposure: Never   Smokeless tobacco: Never  Substance and Sexual Activity   Alcohol use: No   Drug use: No   Sexual activity: Not Currently  Other Topics Concern   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   2 daughters      No living will   Daughters should be medical decision makers   Would accept resuscitation   Wouldn't want tube feeds if cognitively unaware   Social Determinants of Health   Financial Resource Strain: Not on file  Food Insecurity: Not on file  Transportation Needs: Not on file  Physical Activity: Not on file  Stress: Not on file  Social Connections: Not on file  Intimate Partner Violence: Not on file   Review of Systems Appetite is fine Weight down a few pounds this year Not sleeping great---nothing new Wears seat belt Teeth okay--keeps up with dentist No new suspicious skin lesions---will need closer dermatology Bowels move fine--no blood Voids okay---no pain or incontinence Minimal gout --tingling and then just increases water intake Some neck arthritis problems---tylenol if it gets bad    Objective:   Physical  Exam Constitutional:      Appearance: Normal appearance.  HENT:     Right Ear: Tympanic membrane and ear canal normal.     Left Ear: Tympanic membrane and ear canal normal.     Mouth/Throat:     Comments: No lesions Eyes:     Conjunctiva/sclera: Conjunctivae normal.     Pupils: Pupils are equal, round, and reactive to light.  Cardiovascular:     Rate and Rhythm: Normal rate and regular rhythm.     Pulses: Normal pulses.     Heart sounds: No murmur heard.    No gallop.  Pulmonary:     Effort: Pulmonary effort is normal.     Breath sounds: Normal breath sounds. No wheezing or rales.  Abdominal:     Palpations: Abdomen is soft.     Tenderness: There is no abdominal tenderness.  Musculoskeletal:     Cervical back: Neck supple.     Right lower leg: No edema.     Left lower leg: No edema.     Comments: ROM right hip is fairly normal  Lymphadenopathy:     Cervical: No cervical adenopathy.  Skin:    Findings: No lesion or rash.  Neurological:     General: No focal deficit present.     Mental Status: She is alert and oriented to person, place, and time.     Comments: Mini-cog normal  Psychiatric:        Mood and Affect: Mood normal.        Behavior: Behavior normal.            Assessment & Plan:

## 2022-07-23 NOTE — Assessment & Plan Note (Signed)
Didn't tolerate wean Continue nexium '40mg'$  daily

## 2022-07-23 NOTE — Assessment & Plan Note (Signed)
Done with Rx Yearly mammograms

## 2022-07-23 NOTE — Assessment & Plan Note (Signed)
Doing well on primary prevention with atorvastatin 40

## 2022-08-02 ENCOUNTER — Ambulatory Visit (INDEPENDENT_AMBULATORY_CARE_PROVIDER_SITE_OTHER): Payer: Medicare HMO

## 2022-08-02 DIAGNOSIS — Z23 Encounter for immunization: Secondary | ICD-10-CM | POA: Diagnosis not present

## 2022-08-21 DIAGNOSIS — H2513 Age-related nuclear cataract, bilateral: Secondary | ICD-10-CM | POA: Diagnosis not present

## 2022-08-21 DIAGNOSIS — Z0100 Encounter for examination of eyes and vision without abnormal findings: Secondary | ICD-10-CM | POA: Diagnosis not present

## 2022-08-26 ENCOUNTER — Telehealth: Payer: Self-pay | Admitting: Internal Medicine

## 2022-08-26 NOTE — Telephone Encounter (Signed)
Spoke to [place].

## 2022-08-26 NOTE — Telephone Encounter (Signed)
Katie Becker with [employer] called and stated if she still needs to premed for dental appointments. Call back number [phone].

## 2022-08-28 ENCOUNTER — Other Ambulatory Visit: Payer: Self-pay | Admitting: Internal Medicine

## 2022-08-28 DIAGNOSIS — Z1231 Encounter for screening mammogram for malignant neoplasm of breast: Secondary | ICD-10-CM

## 2022-09-04 DIAGNOSIS — M25551 Pain in right hip: Secondary | ICD-10-CM | POA: Diagnosis not present

## 2022-09-09 ENCOUNTER — Other Ambulatory Visit: Payer: Self-pay | Admitting: Internal Medicine

## 2022-10-30 ENCOUNTER — Encounter: Payer: Self-pay | Admitting: Internal Medicine

## 2022-10-30 ENCOUNTER — Ambulatory Visit (INDEPENDENT_AMBULATORY_CARE_PROVIDER_SITE_OTHER): Payer: Medicare HMO | Admitting: Internal Medicine

## 2022-10-30 VITALS — BP 138/80 | HR 86 | Temp 97.8°F | Ht 61.5 in | Wt 170.0 lb

## 2022-10-30 DIAGNOSIS — H60501 Unspecified acute noninfective otitis externa, right ear: Secondary | ICD-10-CM | POA: Diagnosis not present

## 2022-10-30 DIAGNOSIS — H6090 Unspecified otitis externa, unspecified ear: Secondary | ICD-10-CM | POA: Insufficient documentation

## 2022-10-30 MED ORDER — NEOMYCIN-POLYMYXIN-HC 3.5-10000-1 OT SUSP: 4.0000 [drp] | Freq: Four times a day (QID) | OTIC | 0 refills | Status: DC

## 2022-10-30 NOTE — Assessment & Plan Note (Signed)
Mild Her symptoms are intermittent Will try cortisporin to see if it can clear it up

## 2022-10-30 NOTE — Progress Notes (Signed)
Subjective:    Patient ID: Katie Becker, female    DOB: 10-Sep-1951, 72 y.o.   MRN: [id number]  HPI Here due to right ear pain  Had tooth cap come off and put back on (same one put back on) (3-4 months ago) Was x-rayed and no root problems  Now has feeling in right ear Seems better if she covers it up Feels it behind her ear also No ear drainage from ear No exposures to water---just showers  Right hip is improving Did go back to the orthopedist Fracture is healing and symptoms improved  Current Outpatient Medications on File Prior to Visit  Medication Sig Dispense Refill   atorvastatin (LIPITOR) 40 MG tablet TAKE 1 TABLET DAILY 90 tablet 3   Calcium Carbonate-Vitamin D (CALCIUM + D PO) Take 1 tablet by mouth 2 (two) times daily before a meal.      cetirizine (ZYRTEC) 10 MG tablet Take 10 mg by mouth daily as needed for allergies.     EPINEPHrine 0.3 mg/0.3 mL IJ SOAJ injection Inject 0.3 mg into the muscle as needed for anaphylaxis. 1 each 1   esomeprazole (NEXIUM) 40 MG capsule Take 1 capsule (40 mg total) by mouth daily. 90 capsule 3   Multiple Vitamins-Minerals (OCUVITE ADULT 50+ PO) Take by mouth.     Multiple Vitamins-Minerals (WOMENS MULTIVITAMIN PLUS PO) Take 1 tablet by mouth daily.      traMADol (ULTRAM) 50 MG tablet Take 1 tablet (50 mg total) by mouth daily as needed. 30 tablet 0   No current facility-administered medications on file prior to visit.    Allergies  Allergen Reactions   Penicillins Anaphylaxis   Covid-19 (Mrna Bivalent) Vaccine Therapist, music) [Covid-19 (Mrna) Vaccine] Other (See Comments)    Local swelling and redness.   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   DIVERTICULOSIS, COLON 04/20/2007   GERD 04/20/2007   H/O hiatal hernia    Heart murmur    told by PCP- years ago-never had echo   HEPATOMEGALY, HX OF 12/21/2009   HYPERLIPIDEMIA 04/20/2007   HYPERTENSION 04/20/2007   LOW BACK PAIN 08/16/2008   MENOPAUSAL SYNDROME 04/20/2007   PEPTIC ULCER DISEASE 09/06/2010   Personal history of chemotherapy    2015   Personal history of radiation therapy    2015   Wears glasses     Past Surgical History:  Procedure Laterality Date   ABDOMINAL HYSTERECTOMY     BREAST LUMPECTOMY Left    10/07/13 & 10/25/13   BREAST LUMPECTOMY WITH NEEDLE LOCALIZATION Left 10/07/2013   Procedure: BREAST LUMPECTOMY WITH NEEDLE LOCALIZATION EXCISION OF BILATERAL AXILLARY SKIN TAGS ;  Surgeon: Edward Jolly, MD;  Location: [facility];  Service: General;  Laterality: Left;   BREAST SURGERY     30 rounds radiation   CARPAL TUNNEL RELEASE Bilateral    CHOLECYSTECTOMY     COLONOSCOPY     RE-EXCISION OF BREAST LUMPECTOMY Left 10/25/2013   Procedure: RE-EXCISION OF BREAST LUMPECTOMY;  Surgeon: Edward Jolly, MD;  Location: [facility];  Service: General;  Laterality: Left;  clean  wound class   TUBAL LIGATION      Family History  Problem Relation Age of Onset   Hypertension Mother    Hyperlipidemia Mother    Heart disease Mother        congestive heart failure   Congestive Heart Failure Mother    Cancer Father        hx of melanoma   Hypertension Sister    Skin cancer Sister 75       basal cell carcinoma   Hypertension Brother    Heart disease Maternal Grandmother 48   Breast cancer Maternal Aunt 60   Colon cancer Paternal Grandmother 91   Heart disease Maternal Aunt 42   Prostate cancer Cousin     Social History   Socioeconomic History   Marital status: Widowed    Spouse name: Not on file   Number of children: 2   Years of education: Not on file   Highest education level: Not on file  Occupational History    Employer: [employer]    Comment: Retired  Tobacco Use   Smoking status: Former    Types: Cigarettes    Quit date: 10/21/1978    Years since quitting: 44.0    Passive exposure: Never   Smokeless tobacco: Never  Substance and Sexual Activity   Alcohol use: No   Drug use: No   Sexual activity: Not Currently  Other Topics Concern   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   2 daughters      No living will   Daughters should be medical decision makers   Would accept resuscitation   Wouldn't want tube feeds if cognitively unaware   Social Determinants of Health   Financial Resource Strain: Not on file  Food Insecurity: Not on file  Transportation Needs: Not on file  Physical Activity: Not on file  Stress: Not on file  Social Connections: Not on file  Intimate Partner Violence: Not on file   Review of Systems Still has lump in left arm from COVID injection (August) Had respiratory illnesses from October to December (grandkids sick also)     Objective:   Physical Exam Constitutional:      Appearance: Normal appearance.  HENT:     Head:     Comments: No TMJ tenderness    Left Ear: Tympanic membrane and ear canal normal.     Ears:     Comments: Right TM slightly dull but not inflamed Mild redness/inflammation in canal    Mouth/Throat:     Pharynx: No oropharyngeal exudate or posterior oropharyngeal erythema.     Comments: No issues with teeth apparent Neurological:     Mental Status: She is alert.            Assessment & Plan:

## 2022-10-31 ENCOUNTER — Ambulatory Visit
Admission: RE | Admit: 2022-10-31 | Discharge: 2022-10-31 | Disposition: A | Discharge status: Home / Self Care | Payer: Medicare HMO | Source: Ambulatory Visit | Attending: Internal Medicine | Admitting: Internal Medicine

## 2022-10-31 DIAGNOSIS — Z1231 Encounter for screening mammogram for malignant neoplasm of breast: Secondary | ICD-10-CM | POA: Diagnosis not present

## 2022-11-06 ENCOUNTER — Encounter: Payer: Self-pay | Admitting: Internal Medicine

## 2022-11-06 MED ORDER — ESOMEPRAZOLE MAGNESIUM 40 MG PO CPDR: 40.0000 mg | DELAYED_RELEASE_CAPSULE | Freq: Every day | ORAL | 3 refills | Status: DC

## 2022-11-18 ENCOUNTER — Encounter: Payer: Self-pay | Admitting: Internal Medicine

## 2022-11-19 ENCOUNTER — Encounter: Payer: Self-pay | Admitting: Internal Medicine

## 2022-11-19 ENCOUNTER — Ambulatory Visit (INDEPENDENT_AMBULATORY_CARE_PROVIDER_SITE_OTHER): Payer: Medicare HMO | Admitting: Internal Medicine

## 2022-11-19 VITALS — BP 138/76 | HR 70 | Temp 97.8°F | Ht 61.5 in | Wt 170.0 lb

## 2022-11-19 DIAGNOSIS — H6991 Unspecified Eustachian tube disorder, right ear: Secondary | ICD-10-CM

## 2022-11-19 MED ORDER — PREDNISONE 20 MG PO TABS: 40.0000 mg | ORAL_TABLET | Freq: Every day | ORAL | 0 refills | Status: DC

## 2022-11-19 NOTE — Assessment & Plan Note (Signed)
No otitis externa No recent travel Just annoying sensation and she fears losing hearing Will try prednisone 40 x 3, 20 x 3 If persists, will refer to ENT--Dr [facility] again

## 2022-11-19 NOTE — Progress Notes (Signed)
Subjective:    Patient ID: Katie Becker, female    DOB: 01-13-1951, 72 y.o.   MRN: [id number]  HPI Here due to ongoing ear symptoms  Still has a feeling "of pressure"--"pushing in there" Did try some sinus medications --not sure if this is the issue The drops did stop the achy pain she had behind her ear  No change in hearing Slight tinnitus at times--not really new No fever, cough, nasal drainage Some nose congestion from dry heat--uses nasal saline  Current Outpatient Medications on File Prior to Visit  Medication Sig Dispense Refill   atorvastatin (LIPITOR) 40 MG tablet TAKE 1 TABLET DAILY 90 tablet 3   Calcium Carbonate-Vitamin D (CALCIUM + D PO) Take 1 tablet by mouth 2 (two) times daily before a meal.      cetirizine (ZYRTEC) 10 MG tablet Take 10 mg by mouth daily as needed for allergies.     EPINEPHrine 0.3 mg/0.3 mL IJ SOAJ injection Inject 0.3 mg into the muscle as needed for anaphylaxis. 1 each 1   esomeprazole (NEXIUM) 40 MG capsule Take 1 capsule (40 mg total) by mouth daily. 90 capsule 3   Multiple Vitamins-Minerals (OCUVITE ADULT 50+ PO) Take by mouth.     Multiple Vitamins-Minerals (WOMENS MULTIVITAMIN PLUS PO) Take 1 tablet by mouth daily.      neomycin-polymyxin-hydrocortisone (CORTISPORIN) 3.5-10000-1 OTIC suspension Place 4 drops into the right ear 4 (four) times daily. 10 mL 0   traMADol (ULTRAM) 50 MG tablet Take 1 tablet (50 mg total) by mouth daily as needed. 30 tablet 0   No current facility-administered medications on file prior to visit.    Allergies  Allergen Reactions   Penicillins Anaphylaxis   Covid-19 (Mrna Bivalent) Vaccine Therapist, music) [Covid-19 (Mrna) Vaccine] Other (See Comments)    Local swelling and redness.   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 - diagnosed on MRI- biopsy   DIVERTICULOSIS, COLON 04/20/2007   GERD 04/20/2007   H/O hiatal hernia    Heart murmur    told by PCP- years ago-never had echo   HEPATOMEGALY, HX OF 12/21/2009   HYPERLIPIDEMIA 04/20/2007   HYPERTENSION 04/20/2007   LOW BACK PAIN 08/16/2008   MENOPAUSAL SYNDROME 04/20/2007   PEPTIC ULCER DISEASE 09/06/2010   Personal history of chemotherapy    2015   Personal history of radiation therapy    2015   Wears glasses     Past Surgical History:  Procedure Laterality Date   ABDOMINAL HYSTERECTOMY     BREAST LUMPECTOMY Left    10/07/13 & 10/25/13   BREAST LUMPECTOMY WITH NEEDLE LOCALIZATION Left 10/07/2013   Procedure: BREAST LUMPECTOMY WITH NEEDLE LOCALIZATION EXCISION OF BILATERAL AXILLARY SKIN TAGS ;  Surgeon: Edward Jolly, MD;  Location: [facility];  Service: General;  Laterality: Left;   BREAST SURGERY     30 rounds radiation   CARPAL TUNNEL RELEASE Bilateral    CHOLECYSTECTOMY     COLONOSCOPY     RE-EXCISION OF BREAST LUMPECTOMY Left 10/25/2013   Procedure: RE-EXCISION OF BREAST LUMPECTOMY;  Surgeon: Edward Jolly, MD;  Location: [facility];  Service: General;  Laterality: Left;  clean wound class   TUBAL LIGATION      Family History  Problem Relation Age of Onset   Hypertension Mother    Hyperlipidemia Mother    Heart disease Mother        congestive heart failure   Congestive Heart Failure Mother    Cancer Father        hx of melanoma   Hypertension Sister    Skin cancer Sister 6       basal cell carcinoma   Hypertension Brother    Heart disease Maternal Grandmother 15   Breast cancer Maternal Aunt 60   Colon cancer Paternal Grandmother 91   Heart disease Maternal Aunt 42   Prostate cancer Cousin     Social History   Socioeconomic History   Marital status: Widowed    Spouse name: Not on file   Number of children: 2   Years of  education: Not on file   Highest education level: Not on file  Occupational History    Employer: [employer]    Comment: Retired  Tobacco Use   Smoking status: Former    Types: Cigarettes    Quit date: 10/21/1978    Years since quitting: 44.1    Passive exposure: Never   Smokeless tobacco: Never  Substance and Sexual Activity   Alcohol use: No   Drug use: No   Sexual activity: Not Currently  Other Topics Concern   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  HENT:     Left Ear: Tympanic membrane and ear canal normal.     Ears:     Comments: No tragal tenderness No canal abnormality TM is slightly retracted--but no inflammation Neurological:     Mental Status: She is alert.            Assessment & Plan:

## 2022-12-24 DIAGNOSIS — H2513 Age-related nuclear cataract, bilateral: Secondary | ICD-10-CM | POA: Diagnosis not present

## 2022-12-24 DIAGNOSIS — M47812 Spondylosis without myelopathy or radiculopathy, cervical region: Secondary | ICD-10-CM | POA: Diagnosis not present

## 2022-12-24 DIAGNOSIS — M542 Cervicalgia: Secondary | ICD-10-CM | POA: Diagnosis not present

## 2023-01-16 DIAGNOSIS — M5451 Vertebrogenic low back pain: Secondary | ICD-10-CM | POA: Diagnosis not present

## 2023-01-16 DIAGNOSIS — M542 Cervicalgia: Secondary | ICD-10-CM | POA: Diagnosis not present

## 2023-01-27 DIAGNOSIS — M25512 Pain in left shoulder: Secondary | ICD-10-CM | POA: Diagnosis not present

## 2023-01-27 DIAGNOSIS — M79602 Pain in left arm: Secondary | ICD-10-CM | POA: Diagnosis not present

## 2023-02-05 DIAGNOSIS — M79622 Pain in left upper arm: Secondary | ICD-10-CM | POA: Diagnosis not present

## 2023-02-06 DIAGNOSIS — M542 Cervicalgia: Secondary | ICD-10-CM | POA: Diagnosis not present

## 2023-02-06 DIAGNOSIS — M5451 Vertebrogenic low back pain: Secondary | ICD-10-CM | POA: Diagnosis not present

## 2023-02-11 ENCOUNTER — Encounter: Payer: Self-pay | Admitting: Internal Medicine

## 2023-02-12 DIAGNOSIS — M25512 Pain in left shoulder: Secondary | ICD-10-CM | POA: Diagnosis not present

## 2023-02-12 DIAGNOSIS — H6991 Unspecified Eustachian tube disorder, right ear: Secondary | ICD-10-CM | POA: Diagnosis not present

## 2023-02-12 DIAGNOSIS — R2232 Localized swelling, mass and lump, left upper limb: Secondary | ICD-10-CM | POA: Diagnosis not present

## 2023-02-17 ENCOUNTER — Ambulatory Visit (INDEPENDENT_AMBULATORY_CARE_PROVIDER_SITE_OTHER): Payer: Medicare HMO | Admitting: Internal Medicine

## 2023-02-17 VITALS — BP 132/74 | HR 82 | Temp 98.2°F | Ht 61.5 in | Wt 174.0 lb

## 2023-02-17 DIAGNOSIS — K2100 Gastro-esophageal reflux disease with esophagitis, without bleeding: Secondary | ICD-10-CM

## 2023-02-17 DIAGNOSIS — R29898 Other symptoms and signs involving the musculoskeletal system: Secondary | ICD-10-CM | POA: Diagnosis not present

## 2023-02-17 NOTE — Progress Notes (Signed)
Subjective:    Patient ID: Katie Becker, female    DOB: Jun 28, 1951, 72 y.o.   MRN: [id number]  HPI Here for me to check a couple of things  Had MRI by Dr Roselyn Bering check the left arm mass Is just a lipoma  Now feels mass  Below sternum--feels bony No movement  Not painful  Current Outpatient Medications on File Prior to Visit  Medication Sig Dispense Refill   atorvastatin (LIPITOR) 40 MG tablet TAKE 1 TABLET DAILY 90 tablet 3   Calcium Carbonate-Vitamin D (CALCIUM + D PO) Take 1 tablet by mouth 2 (two) times daily before a meal.      cetirizine (ZYRTEC) 10 MG tablet Take 10 mg by mouth daily as needed for allergies.     EPINEPHrine 0.3 mg/0.3 mL IJ SOAJ injection Inject 0.3 mg into the muscle as needed for anaphylaxis. 1 each 1   esomeprazole (NEXIUM) 40 MG capsule Take 1 capsule (40 mg total) by mouth daily. 90 capsule 3   fluticasone (FLONASE) 50 MCG/ACT nasal spray Place 2 sprays into both nostrils daily.     Multiple Vitamins-Minerals (OCUVITE ADULT 50+ PO) Take by mouth.     Multiple Vitamins-Minerals (WOMENS MULTIVITAMIN PLUS PO) Take 1 tablet by mouth daily.      neomycin-polymyxin-hydrocortisone (CORTISPORIN) 3.5-10000-1 OTIC suspension Place 4 drops into the right ear 4 (four) times daily. 10 mL 0   traMADol (ULTRAM) 50 MG tablet Take 1 tablet (50 mg total) by mouth daily as needed. 30 tablet 0   No current facility-administered medications on file prior to visit.    Allergies  Allergen Reactions   Penicillins Anaphylaxis   Covid-19 (Mrna Bivalent) Vaccine Proofreader) [Covid-19 (Mrna) Vaccine] Other (See Comments)    Local swelling and redness.   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 -  diagnosed on MRI- biopsy   DIVERTICULOSIS, COLON 04/20/2007   GERD 04/20/2007   H/O hiatal hernia    Heart murmur    told by PCP- years ago-never had echo   HEPATOMEGALY, HX OF 12/21/2009   HYPERLIPIDEMIA 04/20/2007   HYPERTENSION 04/20/2007   LOW BACK PAIN 08/16/2008   MENOPAUSAL SYNDROME 04/20/2007   PEPTIC ULCER DISEASE 09/06/2010   Personal history of chemotherapy    2015   Personal history of radiation therapy    2015   Wears glasses     Past Surgical History:  Procedure Laterality Date   ABDOMINAL HYSTERECTOMY     BREAST LUMPECTOMY Left    10/07/13 & 10/25/13   BREAST LUMPECTOMY WITH NEEDLE LOCALIZATION Left 10/07/2013   Procedure: BREAST LUMPECTOMY WITH NEEDLE LOCALIZATION EXCISION OF BILATERAL AXILLARY SKIN TAGS ;  Surgeon: Mariella Saa, MD;  Location[facility];  Service: General;  Laterality: Left;   BREAST SURGERY     30 rounds radiation   CARPAL TUNNEL RELEASE Bilateral    CHOLECYSTECTOMY     COLONOSCOPY     RE-EXCISION OF BREAST LUMPECTOMY Left 10/25/2013   Procedure: RE-EXCISION OF BREAST LUMPECTOMY;  Surgeon: Mariella Saa, MD;  [facility];  Service: General;  Laterality: Left;  clean wound class  TUBAL LIGATION      Family History  Problem Relation Age of Onset   Hypertension Mother    Hyperlipidemia Mother    Heart disease Mother        congestive heart failure   Congestive Heart Failure Mother    Cancer Father        hx of melanoma   Hypertension Sister    Skin cancer Sister 78       basal cell carcinoma   Hypertension Brother    Heart disease Maternal Grandmother 19   Breast cancer Maternal Aunt 54   Colon cancer Paternal Grandmother 71   Heart disease Maternal Aunt 42   Prostate cancer Cousin     Social History   Socioeconomic History   Marital status: Widowed    Spouse name: Not on file   Number of children: 2   Years of education: Not on file   Highest education level: Associate degree: occupational,  Scientist, product/process development, or vocational program  Occupational History    Employer: [employer]    Comment: Retired  Tobacco Use   Smoking status: Former    Types: Cigarettes    Quit date: 10/21/1978    Years since quitting: 44.3    Passive exposure: Never   Smokeless tobacco: Never  Substance and Sexual Activity   Alcohol use: No   Drug use: No   Sexual activity: Not Currently  Other Topics Concern   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   2 daughters      No living will   Daughters should be medical decision makers   Would accept resuscitation   Wouldn't want tube feeds if cognitively unaware   Social Determinants of Health   Financial Resource Strain: Low Risk  (02/17/2023)   Overall Financial Resource Strain (CARDIA)    Difficulty of Paying Living Expenses: Not hard at all  Food Insecurity: No Food Insecurity (02/17/2023)   Hunger Vital Sign    Worried About Running Out of Food in the Last Year: Never true    Ran Out of Food in the Last Year: Never true  Transportation Needs: No Transportation Needs (02/17/2023)   PRAPARE - Administrator, Civil Service (Medical): No    Lack of Transportation (Non-Medical): No  Physical Activity: Unknown (02/17/2023)   Exercise Vital Sign    Days of Exercise per Week: 0 days    Minutes of Exercise per Session: Not on file  Stress: No Stress Concern Present (02/17/2023)   [employer] of Occupational Health - Occupational Stress Questionnaire    Feeling of Stress : Only a little  Social Connections: Moderately Integrated (02/17/2023)   Social Connection and Isolation Panel [NHANES]    Frequency of Communication with Friends and Family: More than three times a week    Frequency of Social Gatherings with Friends and Family: More than three times a week    Attends Religious Services: More than 4 times per year    Active Member of [employer] or Organizations: Yes    Attends Tax inspector Meetings: More than 4 times per year    Marital Status: Widowed  Intimate Partner Violence: Not on file   Review of Systems Wonders about a repeat endoscopy--has more symptoms since a bad cough Ongoing sensation of something stuck in throat---direct laryngoscopy benign  Does get sense of something in her throat--especially if she leans over    Objective:  Physical Exam Constitutional:      Appearance: Normal appearance.  Pulmonary:     Comments: She is concerned about xiphoid ---no tenderness Neurological:     Mental Status: She is alert.            Assessment & Plan:

## 2023-02-17 NOTE — Assessment & Plan Note (Signed)
Ongoing symptoms--especially when leaning forward Is still on the nightly nexium Discussed that weight loss may improve Will contact Dr Leone Payor if symptoms worsen

## 2023-02-17 NOTE — Assessment & Plan Note (Signed)
Was concerned it was pushing against her esophagus---reassured her that no action is needed

## 2023-02-20 DIAGNOSIS — M542 Cervicalgia: Secondary | ICD-10-CM | POA: Diagnosis not present

## 2023-02-20 DIAGNOSIS — M5451 Vertebrogenic low back pain: Secondary | ICD-10-CM | POA: Diagnosis not present

## 2023-03-12 DIAGNOSIS — R0789 Other chest pain: Secondary | ICD-10-CM | POA: Diagnosis not present

## 2023-04-04 DIAGNOSIS — H938X1 Other specified disorders of right ear: Secondary | ICD-10-CM | POA: Diagnosis not present

## 2023-06-10 DIAGNOSIS — Z01419 Encounter for gynecological examination (general) (routine) without abnormal findings: Secondary | ICD-10-CM | POA: Diagnosis not present

## 2023-06-10 DIAGNOSIS — Z1272 Encounter for screening for malignant neoplasm of vagina: Secondary | ICD-10-CM | POA: Diagnosis not present

## 2023-06-25 ENCOUNTER — Encounter: Payer: Self-pay | Admitting: Internal Medicine

## 2023-06-25 ENCOUNTER — Ambulatory Visit (INDEPENDENT_AMBULATORY_CARE_PROVIDER_SITE_OTHER): Payer: Medicare HMO | Admitting: Internal Medicine

## 2023-06-25 VITALS — BP 140/78 | HR 72 | Temp 98.6°F | Ht 61.5 in | Wt 175.0 lb

## 2023-06-25 DIAGNOSIS — M79602 Pain in left arm: Secondary | ICD-10-CM

## 2023-06-25 NOTE — Assessment & Plan Note (Signed)
Doesn't seem orthopedic or tendonitis Negative condyle exam in elbow Seems most consistent with superficial phlebitis Reassured--nothing to suggest deep vein involvement  Recommended warm compresses and/diclofenac gel She isn't excited about further intervention or treatment (symptoms are mild)

## 2023-06-25 NOTE — Telephone Encounter (Signed)
I have updated chart with corrected weight.

## 2023-06-25 NOTE — Progress Notes (Signed)
Subjective:    Patient ID: Katie Becker, female    DOB: 05-25-1951, 72 y.o.   MRN: [id number]  HPI Here due to left arm pain  Has a knot by left elbow---not sure how long is has been there There for a month or 2---no pain (just some tenderness)  Now with pain down medial left forearm and arm when she lifts and turns Was going to see Dr [place] daughter told her to come here (concerned about vein)  No treatment  Current Outpatient Medications on File Prior to Visit  Medication Sig Dispense Refill   atorvastatin (LIPITOR) 40 MG tablet TAKE 1 TABLET DAILY 90 tablet 3   Calcium Carbonate-Vitamin D (CALCIUM + D PO) Take 1 tablet by mouth 2 (two) times daily before a meal.      cetirizine (ZYRTEC) 10 MG tablet Take 10 mg by mouth daily as needed for allergies.     EPINEPHrine 0.3 mg/0.3 mL IJ SOAJ injection Inject 0.3 mg into the muscle as needed for anaphylaxis. 1 each 1   esomeprazole (NEXIUM) 40 MG capsule Take 1 capsule (40 mg total) by mouth daily. 90 capsule 3   fluticasone (FLONASE) 50 MCG/ACT nasal spray Place 2 sprays into both nostrils daily.     Multiple Vitamins-Minerals (OCUVITE ADULT 50+ PO) Take by mouth.     Multiple Vitamins-Minerals (WOMENS MULTIVITAMIN PLUS PO) Take 1 tablet by mouth daily.      neomycin-polymyxin-hydrocortisone (CORTISPORIN) 3.5-10000-1 OTIC suspension Place 4 drops into the right ear 4 (four) times daily. 10 mL 0   traMADol (ULTRAM) 50 MG tablet Take 1 tablet (50 mg total) by mouth daily as needed. 30 tablet 0   No current facility-administered medications on file prior to visit.    Allergies  Allergen Reactions   Penicillins Anaphylaxis   Covid-19 (Mrna) Vaccine Other (See Comments)    Local swelling and redness.   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   DIVERTICULOSIS, COLON 04/20/2007   GERD 04/20/2007   H/O hiatal hernia    Heart murmur    told by PCP- years ago-never had echo   HEPATOMEGALY, HX OF 12/21/2009   HYPERLIPIDEMIA 04/20/2007   HYPERTENSION 04/20/2007   LOW BACK PAIN 08/16/2008   MENOPAUSAL SYNDROME 04/20/2007   PEPTIC ULCER DISEASE 09/06/2010   Personal history of chemotherapy    2015   Personal history of radiation therapy    2015   Wears glasses     Past Surgical History:  Procedure Laterality Date   ABDOMINAL HYSTERECTOMY     BREAST LUMPECTOMY Left    10/07/13 & 10/25/13   BREAST LUMPECTOMY WITH NEEDLE LOCALIZATION Left 10/07/2013   Procedure: BREAST LUMPECTOMY WITH NEEDLE LOCALIZATION EXCISION OF BILATERAL AXILLARY SKIN TAGS ;  Surgeon: Mariella Saa, MD;  Location: [facility];  Service: General;  Laterality: Left;   BREAST SURGERY     30 rounds radiation   CARPAL TUNNEL RELEASE Bilateral    CHOLECYSTECTOMY     COLONOSCOPY     RE-EXCISION OF BREAST LUMPECTOMY Left 10/25/2013   Procedure: RE-EXCISION OF BREAST LUMPECTOMY;  Surgeon: Mariella Saa, MD;  Location: [facility];  Service: General;  Laterality: Left;  clean wound class   TUBAL LIGATION      Family History  Problem Relation Age of Onset   Hypertension Mother    Hyperlipidemia Mother    Heart disease Mother        congestive heart failure   Congestive Heart Failure Mother    Cancer Father        hx of melanoma   Hypertension Sister    Skin cancer Sister 20       basal cell carcinoma   Hypertension Brother    Heart disease Maternal Grandmother 50   Breast cancer Maternal Aunt 60   Colon cancer Paternal Grandmother 64   Heart disease Maternal Aunt 42   Prostate cancer Cousin     Social History   Socioeconomic History   Marital status: Widowed    Spouse name: Not on file   Number of children: 2   Years  of education: Not on file   Highest education level: Associate degree: occupational, Scientist, product/process development, or vocational program  Occupational History    Employer: [employer]    Comment: Retired  Tobacco Use   Smoking status: Former    Current packs/day: 0.00    Types: Cigarettes    Quit date: 10/21/1978    Years since quitting: 44.7    Passive exposure: Never   Smokeless tobacco: Never  Substance and Sexual Activity   Alcohol use: No   Drug use: No   Sexual activity: Not Currently  Other Topics Concern   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   2 daughters      No living will   Daughters should be medical decision makers   Would accept resuscitation   Wouldn't want tube feeds if cognitively unaware   Social Determinants of Health   Financial Resource Strain: Low Risk  (02/17/2023)   Overall Financial Resource Strain (CARDIA)    Difficulty of Paying Living Expenses: Not hard at all  Food Insecurity: No Food Insecurity (02/17/2023)   Hunger Vital Sign    Worried About Running Out of Food in the Last Year: Never true    Ran Out of Food in the Last Year: Never true  Transportation Needs: No Transportation Needs (02/17/2023)   PRAPARE - Administrator, Civil Service (Medical): No    Lack of Transportation (Non-Medical): No  Physical Activity: Unknown (02/17/2023)   Exercise Vital Sign    Days of Exercise per Week: 0 days    Minutes of Exercise per Session: Not on file  Stress: No Stress Concern Present (02/17/2023)   [employer] of Occupational Health - Occupational Stress Questionnaire    Feeling of Stress : Only a little  Social Connections: Moderately Integrated (02/17/2023)   Social Connection and Isolation Panel [NHANES]    Frequency of Communication with Friends and Family: More than three times a week    Frequency of Social Gatherings with Friends and Family: More than three times a week    Attends Religious  Services: More than 4 times per year    Active Member of [employer] or Organizations: Yes    Attends Banker Meetings: More than 4 times per year    Marital Status: Widowed  Intimate Partner Violence: Not on file   Review of Systems No hand swelling Left arm is weaker--due to chronic torn  cartilage in shoulder Has lost some weight --trying to eat better    Objective:   Physical Exam Constitutional:      Appearance: Normal appearance.  Musculoskeletal:     Comments: Mildly tender superficial mass in medial left antecubital fossa Has pain along medial forearm to distal arm in the same line Seems to have tenderness and slight ?cord just distal to the mass  Neurological:     Mental Status: She is alert.            Assessment & Plan:

## 2023-07-28 ENCOUNTER — Encounter: Payer: Self-pay | Admitting: Internal Medicine

## 2023-07-28 ENCOUNTER — Ambulatory Visit (INDEPENDENT_AMBULATORY_CARE_PROVIDER_SITE_OTHER): Payer: Medicare HMO | Admitting: Internal Medicine

## 2023-07-28 VITALS — BP 132/74 | HR 77 | Temp 98.0°F | Ht 61.25 in | Wt 174.0 lb

## 2023-07-28 DIAGNOSIS — M25551 Pain in right hip: Secondary | ICD-10-CM | POA: Diagnosis not present

## 2023-07-28 DIAGNOSIS — Z0000 Encounter for general adult medical examination without abnormal findings: Secondary | ICD-10-CM | POA: Diagnosis not present

## 2023-07-28 DIAGNOSIS — K2100 Gastro-esophageal reflux disease with esophagitis, without bleeding: Secondary | ICD-10-CM | POA: Diagnosis not present

## 2023-07-28 DIAGNOSIS — E785 Hyperlipidemia, unspecified: Secondary | ICD-10-CM | POA: Diagnosis not present

## 2023-07-28 DIAGNOSIS — Z23 Encounter for immunization: Secondary | ICD-10-CM

## 2023-07-28 DIAGNOSIS — I10 Essential (primary) hypertension: Secondary | ICD-10-CM | POA: Diagnosis not present

## 2023-07-28 LAB — LIPID PANEL
Cholesterol: 173 mg/dL (ref 0–200)
HDL: 56.5 mg/dL (ref >39.00)
LDL Cholesterol: 79 mg/dL (ref 0–99)
NonHDL: 116.15
Total CHOL/HDL Ratio: 3
Triglycerides: 185 mg/dL — ABNORMAL HIGH (ref 0.0–149.0)
VLDL: 37 mg/dL (ref 0.0–40.0)

## 2023-07-28 LAB — CBC
HCT: 43.7 % (ref 36.0–46.0)
Hemoglobin: 14.2 g/dL (ref 12.0–15.0)
MCHC: 32.4 g/dL (ref 30.0–36.0)
MCV: 86.7 fL (ref 78.0–100.0)
Platelets: 217 10*3/uL (ref 150.0–400.0)
RBC: 5.04 Mil/uL (ref 3.87–5.11)
RDW: 13.1 % (ref 11.5–15.5)
WBC: 7.1 10*3/uL (ref 4.0–10.5)

## 2023-07-28 LAB — COMPREHENSIVE METABOLIC PANEL
ALT: 28 U/L (ref 0–35)
AST: 26 U/L (ref 0–37)
Albumin: 4.3 g/dL (ref 3.5–5.2)
Alkaline Phosphatase: 78 U/L (ref 39–117)
BUN: 11 mg/dL (ref 6–23)
CO2: 29 meq/L (ref 19–32)
Calcium: 9.4 mg/dL (ref 8.4–10.5)
Chloride: 103 meq/L (ref 96–112)
Creatinine, Ser: 0.95 mg/dL (ref 0.40–1.20)
GFR: 60.73 mL/min (ref >60.00)
Glucose, Bld: 106 mg/dL — ABNORMAL HIGH (ref 70–99)
Potassium: 3.9 meq/L (ref 3.5–5.1)
Sodium: 143 meq/L (ref 135–145)
Total Bilirubin: 0.6 mg/dL (ref 0.2–1.2)
Total Protein: 6.8 g/dL (ref 6.0–8.3)

## 2023-07-28 NOTE — Progress Notes (Signed)
Subjective:    Patient ID: Keturah Barre, female    DOB: 02/19/1951, 72 y.o.   MRN: [id number]  HPI Here for Medicare wellness visit and follow up of chronic health conditions Reviewed advanced directives Reviewed other doctors---Dr Horvath--gyn,, Dr Bartholome Bill, Dr Schmidt--audiology, Dr Manus Rudd, Dr Chryl Heck, Ms Bronson--TP, Dr Haskel Schroeder, Dr Tonna Boehringer No hospitalizations or surgery in the past year Not doing much exercise Trouble with vision--may need cataracts done soon Hearing is okay---having Eustachian tube issues No alcohol or tobacco No falls Does have some mood issues--May and July---thinking about deceased husband. Not anhedonic Independent with instrumental ADLs Some recall issues---no functional issues  Seeing ortho tomorrow Trouble with right hip again--trouble walking Has tramadol for emergencies  Vein in left arm is better--but still not right No pain  Heartburn mostly controlled  Takes nexium daily--didn't tolerate every other day No dysphagia-but rare pill sensation in throat  Current Outpatient Medications on File Prior to Visit  Medication Sig Dispense Refill   atorvastatin (LIPITOR) 40 MG tablet TAKE 1 TABLET DAILY 90 tablet 3   Calcium Carbonate-Vitamin D (CALCIUM + D PO) Take 1 tablet by mouth 2 (two) times daily before a meal.      cetirizine (ZYRTEC) 10 MG tablet Take 10 mg by mouth daily as needed for allergies.     EPINEPHrine 0.3 mg/0.3 mL IJ SOAJ injection Inject 0.3 mg into the muscle as needed for anaphylaxis. 1 each 1   esomeprazole (NEXIUM) 40 MG capsule Take 1 capsule (40 mg total) by mouth daily. 90 capsule 3   Multiple Vitamins-Minerals (OCUVITE ADULT 50+ PO) Take by mouth.     Multiple Vitamins-Minerals (WOMENS MULTIVITAMIN PLUS PO) Take 1 tablet by mouth daily.      traMADol (ULTRAM) 50 MG tablet Take 1 tablet (50 mg total) by mouth daily as needed. 30 tablet 0   No current facility-administered medications on file  prior to visit.    Allergies  Allergen Reactions   Penicillins Anaphylaxis   Covid-19 (Mrna) Vaccine Other (See Comments)    Local swelling and redness.   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   DIVERTICULOSIS, COLON 04/20/2007   GERD 04/20/2007   H/O hiatal hernia    Heart murmur    told by PCP- years ago-never had echo   HEPATOMEGALY, HX OF 12/21/2009   HYPERLIPIDEMIA 04/20/2007   HYPERTENSION 04/20/2007   LOW BACK PAIN 08/16/2008   MENOPAUSAL SYNDROME 04/20/2007   PEPTIC ULCER DISEASE 09/06/2010   Personal history of chemotherapy    2015   Personal history of radiation therapy    2015   Wears glasses     Past Surgical History:  Procedure Laterality Date   ABDOMINAL HYSTERECTOMY     BREAST LUMPECTOMY Left    10/07/13 & 10/25/13   BREAST LUMPECTOMY WITH NEEDLE LOCALIZATION Left 10/07/2013   Procedure: BREAST LUMPECTOMY WITH NEEDLE LOCALIZATION EXCISION OF BILATERAL AXILLARY SKIN TAGS ;  Surgeon: Mariella Saa, MD;  Location: [facility];  Service: General;  Laterality: Left;   BREAST SURGERY     30 rounds radiation   CARPAL TUNNEL  RELEASE Bilateral    CHOLECYSTECTOMY     COLONOSCOPY     RE-EXCISION OF BREAST LUMPECTOMY Left 10/25/2013   Procedure: RE-EXCISION OF BREAST LUMPECTOMY;  Surgeon: Mariella Saa, MD;  Location: [facility];  Service: General;  Laterality: Left;  clean wound class   TUBAL LIGATION      Family History  Problem Relation Age of Onset   Hypertension Mother    Hyperlipidemia Mother    Heart disease Mother        congestive heart failure   Congestive Heart Failure Mother    Cancer Father        hx of melanoma   Hypertension Sister     Skin cancer Sister 44       basal cell carcinoma   Hypertension Brother    Heart disease Maternal Grandmother 49   Breast cancer Maternal Aunt 60   Colon cancer Paternal Grandmother 19   Heart disease Maternal Aunt 42   Prostate cancer Cousin     Social History   Socioeconomic History   Marital status: Widowed    Spouse name: Not on file   Number of children: 2   Years of education: Not on file   Highest education level: Associate degree: occupational, Scientist, product/process development, or vocational program  Occupational History    Employer: [employer]    Comment: Retired  Tobacco Use   Smoking status: Former    Current packs/day: 0.00    Types: Cigarettes    Quit date: 10/21/1978    Years since quitting: 44.7    Passive exposure: Never   Smokeless tobacco: Never  Substance and Sexual Activity   Alcohol use: No   Drug use: No   Sexual activity: Not Currently  Other Topics Concern   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   2 daughters      No living will   Daughters should be medical decision makers   Would accept resuscitation   Wouldn't want tube feeds if cognitively unaware   Social Determinants of Health   Financial Resource Strain: Low Risk  (02/17/2023)   Overall Financial Resource Strain (CARDIA)    Difficulty of Paying Living Expenses: Not hard at all  Food Insecurity: No Food Insecurity (02/17/2023)   Hunger Vital Sign    Worried About Running Out of Food in the Last Year: Never true    Ran Out of Food in the Last Year: Never true  Transportation Needs: No Transportation Needs (02/17/2023)   PRAPARE - Administrator, Civil Service (Medical): No    Lack of Transportation (Non-Medical): No  Physical Activity: Unknown (02/17/2023)   Exercise Vital Sign    Days of Exercise per Week: 0 days    Minutes of Exercise per Session: Not on file  Stress: No Stress Concern Present (02/17/2023)   [employer] of  Occupational Health - Occupational Stress Questionnaire    Feeling of Stress : Only a little  Social Connections: Moderately Integrated (02/17/2023)   Social Connection and Isolation Panel [NHANES]    Frequency of Communication with Friends and Family: More than three times a week    Frequency of Social Gatherings with Friends and Family: More than three times a week    Attends Religious Services: More than 4 times per year    Active Member of [employer] or Organizations: Yes    Attends Banker Meetings: More than 4 times per  year    Marital Status: Widowed  Intimate Partner Violence: Not on file   Review of Systems Appetite is okay Weight is stable Sleeps fine--but not that long. Does nap Wears seat belt Teeth okay--keeps up with dentist Bowels are fine--no blood No suspicious skin lesions No urinary problems No chest pain or SOB No dizziness or syncope BP generally okay ---other than if she is stressed (usually ~140)    Objective:   Physical Exam Constitutional:      Appearance: Normal appearance.  HENT:     Mouth/Throat:     Pharynx: No oropharyngeal exudate or posterior oropharyngeal erythema.  Eyes:     Conjunctiva/sclera: Conjunctivae normal.     Pupils: Pupils are equal, round, and reactive to light.  Cardiovascular:     Rate and Rhythm: Normal rate and regular rhythm.     Pulses: Normal pulses.     Heart sounds: No murmur heard.    No gallop.  Pulmonary:     Effort: Pulmonary effort is normal.     Breath sounds: Normal breath sounds. No wheezing or rales.  Abdominal:     Palpations: Abdomen is soft.     Tenderness: There is no abdominal tenderness.  Musculoskeletal:     Cervical back: Neck supple.     Right lower leg: No edema.     Left lower leg: No edema.  Lymphadenopathy:     Cervical: No cervical adenopathy.  Skin:    Findings: No rash.  Neurological:     General: No focal deficit present.     Mental Status: She is alert and oriented to  person, place, and time.     Comments: Word naming---11/30 seconds Recall 2/3  Psychiatric:        Mood and Affect: Mood normal.        Behavior: Behavior normal.            Assessment & Plan:

## 2023-07-28 NOTE — Assessment & Plan Note (Signed)
Doing okay on atorvastatin 40 daily

## 2023-07-28 NOTE — Progress Notes (Signed)
Hearing Screening - Comments:: Passed whisper test Vision Screening - Comments:: December 2023

## 2023-07-28 NOTE — Assessment & Plan Note (Signed)
Going back to ortho this week

## 2023-07-28 NOTE — Addendum Note (Signed)
Addended by: Tillman Abide I on: 07/28/2023 09:16 AM   Modules accepted: Orders

## 2023-07-28 NOTE — Assessment & Plan Note (Signed)
Does okay with daily nexium 40mg  Hasn't tolerated skipping days

## 2023-07-28 NOTE — Assessment & Plan Note (Signed)
BP Readings from Last 3 Encounters:  07/28/23 132/74  06/25/23 (!) 140/78  02/17/23 132/74   Controlled without meds for the most part

## 2023-07-28 NOTE — Assessment & Plan Note (Signed)
I have personally reviewed the Medicare Annual Wellness questionnaire and have noted 1. The patient's medical and social history 2. Their use of alcohol, tobacco or illicit drugs 3. Their current medications and supplements 4. The patient's functional ability including ADL's, fall risks, home safety risks and hearing or visual             impairment. 5. Diet and physical activities 6. Evidence for depression or mood disorders  The patients weight, height, BMI and visual acuity have been recorded in the chart I have made referrals, counseling and provided education to the patient based review of the above and I have provided the pt with a written personalized care plan for preventive services.  I have provided you with a copy of your personalized plan for preventive services. Please take the time to review along with your updated medication list.  Last screening colon due 2026 Needs to restart exercise Prefers no COVID update--reaction to past Flu vaccine today Prefers no shingrix

## 2023-07-29 DIAGNOSIS — M25551 Pain in right hip: Secondary | ICD-10-CM | POA: Diagnosis not present

## 2023-08-08 ENCOUNTER — Encounter: Payer: Self-pay | Admitting: Internal Medicine

## 2023-08-21 ENCOUNTER — Encounter: Payer: Self-pay | Admitting: Internal Medicine

## 2023-08-21 ENCOUNTER — Ambulatory Visit (INDEPENDENT_AMBULATORY_CARE_PROVIDER_SITE_OTHER): Payer: Medicare HMO | Admitting: Internal Medicine

## 2023-08-21 VITALS — BP 138/80 | HR 77 | Temp 98.5°F | Ht 61.25 in | Wt 172.0 lb

## 2023-08-21 DIAGNOSIS — H6591 Unspecified nonsuppurative otitis media, right ear: Secondary | ICD-10-CM | POA: Diagnosis not present

## 2023-08-21 MED ORDER — DOXYCYCLINE HYCLATE 100 MG PO TABS: 100.0000 mg | ORAL_TABLET | Freq: Two times a day (BID) | ORAL | 0 refills | Status: DC

## 2023-08-21 NOTE — Assessment & Plan Note (Signed)
Probably with sinusitis as well Analgesics Will try doxycycline 100 bid x 7 days If doesn't tolerate--will try cefuroxime or cefadroxil (tolerates cephalosporins)

## 2023-08-21 NOTE — Progress Notes (Signed)
Subjective:    Patient ID: Katie Becker, female    DOB: Jun 20, 1951, 72 y.o.   MRN: [id number]  HPI Here due to right ear pain  Got sick last week Swollen, sore throat Went into chest Finally felt better after a few days  Congestion and tightness in upper chest still there Now with right ear pain--since yesterday --constant now No fever Intermittent fatigue Some headaches--tylenol helps some Does have post nasal drip  Mucinex DM helped some  Current Outpatient Medications on File Prior to Visit  Medication Sig Dispense Refill   atorvastatin (LIPITOR) 40 MG tablet TAKE 1 TABLET DAILY 90 tablet 3   Calcium Carbonate-Vitamin D (CALCIUM + D PO) Take 1 tablet by mouth 2 (two) times daily before a meal.      cetirizine (ZYRTEC) 10 MG tablet Take 10 mg by mouth daily as needed for allergies.     EPINEPHrine 0.3 mg/0.3 mL IJ SOAJ injection Inject 0.3 mg into the muscle as needed for anaphylaxis. 1 each 1   esomeprazole (NEXIUM) 40 MG capsule Take 1 capsule (40 mg total) by mouth daily. 90 capsule 3   Multiple Vitamins-Minerals (OCUVITE ADULT 50+ PO) Take by mouth.     Multiple Vitamins-Minerals (WOMENS MULTIVITAMIN PLUS PO) Take 1 tablet by mouth daily.      traMADol (ULTRAM) 50 MG tablet Take 1 tablet (50 mg total) by mouth daily as needed. 30 tablet 0   No current facility-administered medications on file prior to visit.    Allergies  Allergen Reactions   Penicillins Anaphylaxis   Covid-19 (Mrna) Vaccine Other (See Comments)    Local swelling and redness.   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   DIVERTICULOSIS, COLON 04/20/2007   GERD 04/20/2007   H/O hiatal hernia    Heart  murmur    told by PCP- years ago-never had echo   HEPATOMEGALY, HX OF 12/21/2009   HYPERLIPIDEMIA 04/20/2007   HYPERTENSION 04/20/2007   LOW BACK PAIN 08/16/2008   MENOPAUSAL SYNDROME 04/20/2007   PEPTIC ULCER DISEASE 09/06/2010   Personal history of chemotherapy    2015   Personal history of radiation therapy    2015   Wears glasses     Past Surgical History:  Procedure Laterality Date   ABDOMINAL HYSTERECTOMY     BREAST LUMPECTOMY Left    10/07/13 & 10/25/13   BREAST LUMPECTOMY WITH NEEDLE LOCALIZATION Left 10/07/2013   Procedure: BREAST LUMPECTOMY WITH NEEDLE LOCALIZATION EXCISION OF BILATERAL AXILLARY SKIN TAGS ;  Surgeon: Mariella Saa, MD;  Location: [facility];  Service: General;  Laterality: Left;   BREAST SURGERY     30 rounds radiation   CARPAL TUNNEL RELEASE Bilateral    CHOLECYSTECTOMY     COLONOSCOPY     RE-EXCISION OF BREAST LUMPECTOMY Left 10/25/2013   Procedure: RE-EXCISION OF BREAST LUMPECTOMY;  Surgeon: Mariella Saa, MD;  Location: [facility];  Service: General;  Laterality: Left;  clean wound class   TUBAL LIGATION      Family History  Problem Relation Age of Onset   Hypertension Mother  Hyperlipidemia Mother    Heart disease Mother        congestive heart failure   Congestive Heart Failure Mother    Cancer Father        hx of melanoma   Hypertension Sister    Skin cancer Sister 28       basal cell carcinoma   Hypertension Brother    Heart disease Maternal Grandmother 31   Breast cancer Maternal Aunt 52   Colon cancer Paternal Grandmother 76   Heart disease Maternal Aunt 42   Prostate cancer Cousin     Social History   Socioeconomic History   Marital status: Widowed    Spouse name: Not on file   Number of children: 2   Years of education: Not on file   Highest education level: Associate degree: occupational, Scientist, product/process development, or vocational program  Occupational History    Employer: [employer]     Comment: Retired  Tobacco Use   Smoking status: Former    Current packs/day: 0.00    Types: Cigarettes    Quit date: 10/21/1978    Years since quitting: 44.8    Passive exposure: Never   Smokeless tobacco: Never  Substance and Sexual Activity   Alcohol use: No   Drug use: No   Sexual activity: Not Currently  Other Topics Concern   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   2 daughters      No living will   Daughters should be medical decision makers   Would accept resuscitation   Wouldn't want tube feeds if cognitively unaware   Social Determinants of Health   Financial Resource Strain: Low Risk  (02/17/2023)   Overall Financial Resource Strain (CARDIA)    Difficulty of Paying Living Expenses: Not hard at all  Food Insecurity: No Food Insecurity (02/17/2023)   Hunger Vital Sign    Worried About Running Out of Food in the Last Year: Never true    Ran Out of Food in the Last Year: Never true  Transportation Needs: No Transportation Needs (02/17/2023)   PRAPARE - Administrator, Civil Service (Medical): No    Lack of Transportation (Non-Medical): No  Physical Activity: Unknown (02/17/2023)   Exercise Vital Sign    Days of Exercise per Week: 0 days    Minutes of Exercise per Session: Not on file  Stress: No Stress Concern Present (02/17/2023)   [employer] of Occupational Health - Occupational Stress Questionnaire    Feeling of Stress : Only a little  Social Connections: Moderately Integrated (02/17/2023)   Social Connection and Isolation Panel [NHANES]    Frequency of Communication with Friends and Family: More than three times a week    Frequency of Social Gatherings with Friends and Family: More than three times a week    Attends Religious Services: More than 4 times per year    Active Member of [employer] or Organizations: Yes    Attends Banker Meetings: More than 4 times per year    Marital Status: Widowed  Intimate  Partner Violence: Not on file    Review of Systems No vomiting. Some nausea--from the cold meds? Able to eat    Objective:   Physical Exam Constitutional:      Appearance: Normal appearance.  HENT:     Head:     Comments: No sinus tenderness    Left Ear: Tympanic membrane and ear canal normal.  Ears:     Comments: Right TM red/inflamed    Mouth/Throat:     Pharynx: No oropharyngeal exudate or posterior oropharyngeal erythema.  Pulmonary:     Effort: Pulmonary effort is normal.     Breath sounds: Normal breath sounds. No wheezing or rales.  Musculoskeletal:     Cervical back: Neck supple.  Lymphadenopathy:     Cervical: No cervical adenopathy.  Neurological:     Mental Status: She is alert.            Assessment & Plan:

## 2023-08-27 ENCOUNTER — Encounter: Payer: Self-pay | Admitting: Internal Medicine

## 2023-08-28 ENCOUNTER — Other Ambulatory Visit: Payer: Self-pay | Admitting: Internal Medicine

## 2023-09-04 DIAGNOSIS — H2513 Age-related nuclear cataract, bilateral: Secondary | ICD-10-CM | POA: Diagnosis not present

## 2023-09-17 DIAGNOSIS — H04123 Dry eye syndrome of bilateral lacrimal glands: Secondary | ICD-10-CM | POA: Diagnosis not present

## 2023-09-17 DIAGNOSIS — H01024 Squamous blepharitis left upper eyelid: Secondary | ICD-10-CM | POA: Diagnosis not present

## 2023-09-17 DIAGNOSIS — H2513 Age-related nuclear cataract, bilateral: Secondary | ICD-10-CM | POA: Diagnosis not present

## 2023-09-27 ENCOUNTER — Other Ambulatory Visit: Payer: Self-pay | Admitting: Internal Medicine

## 2023-10-06 DIAGNOSIS — Z0100 Encounter for examination of eyes and vision without abnormal findings: Secondary | ICD-10-CM | POA: Diagnosis not present

## 2023-10-10 ENCOUNTER — Other Ambulatory Visit: Payer: Self-pay | Admitting: Internal Medicine

## 2023-10-10 DIAGNOSIS — Z1231 Encounter for screening mammogram for malignant neoplasm of breast: Secondary | ICD-10-CM

## 2023-10-29 DIAGNOSIS — H2513 Age-related nuclear cataract, bilateral: Secondary | ICD-10-CM | POA: Diagnosis not present

## 2023-10-29 DIAGNOSIS — H01024 Squamous blepharitis left upper eyelid: Secondary | ICD-10-CM | POA: Diagnosis not present

## 2023-10-29 DIAGNOSIS — H04123 Dry eye syndrome of bilateral lacrimal glands: Secondary | ICD-10-CM | POA: Diagnosis not present

## 2023-11-03 ENCOUNTER — Ambulatory Visit: Payer: Medicare HMO

## 2023-11-06 ENCOUNTER — Ambulatory Visit
Admission: RE | Admit: 2023-11-06 | Discharge: 2023-11-06 | Disposition: A | Discharge status: Home / Self Care | Payer: Medicare HMO | Source: Ambulatory Visit | Attending: Internal Medicine | Admitting: Internal Medicine

## 2023-11-06 DIAGNOSIS — Z1231 Encounter for screening mammogram for malignant neoplasm of breast: Secondary | ICD-10-CM

## 2023-11-10 ENCOUNTER — Encounter: Payer: Self-pay | Admitting: Internal Medicine

## 2023-11-19 DIAGNOSIS — M47812 Spondylosis without myelopathy or radiculopathy, cervical region: Secondary | ICD-10-CM | POA: Diagnosis not present

## 2023-11-19 DIAGNOSIS — M542 Cervicalgia: Secondary | ICD-10-CM | POA: Diagnosis not present

## 2023-12-17 ENCOUNTER — Encounter: Payer: Self-pay | Admitting: Internal Medicine

## 2023-12-17 MED ORDER — EPINEPHRINE 0.3 MG/0.3ML IJ SOAJ: 0.3000 mg | INTRAMUSCULAR | 1 refills | Status: AC | PRN

## 2024-02-12 ENCOUNTER — Ambulatory Visit (INDEPENDENT_AMBULATORY_CARE_PROVIDER_SITE_OTHER): Admitting: Internal Medicine

## 2024-02-12 VITALS — BP 128/80 | HR 82 | Temp 97.9°F | Ht 61.25 in | Wt 176.0 lb

## 2024-02-12 DIAGNOSIS — R1032 Left lower quadrant pain: Secondary | ICD-10-CM | POA: Diagnosis not present

## 2024-02-12 NOTE — Assessment & Plan Note (Signed)
 Reviewed---the 5 week course, etc---not diverticulitis Seems to be in the abdominal wall--not inguinal Can try heat/lidocaine  patch Limit lifting for now

## 2024-02-12 NOTE — Progress Notes (Signed)
 Subjective:    Patient ID: Katie Becker, female    DOB: Feb 06, 1951, 73 y.o.   MRN: [id number]  HPI Here due to LLQ abdominal pain  Has some LLQ pain--tender when she presses First noticed 5 weeks ago No falls and doesn't remember any lifting, etc Some pain with walking---mild Feels warm--no lump  Thought it might be diverticulitis--mild diverticuli noted in 2016  No treatment  Current Outpatient Medications on File Prior to Visit  Medication Sig Dispense Refill   atorvastatin  (LIPITOR) 40 MG tablet TAKE 1 TABLET DAILY 90 tablet 3   Calcium  Carbonate-Vitamin D (CALCIUM  + D PO) Take 1 tablet by mouth 2 (two) times daily before a meal.      cetirizine (ZYRTEC) 10 MG tablet Take 10 mg by mouth daily as needed for allergies.     EPINEPHrine  0.3 mg/0.3 mL IJ SOAJ injection Inject 0.3 mg into the muscle as needed for anaphylaxis. 1 each 1   esomeprazole  (NEXIUM ) 40 MG capsule TAKE 1 CAPSULE DAILY. 90 capsule 3   Multiple Vitamins-Minerals (OCUVITE ADULT 50+ PO) Take by mouth.     Multiple Vitamins-Minerals (WOMENS MULTIVITAMIN PLUS PO) Take 1 tablet by mouth daily.      traMADol  (ULTRAM ) 50 MG tablet Take 1 tablet (50 mg total) by mouth daily as needed. 30 tablet 0   No current facility-administered medications on file prior to visit.    Allergies  Allergen Reactions   Penicillins Anaphylaxis   Covid-19 (Mrna) Vaccine Other (See Comments)    Local swelling and redness.   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   DIVERTICULOSIS, COLON 04/20/2007   GERD 04/20/2007   H/O hiatal hernia    Heart murmur    told by PCP- years ago-never had echo   HEPATOMEGALY, HX OF 12/21/2009    HYPERLIPIDEMIA 04/20/2007   HYPERTENSION 04/20/2007   LOW BACK PAIN 08/16/2008   MENOPAUSAL SYNDROME 04/20/2007   PEPTIC ULCER DISEASE 09/06/2010   Personal history of chemotherapy    2015   Personal history of radiation therapy    2015   Wears glasses     Past Surgical History:  Procedure Laterality Date   ABDOMINAL HYSTERECTOMY     BREAST LUMPECTOMY Left    10/07/13 & 10/25/13   BREAST LUMPECTOMY WITH NEEDLE LOCALIZATION Left 10/07/2013   Procedure: BREAST LUMPECTOMY WITH NEEDLE LOCALIZATION EXCISION OF BILATERAL AXILLARY SKIN TAGS ;  Surgeon: Quitman Bucy, MD;  Location: [facility];  Service: General;  Laterality: Left;   BREAST SURGERY     30 rounds radiation   CARPAL TUNNEL RELEASE Bilateral    CHOLECYSTECTOMY     COLONOSCOPY     RE-EXCISION OF BREAST LUMPECTOMY Left 10/25/2013   Procedure: RE-EXCISION OF BREAST LUMPECTOMY;  Surgeon: Quitman Bucy, MD;  Location: [facility];  Service: General;  Laterality: Left;  clean wound class   TUBAL LIGATION      Family History  Problem Relation Age of Onset   Hypertension Mother    Hyperlipidemia Mother    Heart disease Mother  congestive heart failure   Congestive Heart Failure Mother    Cancer Father        hx of melanoma   Hypertension Sister    Skin cancer Sister 82       basal cell carcinoma   Hypertension Brother    Heart disease Maternal Grandmother 51   Breast cancer Maternal Aunt 60   Colon cancer Paternal Grandmother 33   Heart disease Maternal Aunt 42   Prostate cancer Cousin     Social History   Socioeconomic History   Marital status: Widowed    Spouse name: Not on file   Number of children: 2   Years of education: Not on file   Highest education level: Associate degree: occupational, Scientist, product/process development, or vocational program  Occupational History    Employer: [employer]    Comment: Retired  Tobacco Use   Smoking status: Former    Current packs/day: 0.00     Types: Cigarettes    Quit date: 10/21/1978    Years since quitting: 45.3    Passive exposure: Never   Smokeless tobacco: Never  Substance and Sexual Activity   Alcohol use: No   Drug use: No   Sexual activity: Not Currently  Other Topics Concern   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   2 daughters      No living will   Daughters should be medical decision makers   Would accept resuscitation   Wouldn't want tube feeds if cognitively unaware   Social Drivers of Health   Financial Resource Strain: Low Risk  (02/12/2024)   Overall Financial Resource Strain (CARDIA)    Difficulty of Paying Living Expenses: Not hard at all  Food Insecurity: No Food Insecurity (02/12/2024)   Hunger Vital Sign    Worried About Running Out of Food in the Last Year: Never true    Ran Out of Food in the Last Year: Never true  Transportation Needs: No Transportation Needs (02/12/2024)   PRAPARE - Administrator, Civil Service (Medical): No    Lack of Transportation (Non-Medical): No  Physical Activity: Unknown (02/12/2024)   Exercise Vital Sign    Days of Exercise per Week: 0 days    Minutes of Exercise per Session: Not on file  Stress: No Stress Concern Present (02/12/2024)   [employer] of Occupational Health - Occupational Stress Questionnaire    Feeling of Stress : Not at all  Social Connections: Moderately Integrated (02/12/2024)   Social Connection and Isolation Panel [NHANES]    Frequency of Communication with Friends and Family: More than three times a week    Frequency of Social Gatherings with Friends and Family: More than three times a week    Attends Religious Services: More than 4 times per year    Active Member of [employer] or Organizations: Yes    Attends Banker Meetings: Never    Marital Status: Widowed  Intimate Partner Violence: Not on file   Review of Systems Some change in stool color--but moving okay. If constipated, will  eat apple and more water and she improves No blood in stool No N/V No fever--but slight hot flash this morning Eating okay--trying to eat healthy    Objective:   Physical Exam Abdominal:     General: There is no distension.     Palpations: There is no mass.     Tenderness: There is no guarding or rebound.  Comments: Soft Slight tenderness--more in the abdominal pannus than apparently intraperitoneal No inguinal tenderness or mass/node            Assessment & Plan:

## 2024-03-09 ENCOUNTER — Ambulatory Visit: Payer: Self-pay

## 2024-03-09 ENCOUNTER — Telehealth (INDEPENDENT_AMBULATORY_CARE_PROVIDER_SITE_OTHER): Admitting: Internal Medicine

## 2024-03-09 ENCOUNTER — Encounter: Payer: Self-pay | Admitting: Internal Medicine

## 2024-03-09 VITALS — Temp 98.0°F | Ht 61.25 in | Wt 175.0 lb

## 2024-03-09 DIAGNOSIS — R197 Diarrhea, unspecified: Secondary | ICD-10-CM | POA: Insufficient documentation

## 2024-03-09 NOTE — Telephone Encounter (Signed)
 Copied from CRM [id number]. Topic: Clinical - Red Word Triage >> Mar 09, 2024  8:27 AM Baldomero Bone wrote: Red Word that prompted transfer to Nurse Triage: Patient has been experiencing diarrhea since 1 AM last night. Patient has IBS but never experienced anything like this. Callback number is [phone]   Chief Complaint: Diarrhea since 1:00 this morning.Has IBS. Symptoms: above Frequency: today Pertinent Negatives: Patient denies pain Disposition: [] ED /[] Urgent Care (no appt availability in office) / [x] Appointment(In office/virtual)/ []  [facility]/ [] Home Care/ [] Refused Recommended Disposition /[]  Mobile Bus/ []  Follow-up with PCP Additional Notes: agrees with appointment.  Reason for Disposition  [1] SEVERE diarrhea (e.g., 7 or more times / day more than normal) AND [2] age > 60 years  Answer Assessment - Initial Assessment Questions 1. DIARRHEA SEVERITY: "How bad is the diarrhea?" "How many more stools have you had in the past 24 hours than normal?"    - NO DIARRHEA (SCALE 0)   - MILD (SCALE 1-3): Few loose or mushy BMs; increase of 1-3 stools over normal daily number of stools; mild increase in ostomy output.   -  MODERATE (SCALE 4-7): Increase of 4-6 stools daily over normal; moderate increase in ostomy output.   -  SEVERE (SCALE 8-10; OR "WORST POSSIBLE"): Increase of 7 or more stools daily over normal; moderate increase in ostomy output; incontinence.     severe 2. ONSET: "When did the diarrhea begin?"      1:00 this morning 3. BM CONSISTENCY: "How loose or watery is the diarrhea?"      loose 4. VOMITING: "Are you also vomiting?" If Yes, ask: "How many times in the past 24 hours?"      no 5. ABDOMEN PAIN: "Are you having any abdomen pain?" If Yes, ask: "What does it feel like?" (e.g., crampy, dull, intermittent, constant)      no 6. ABDOMEN PAIN SEVERITY: If present, ask: "How bad is the pain?"  (e.g., Scale 1-10; mild, moderate, or severe)   - MILD (1-3):  doesn't interfere with normal activities, abdomen soft and not tender to touch    - MODERATE (4-7): interferes with normal activities or awakens from sleep, abdomen tender to touch    - SEVERE (8-10): excruciating pain, doubled over, unable to do any normal activities       no 7. ORAL INTAKE: If vomiting, "Have you been able to drink liquids?" "How much liquids have you had in the past 24 hours?"     yes 8. HYDRATION: "Any signs of dehydration?" (e.g., dry mouth [not just dry lips], too weak to stand, dizziness, new weight loss) "When did you last urinate?"     no 9. EXPOSURE: "Have you traveled to a foreign country recently?" "Have you been exposed to anyone with diarrhea?" "Could you have eaten any food that was spoiled?"     no 10. ANTIBIOTIC USE: "Are you taking antibiotics now or have you taken antibiotics in the past 2 months?"       no 11. OTHER SYMPTOMS: "Do you have any other symptoms?" (e.g., fever, blood in stool)       no 12. PREGNANCY: "Is there any chance you are pregnant?" "When was your last menstrual period?"       no  Protocols used: [facility]

## 2024-03-09 NOTE — Telephone Encounter (Signed)
 Visit done

## 2024-03-09 NOTE — Progress Notes (Signed)
 Subjective:    Patient ID: Katie Becker, female    DOB: [birth year], 73 y.o.   MRN: [id number]  HPI Video virtual visit due to diarrhea Identification done Reviewed limitations and billing and she gave consent Participants--patient in her home and I am in my office  Known IBS---especially affected by greasy food Did have some greasy food 2-3 days ago---and has changed to more bland food Soft stool at first--then urgency and very loose stools at night--awake most of the night (4-5 times an hour) 3 this morning---colored water mostly No fever No abdominal pain Lots of gas yesterday No blood  Did have fresh hamburger--from neighbor's cow a few days ago  Current Outpatient Medications on File Prior to Visit  Medication Sig Dispense Refill   atorvastatin  (LIPITOR) 40 MG tablet TAKE 1 TABLET DAILY 90 tablet 3   Calcium  Carbonate-Vitamin D (CALCIUM  + D PO) Take 1 tablet by mouth 2 (two) times daily before a meal.      cetirizine (ZYRTEC) 10 MG tablet Take 10 mg by mouth daily as needed for allergies.     EPINEPHrine  0.3 mg/0.3 mL IJ SOAJ injection Inject 0.3 mg into the muscle as needed for anaphylaxis. 1 each 1   esomeprazole  (NEXIUM ) 40 MG capsule TAKE 1 CAPSULE DAILY. 90 capsule 3   Multiple Vitamins-Minerals (OCUVITE ADULT 50+ PO) Take by mouth.     Multiple Vitamins-Minerals (WOMENS MULTIVITAMIN PLUS PO) Take 1 tablet by mouth daily.      traMADol  (ULTRAM ) 50 MG tablet Take 1 tablet (50 mg total) by mouth daily as needed. 30 tablet 0   No current facility-administered medications on file prior to visit.    Allergies  Allergen Reactions   Penicillins Anaphylaxis   Covid-19 (Mrna) Vaccine Other (See Comments)    Local swelling and redness.   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   DIVERTICULOSIS, COLON 04/20/2007   GERD 04/20/2007   H/O hiatal hernia    Heart murmur    told by PCP- years ago-never had echo   HEPATOMEGALY, HX OF 12/21/2009   HYPERLIPIDEMIA 04/20/2007   HYPERTENSION 04/20/2007   LOW BACK PAIN 08/16/2008   MENOPAUSAL SYNDROME 04/20/2007   PEPTIC ULCER DISEASE 09/06/2010   Personal history of chemotherapy    2015   Personal history of radiation therapy    2015   Wears glasses     Past Surgical History:  Procedure Laterality Date   ABDOMINAL HYSTERECTOMY     BREAST LUMPECTOMY Left    10/07/13 & 10/25/13   BREAST LUMPECTOMY WITH NEEDLE LOCALIZATION Left 10/07/2013   Procedure: BREAST LUMPECTOMY WITH NEEDLE LOCALIZATION EXCISION OF BILATERAL AXILLARY SKIN TAGS ;  Surgeon: Quitman Bucy, MD;  Location: [facility];  Service: General;  Laterality: Left;   BREAST SURGERY     30 rounds radiation   CARPAL TUNNEL RELEASE Bilateral    CHOLECYSTECTOMY     COLONOSCOPY     RE-EXCISION OF BREAST LUMPECTOMY Left 10/25/2013   Procedure: RE-EXCISION OF BREAST LUMPECTOMY;  Surgeon: Quitman Bucy, MD;  Location: [facility];  Service:  General;  Laterality: Left;  clean wound class   TUBAL LIGATION      Family History  Problem Relation Age of Onset   Hypertension Mother    Hyperlipidemia Mother    Heart disease Mother        congestive heart failure   Congestive Heart Failure Mother    Cancer Father        hx of melanoma   Hypertension Sister    Skin cancer Sister 67       basal cell carcinoma   Hypertension Brother    Heart disease Maternal Grandmother 19   Breast cancer Maternal Aunt 23   Colon cancer Paternal Grandmother 43   Heart disease Maternal Aunt 42   Prostate cancer Cousin     Social History   Socioeconomic History   Marital status: Widowed    Spouse name: Not on file   Number of children: 2   Years of  education: Not on file   Highest education level: Associate degree: occupational, Scientist, product/process development, or vocational program  Occupational History    Employer: [employer]    Comment: Retired  Tobacco Use   Smoking status: Former    Current packs/day: 0.00    Types: Cigarettes    Quit date: 10/21/1978    Years since quitting: 45.4    Passive exposure: Never   Smokeless tobacco: Never  Substance and Sexual Activity   Alcohol use: No   Drug use: No   Sexual activity: Not Currently  Other Topics Concern   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   2 daughters      No living will   Daughters should be medical decision makers   Would accept resuscitation   Wouldn't want tube feeds if cognitively unaware   Social Drivers of Health   Financial Resource Strain: Low Risk  (02/12/2024)   Overall Financial Resource Strain (CARDIA)    Difficulty of Paying Living Expenses: Not hard at all  Food Insecurity: No Food Insecurity (02/12/2024)   Hunger Vital Sign    Worried About Running Out of Food in the Last Year: Never true    Ran Out of Food in the Last Year: Never true  Transportation Needs: No Transportation Needs (02/12/2024)   PRAPARE - Administrator, Civil Service (Medical): No    Lack of Transportation (Non-Medical): No  Physical Activity: Unknown (02/12/2024)   Exercise Vital Sign    Days of Exercise per Week: 0 days    Minutes of Exercise per Session: Not on file  Stress: No Stress Concern Present (02/12/2024)   [employer] of Occupational Health - Occupational Stress Questionnaire    Feeling of Stress : Not at all  Social Connections: Moderately Integrated (02/12/2024)   Social Connection and Isolation Panel [NHANES]    Frequency of Communication with Friends and Family: More than three times a week    Frequency of Social Gatherings with Friends and Family: More than three times a week    Attends Religious Services:  More than 4 times per year    Active Member of [employer] or Organizations: Yes    Attends Banker Meetings: Never    Marital Status: Widowed  Catering manager Violence: Not on file   Review of Systems No N/V Appetite is still fine Still has some LLQ pain---but does seem to be her abdominal fold    Objective:   Physical Exam Pulmonary:  Effort: Pulmonary effort is normal. No respiratory distress.  Neurological:     Mental Status: She is alert.            Assessment & Plan:

## 2024-03-09 NOTE — Assessment & Plan Note (Signed)
 Could be her IBS from dietary changes Might be self limited infection Advised against imodium---unless it is extremely frequent No worrisome symptoms Observe--keep up with fluids, etc Could try imodium tonight if not improved

## 2024-03-11 ENCOUNTER — Encounter: Payer: Self-pay | Admitting: Internal Medicine

## 2024-03-12 ENCOUNTER — Encounter: Payer: Self-pay | Admitting: Internal Medicine

## 2024-03-31 DIAGNOSIS — M25552 Pain in left hip: Secondary | ICD-10-CM | POA: Diagnosis not present

## 2024-03-31 DIAGNOSIS — M79642 Pain in left hand: Secondary | ICD-10-CM | POA: Diagnosis not present

## 2024-03-31 DIAGNOSIS — G8929 Other chronic pain: Secondary | ICD-10-CM | POA: Diagnosis not present

## 2024-04-06 ENCOUNTER — Encounter: Payer: Self-pay | Admitting: Internal Medicine

## 2024-04-06 ENCOUNTER — Ambulatory Visit (INDEPENDENT_AMBULATORY_CARE_PROVIDER_SITE_OTHER): Admitting: Internal Medicine

## 2024-04-06 VITALS — BP 124/84 | HR 76 | Temp 97.8°F | Ht 61.25 in | Wt 172.0 lb

## 2024-04-06 DIAGNOSIS — R5383 Other fatigue: Secondary | ICD-10-CM | POA: Diagnosis not present

## 2024-04-06 LAB — CBC
HCT: 43.9 % (ref 36.0–46.0)
Hemoglobin: 14.5 g/dL (ref 12.0–15.0)
MCHC: 33.1 g/dL (ref 30.0–36.0)
MCV: 85.6 fl (ref 78.0–100.0)
Platelets: 199 10*3/uL (ref 150.0–400.0)
RBC: 5.13 Mil/uL — ABNORMAL HIGH (ref 3.87–5.11)
RDW: 13.5 % (ref 11.5–15.5)
WBC: 5.9 10*3/uL (ref 4.0–10.5)

## 2024-04-06 LAB — COMPREHENSIVE METABOLIC PANEL WITH GFR
ALT: 33 U/L (ref 0–35)
AST: 29 U/L (ref 0–37)
Albumin: 4.5 g/dL (ref 3.5–5.2)
Alkaline Phosphatase: 81 U/L (ref 39–117)
BUN: 12 mg/dL (ref 6–23)
CO2: 28 meq/L (ref 19–32)
Calcium: 9.7 mg/dL (ref 8.4–10.5)
Chloride: 106 meq/L (ref 96–112)
Creatinine, Ser: 0.96 mg/dL (ref 0.40–1.20)
GFR: 59.68 mL/min — ABNORMAL LOW (ref >60.00)
Glucose, Bld: 116 mg/dL — ABNORMAL HIGH (ref 70–99)
Potassium: 4.5 meq/L (ref 3.5–5.1)
Sodium: 143 meq/L (ref 135–145)
Total Bilirubin: 0.6 mg/dL (ref 0.2–1.2)
Total Protein: 6.7 g/dL (ref 6.0–8.3)

## 2024-04-06 LAB — T4, FREE: Free T4: 0.64 ng/dL (ref 0.60–1.60)

## 2024-04-06 LAB — TSH: TSH: 1.63 u[IU]/mL (ref 0.35–5.50)

## 2024-04-06 NOTE — Assessment & Plan Note (Signed)
 Vague Discussed ---unlikely to be from thyroid  issues (benign nodules seen 2022) Will check labs

## 2024-04-06 NOTE — Progress Notes (Signed)
 Subjective:    Patient ID: Katie Becker, female    DOB: [birth year], 73 y.o.   MRN: [id number]  HPI Here due to concerns about her thyroid   Daughter's worried about her--they have hypothyroidism Has had fatigue Some trouble with recall/memory once that worried her Wonders if zyrtec could be involved  Tends to be hyperactive Now realizes she has dyslexia--and probably lead to learning disability when in school Has always tended to be hyperactive/high strung  Did have thyroid  nodules noted in 2022---but none reached biopsy criteria  Current Outpatient Medications on File Prior to Visit  Medication Sig Dispense Refill   atorvastatin  (LIPITOR) 40 MG tablet TAKE 1 TABLET DAILY 90 tablet 3   Calcium  Carbonate-Vitamin D (CALCIUM  + D PO) Take 1 tablet by mouth 2 (two) times daily before a meal.      cetirizine (ZYRTEC) 10 MG tablet Take 10 mg by mouth daily as needed for allergies.     EPINEPHrine  0.3 mg/0.3 mL IJ SOAJ injection Inject 0.3 mg into the muscle as needed for anaphylaxis. 1 each 1   esomeprazole  (NEXIUM ) 40 MG capsule TAKE 1 CAPSULE DAILY. 90 capsule 3   Multiple Vitamins-Minerals (OCUVITE ADULT 50+ PO) Take by mouth.     Multiple Vitamins-Minerals (WOMENS MULTIVITAMIN PLUS PO) Take 1 tablet by mouth daily.      traMADol  (ULTRAM ) 50 MG tablet Take 1 tablet (50 mg total) by mouth daily as needed. 30 tablet 0   No current facility-administered medications on file prior to visit.    Allergies  Allergen Reactions   Penicillins Anaphylaxis   Covid-19 (Mrna) Vaccine Other (See Comments)    Local swelling and redness.   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    DIVERTICULOSIS, COLON 04/20/2007   GERD 04/20/2007   H/O hiatal hernia    Heart murmur    told by PCP- years ago-never had echo   HEPATOMEGALY, HX OF 12/21/2009   HYPERLIPIDEMIA 04/20/2007   HYPERTENSION 04/20/2007   LOW BACK PAIN 08/16/2008   MENOPAUSAL SYNDROME 04/20/2007   PEPTIC ULCER DISEASE 09/06/2010   Personal history of chemotherapy    2015   Personal history of radiation therapy    2015   Wears glasses     Past Surgical History:  Procedure Laterality Date   ABDOMINAL HYSTERECTOMY     BREAST LUMPECTOMY Left    10/07/13 & 10/25/13   BREAST LUMPECTOMY WITH NEEDLE LOCALIZATION Left 10/07/2013   Procedure: BREAST LUMPECTOMY WITH NEEDLE LOCALIZATION EXCISION OF BILATERAL AXILLARY SKIN TAGS ;  Surgeon: Quitman Bucy, MD;  Location: [facility];  Service: General;  Laterality: Left;   BREAST SURGERY     30 rounds radiation   CARPAL TUNNEL RELEASE Bilateral    CHOLECYSTECTOMY     COLONOSCOPY     RE-EXCISION OF BREAST LUMPECTOMY Left 10/25/2013   Procedure: RE-EXCISION OF BREAST LUMPECTOMY;  Surgeon: Quitman Bucy, MD;  Location: [facility];  Service: General;  Laterality: Left;  clean wound class   TUBAL LIGATION      Family History  Problem Relation Age of Onset  Hypertension Mother    Hyperlipidemia Mother    Heart disease Mother        congestive heart failure   Congestive Heart Failure Mother    Cancer Father        hx of melanoma   Hypertension Sister    Skin cancer Sister 68       basal cell carcinoma   Hypertension Brother    Heart disease Maternal Grandmother 57   Breast cancer Maternal Aunt 72   Colon cancer Paternal Grandmother 42   Heart disease Maternal Aunt 42   Prostate cancer Cousin     Social History   Socioeconomic History   Marital status: Widowed    Spouse name: Not on file   Number of children: 2   Years of education: Not on file   Highest education level: Associate degree: academic program  Occupational History     Employer: [employer]    Comment: Retired  Tobacco Use   Smoking status: Former    Current packs/day: 0.00    Types: Cigarettes    Quit date: 10/21/1978    Years since quitting: 45.4    Passive exposure: Never   Smokeless tobacco: Never  Substance and Sexual Activity   Alcohol use: No   Drug use: No   Sexual activity: Not Currently  Other Topics Concern   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   2 daughters      No living will   Daughters should be medical decision makers   Would accept resuscitation   Wouldn't want tube feeds if cognitively unaware   Social Drivers of Health   Financial Resource Strain: Low Risk  (04/05/2024)   Overall Financial Resource Strain (CARDIA)    Difficulty of Paying Living Expenses: Not hard at all  Food Insecurity: No Food Insecurity (04/05/2024)   Hunger Vital Sign    Worried About Running Out of Food in the Last Year: Never true    Ran Out of Food in the Last Year: Never true  Transportation Needs: No Transportation Needs (04/05/2024)   PRAPARE - Administrator, Civil Service (Medical): No    Lack of Transportation (Non-Medical): No  Physical Activity: Inactive (04/05/2024)   Exercise Vital Sign    Days of Exercise per Week: 0 days    Minutes of Exercise per Session: Not on file  Stress: No Stress Concern Present (04/05/2024)   [employer] of Occupational Health - Occupational Stress Questionnaire    Feeling of Stress: Not at all  Social Connections: Moderately Integrated (04/05/2024)   Social Connection and Isolation Panel    Frequency of Communication with Friends and Family: More than three times a week    Frequency of Social Gatherings with Friends and Family: More than three times a week    Attends Religious Services: More than 4 times per year    Active Member of [employer] or Organizations: Yes    Attends Banker Meetings: More than 4 times per year     Marital Status: Widowed  Catering manager Violence: Not on file   Review of Systems Sleeps okay Did have spell of diarrhea--that is now better Arms feel tired at times Appetite is fine--tries to eat regularly Weight is stable     Objective:   Physical Exam HENT:     Mouth/Throat:     Pharynx: No oropharyngeal exudate or posterior oropharyngeal erythema.  Neck:  Comments: No thyroid  enlargement Cardiovascular:     Rate and Rhythm: Normal rate and regular rhythm.     Heart sounds: No murmur heard.    No gallop.  Pulmonary:     Effort: Pulmonary effort is normal.     Breath sounds: Normal breath sounds. No wheezing or rales.   Musculoskeletal:     Cervical back: Neck supple.     Right lower leg: No edema.     Left lower leg: No edema.  Lymphadenopathy:     Cervical: No cervical adenopathy.   Psychiatric:        Mood and Affect: Mood normal.        Behavior: Behavior normal.            Assessment & Plan:

## 2024-04-07 ENCOUNTER — Ambulatory Visit: Payer: Self-pay | Admitting: Internal Medicine

## 2024-05-06 DIAGNOSIS — M65332 Trigger finger, left middle finger: Secondary | ICD-10-CM | POA: Diagnosis not present

## 2024-06-15 DIAGNOSIS — M25512 Pain in left shoulder: Secondary | ICD-10-CM | POA: Diagnosis not present

## 2024-07-22 ENCOUNTER — Ambulatory Visit

## 2024-07-22 VITALS — BP 124/84 | Ht 61.5 in | Wt 168.0 lb

## 2024-07-22 DIAGNOSIS — Z0000 Encounter for general adult medical examination without abnormal findings: Secondary | ICD-10-CM

## 2024-07-22 NOTE — Progress Notes (Signed)
 Because this visit was a virtual/telehealth visit,  certain criteria was not obtained, such a blood pressure, CBG if applicable, and timed get up and go. Any medications not marked as taking were not mentioned during the medication reconciliation part of the visit. Any vitals not documented were not able to be obtained due to this being a telehealth visit or patient was unable to self-report a recent blood pressure reading due to a lack of equipment at home via telehealth. Vitals that have been documented are verbally provided by the patient.  This visit was performed by a medical professional under my direct supervision. I was immediately available for consultation/collaboration. I have reviewed and agree with the Annual Wellness Visit documentation.  Subjective:   Katie Becker is a 73 y.o. who presents for a Medicare Wellness preventive visit.  As a reminder, Annual Wellness Visits don't include a physical exam, and some assessments may be limited, especially if this visit is performed virtually. We may recommend an in-person follow-up visit with your provider if needed.  Visit Complete: Virtual I connected with  Katie Becker on 07/22/24 by a video and audio enabled telemedicine application and verified that I am speaking with the correct person using two identifiers.  Patient Location: Home  Provider Location: Home Office  I discussed the limitations of evaluation and management by telemedicine. The patient expressed understanding and agreed to proceed.  Vital Signs: Because this visit was a virtual/telehealth visit, some criteria may be missing or patient reported. Any vitals not documented were not able to be obtained and vitals that have been documented are patient reported.   Persons Participating in Visit: Patient.  AWV Questionnaire: Yes: Patient Medicare AWV questionnaire was completed by the patient on 07/22/2024; I have confirmed that all information answered by patient is  correct and no changes since this date.  Cardiac Risk Factors include: female gender;advanced age (>12men, >80 women);obesity (BMI >30kg/m2)     Objective:    Today's Vitals   07/22/24 1104  BP: 124/84  Weight: 168 lb (76.2 kg)  Height: 5' 1.5 (1.562 m)   Body mass index is 31.23 kg/m.     07/22/2024   11:03 AM 03/13/2021    8:19 PM 05/28/2017   11:05 AM 05/28/2016   10:41 AM 11/29/2015    9:59 AM 06/15/2015   10:52 AM 06/02/2015   10:25 AM  Advanced Directives  Does Patient Have a Medical Advance Directive? No No Yes  No  No  No  No   Would patient like information on creating a medical advance directive? No - Patient declined     No - patient declined information  No - patient declined information      Data saved with a previous flowsheet row definition    Current Medications (verified) Outpatient Encounter Medications as of 07/22/2024  Medication Sig   atorvastatin  (LIPITOR) 40 MG tablet TAKE 1 TABLET DAILY   Calcium  Carbonate-Vitamin D (CALCIUM  + D PO) Take 1 tablet by mouth 2 (two) times daily before a meal.    cetirizine (ZYRTEC) 10 MG tablet Take 10 mg by mouth daily as needed for allergies.   EPINEPHrine  0.3 mg/0.3 mL IJ SOAJ injection Inject 0.3 mg into the muscle as needed for anaphylaxis.   esomeprazole  (NEXIUM ) 40 MG capsule TAKE 1 CAPSULE DAILY.   Multiple Vitamins-Minerals (OCUVITE ADULT 50+ PO) Take by mouth.   Multiple Vitamins-Minerals (WOMENS MULTIVITAMIN PLUS PO) Take 1 tablet by mouth daily.    traMADol  (ULTRAM ) 50  MG tablet Take 1 tablet (50 mg total) by mouth daily as needed.   No facility-administered encounter medications on file as of 07/22/2024.    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    DIVERTICULOSIS, COLON 04/20/2007   GERD 04/20/2007   H/O hiatal hernia    Heart murmur    told by PCP- years ago-never had echo   HEPATOMEGALY, HX OF 12/21/2009   HYPERLIPIDEMIA 04/20/2007   HYPERTENSION 04/20/2007   LOW BACK PAIN 08/16/2008   MENOPAUSAL SYNDROME 04/20/2007   PEPTIC ULCER DISEASE 09/06/2010   Personal history of chemotherapy    2015   Personal history of radiation therapy    2015   Wears glasses    Past Surgical History:  Procedure Laterality Date   ABDOMINAL HYSTERECTOMY     BREAST LUMPECTOMY Left    10/07/13 & 10/25/13   BREAST LUMPECTOMY WITH NEEDLE LOCALIZATION Left 10/07/2013   Procedure: BREAST LUMPECTOMY WITH NEEDLE LOCALIZATION EXCISION OF BILATERAL AXILLARY SKIN TAGS ;  Surgeon: Morene ONEIDA Olives, MD;  Location: [facility] OR;  Service: General;  Laterality: Left;   BREAST SURGERY     30 rounds radiation   CARPAL TUNNEL RELEASE Bilateral    CHOLECYSTECTOMY     COLONOSCOPY     RE-EXCISION OF BREAST LUMPECTOMY Left 10/25/2013   Procedure: RE-EXCISION OF BREAST LUMPECTOMY;  Surgeon: Morene ONEIDA Olives, MD;  Location: [facility];  Service: General;  Laterality: Left;  clean wound class   TUBAL LIGATION     Family History  Problem Relation Age of Onset   Hypertension Mother    Hyperlipidemia Mother    Heart disease Mother        congestive heart failure   Congestive Heart Failure Mother    Cancer Father        hx of melanoma   Hypertension Sister    Skin cancer Sister 14       basal cell carcinoma   Hypertension Brother    Heart disease Maternal Grandmother 5   Breast cancer Maternal Aunt 60   Colon cancer Paternal Grandmother 27   Heart disease Maternal Aunt 42   Prostate cancer Cousin    Social History   Socioeconomic History   Marital status: Widowed    Spouse name: Not on file   Number of children: 2   Years of education: Not on file   Highest education level: Associate degree: academic program  Occupational History    Employer: [employer]    Comment: Retired  Tobacco Use   Smoking status: Former    Current packs/day: 0.00    Types: Cigarettes    Quit date: 10/21/1978    Years since quitting: 45.7    Passive exposure: Never   Smokeless tobacco: Never  Substance and Sexual Activity   Alcohol use: No   Drug use: No   Sexual activity: Not Currently  Other Topics Concern   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   2 daughters      No living will   Daughters should be medical decision makers   Would accept resuscitation   Wouldn't want tube feeds if cognitively unaware   Social Drivers of  Health   Financial Resource Strain: Low Risk  (07/22/2024)   Overall Financial Resource Strain (CARDIA)    Difficulty of Paying Living Expenses: Not hard at all  Food Insecurity: No Food Insecurity (07/22/2024)   Hunger Vital Sign    Worried About Running Out of Food in the Last Year: Never true    Ran Out of Food in the Last Year: Never true  Transportation Needs: No Transportation Needs (07/22/2024)   PRAPARE - Administrator, Civil Service (Medical): No    Lack of Transportation (Non-Medical): No  Physical Activity: Sufficiently Active (07/22/2024)   Exercise Vital Sign    Days of Exercise per Week: 7 days    Minutes of Exercise per Session: 30 min  Stress: No Stress Concern Present (07/22/2024)   [employer] of Occupational Health - Occupational Stress Questionnaire    Feeling of Stress: Not at all  Social Connections: Moderately Isolated (07/22/2024)   Social Connection and Isolation Panel    Frequency of Communication with Friends and Family: More than three times a week    Frequency of Social Gatherings with Friends and Family: More than three times a week    Attends Religious Services: More than 4 times per year    Active Member of [employer] or Organizations: No    Attends Banker Meetings: Never    Marital Status: Widowed     Tobacco Counseling Counseling given: Not Answered    Clinical Intake:  Pre-visit preparation completed: Yes  Pain : No/denies pain     BMI - recorded: 31.23 Nutritional Status: BMI > 30  Obese Nutritional Risks: None Diabetes: No  No results found for: HGBA1C   How often do you need to have someone help you when you read instructions, pamphlets, or other written materials from your doctor or pharmacy?: 1 - Never  Interpreter Needed?: No  Information entered by :: Reagen Goates,CMA   Activities of Daily Living     07/22/2024    3:21 AM  In your present state of health, do you have any difficulty performing the following activities:  Hearing? 0  Vision? 0  Difficulty concentrating or making decisions? 0  Walking or climbing stairs? 0  Dressing or bathing? 0  Doing errands, shopping? 0  Preparing Food and eating ? N  Using the Toilet? N  In the past six months, have you accidently leaked urine? N  Do you have problems with loss of bowel control? N  Managing your Medications? N  Managing your Finances? N  Housekeeping or managing your Housekeeping? N    Patient Care Team: Jimmy Charlie FERNS, MD as PCP - General (Internal Medicine)  I have updated your Care Teams any recent Medical Services you may have received from other providers in the past year.     Assessment:   This is a routine wellness examination for [facility].  Hearing/Vision screen Hearing Screening - Comments:: Patient has no trouble hearing  Vision Screening - Comments:: Patient wears glasses    Goals Addressed             This Visit's Progress    Patient Stated       To organize house       Depression Screen     07/22/2024   11:06 AM 03/09/2024    9:13 AM 02/12/2024   11:00 AM 07/28/2023    9:05 AM 07/28/2023    8:30 AM 06/25/2023    9:44 AM 07/23/2022   10:52 AM  PHQ 2/9 Scores  PHQ - 2 Score 0 0 0 1 0 0 0  PHQ- 9 Score 2    0 2     Fall Risk     07/22/2024    3:21 AM  03/09/2024    9:13 AM 02/12/2024   11:00 AM 07/28/2023    9:05 AM 07/28/2023    8:30 AM  Fall Risk   Falls in the past year? 0 0 0 0 0  Number falls in past yr: 0  0  0  Injury with Fall? 0  0  0  Risk for fall due to : No Fall Risks No Fall Risks No Fall Risks  No Fall Risks  Follow up Falls evaluation completed Falls evaluation completed Falls evaluation completed  Falls evaluation completed    MEDICARE RISK AT HOME:  Medicare Risk at Home Any stairs in or around the home?: (Patient-Rptd) Yes If so, are there any without handrails?: (Patient-Rptd) No Home free of loose throw rugs in walkways, pet beds, electrical cords, etc?: (Patient-Rptd) Yes Adequate lighting in your home to reduce risk of falls?: (Patient-Rptd) Yes Life alert?: (Patient-Rptd) No Use of a cane, walker or w/c?: (Patient-Rptd) No Grab bars in the bathroom?: (Patient-Rptd) Yes Shower chair or bench in shower?: (Patient-Rptd) No Elevated toilet seat or a handicapped toilet?: (Patient-Rptd) No  TIMED UP AND GO:  Was the test performed?  No  Cognitive Function: 6CIT completed        07/22/2024   11:07 AM  6CIT Screen  What Year? 0 points  What month? 0 points  What time? 0 points  Count back from 20 0 points  Months in reverse 0 points  Repeat phrase 0 points  Total Score 0 points    Immunizations Immunization History  Administered Date(s) Administered   Fluad Quad(high Dose 65+) 07/07/2019, 07/12/2020, 07/17/2021, 08/02/2022   Fluad Trivalent(High Dose 65+) 07/28/2023   Influenza Split 08/22/2011, 07/08/2012   Influenza Whole 08/21/2007, 08/16/2008, 07/18/2009, 08/10/2010   Influenza,inj,Quad PF,6+ Mos 08/09/2013, 08/10/2015, 07/21/2017   Influenza-Unspecified 09/02/2014, 07/24/2016, 08/02/2018   PFIZER(Purple Top)SARS-COV-2 Vaccination 01/17/2020, 02/07/2020   Pneumococcal Conjugate-13 10/20/2018   Pneumococcal Polysaccharide-23 07/07/2019   Td 02/08/2008, 06/02/2019   Tdap 01/20/2008, 06/02/2019     Screening Tests Health Maintenance  Topic Date Due   Influenza Vaccine  05/21/2024   Colonoscopy  06/14/2025   Medicare Annual Wellness (AWV)  07/22/2025   DTaP/Tdap/Td (5 - Td or Tdap) 06/01/2029   Pneumococcal Vaccine: 50+ Years  Completed   DEXA SCAN  Completed   Hepatitis C Screening  Completed   HPV VACCINES  Aged Out   Meningococcal B Vaccine  Aged Out   Mammogram  Discontinued   COVID-19 Vaccine  Discontinued   Zoster Vaccines- Shingrix  Discontinued    Health Maintenance Items Addressed:patient declined she will get them soon   Additional Screening:  Vision Screening: Recommended annual ophthalmology exams for early detection of glaucoma and other disorders of the eye. Is the patient up to date with their annual eye exam?  No  Who is the provider or what is the name of the office in which the patient attends annual eye exams?   Dental Screening: Recommended annual dental exams for proper oral hygiene  Community Resource Referral / Chronic Care Management: CRR required this visit?  No   CCM required this visit?  No   Plan:    I have personally reviewed and noted the following in the patient's chart:   Medical  and social history Use of alcohol, tobacco or illicit drugs  Current medications and supplements including opioid prescriptions. Patient is not currently taking opioid prescriptions. Functional ability and status Nutritional status Physical activity Advanced directives List of other physicians Hospitalizations, surgeries, and ER visits in previous 12 months Vitals Screenings to include cognitive, depression, and falls Referrals and appointments  In addition, I have reviewed and discussed with patient certain preventive protocols, quality metrics, and best practice recommendations. A written personalized care plan for preventive services as well as general preventive health recommendations were provided to patient.   Lyle MARLA Right,  [place]   07/22/2024   After Visit Summary: (MyChart) Due to this being a telephonic visit, the after visit summary with patients personalized plan was offered to patient via MyChart   Notes: Nothing significant to report at this time.

## 2024-07-22 NOTE — Patient Instructions (Signed)
 Ms. Oetken,  Thank you for taking the time for your Medicare Wellness Visit. I appreciate your continued commitment to your health goals. Please review the care plan we discussed, and feel free to reach out if I can assist you further.  Medicare recommends these wellness visits once per year to help you and your care team stay ahead of potential health issues. These visits are designed to focus on prevention, allowing your provider to concentrate on managing your acute and chronic conditions during your regular appointments.  Please note that Annual Wellness Visits do not include a physical exam. Some assessments may be limited, especially if the visit was conducted virtually. If needed, we may recommend a separate in-person follow-up with your provider.  Ongoing Care Seeing your primary care provider every 3 to 6 months helps us  monitor your health and provide consistent, personalized care.   Referrals If a referral was made during today's visit and you haven't received any updates within two weeks, please contact the referred provider directly to check on the status.  Recommended Screenings:  Health Maintenance  Topic Date Due   Flu Shot  05/21/2024   Colon Cancer Screening  06/14/2025   Medicare Annual Wellness Visit  07/22/2025   DTaP/Tdap/Td vaccine (5 - Td or Tdap) 06/01/2029   Pneumococcal Vaccine for age over 96  Completed   DEXA scan (bone density measurement)  Completed   Hepatitis C Screening  Completed   HPV Vaccine  Aged Out   Meningitis B Vaccine  Aged Out   Breast Cancer Screening  Discontinued   COVID-19 Vaccine  Discontinued   Zoster (Shingles) Vaccine  Discontinued       07/22/2024   11:03 AM  Advanced Directives  Does Patient Have a Medical Advance Directive? No  Would patient like information on creating a medical advance directive? No - Patient declined   Advance Care Planning is important because it: Ensures you receive medical care that aligns with your  values, goals, and preferences. Provides guidance to your family and loved ones, reducing the emotional burden of decision-making during critical moments.  Vision: Annual vision screenings are recommended for early detection of glaucoma, cataracts, and diabetic retinopathy. These exams can also reveal signs of chronic conditions such as diabetes and high blood pressure.  Dental: Annual dental screenings help detect early signs of oral cancer, gum disease, and other conditions linked to overall health, including heart disease and diabetes.  Please see the attached documents for additional preventive care recommendations.

## 2024-07-23 ENCOUNTER — Other Ambulatory Visit: Payer: Self-pay

## 2024-07-23 MED ORDER — ESOMEPRAZOLE MAGNESIUM 40 MG PO CPDR: 40.0000 mg | DELAYED_RELEASE_CAPSULE | Freq: Every day | ORAL | 0 refills | Status: DC

## 2024-07-28 ENCOUNTER — Ambulatory Visit (INDEPENDENT_AMBULATORY_CARE_PROVIDER_SITE_OTHER)

## 2024-07-28 VITALS — BP 134/82 | HR 65 | Temp 98.1°F | Ht 61.25 in | Wt 171.2 lb

## 2024-07-28 DIAGNOSIS — Z131 Encounter for screening for diabetes mellitus: Secondary | ICD-10-CM

## 2024-07-28 DIAGNOSIS — R944 Abnormal results of kidney function studies: Secondary | ICD-10-CM | POA: Diagnosis not present

## 2024-07-28 DIAGNOSIS — E782 Mixed hyperlipidemia: Secondary | ICD-10-CM | POA: Diagnosis not present

## 2024-07-28 DIAGNOSIS — K2100 Gastro-esophageal reflux disease with esophagitis, without bleeding: Secondary | ICD-10-CM | POA: Diagnosis not present

## 2024-07-28 DIAGNOSIS — Z23 Encounter for immunization: Secondary | ICD-10-CM | POA: Diagnosis not present

## 2024-07-28 DIAGNOSIS — E669 Obesity, unspecified: Secondary | ICD-10-CM | POA: Insufficient documentation

## 2024-07-28 MED ORDER — FAMOTIDINE 20 MG PO TABS
20.0000 mg | ORAL_TABLET | Freq: Two times a day (BID) | ORAL | 0 refills | Status: DC
Start: 2024-07-28 — End: 2024-08-25

## 2024-07-28 NOTE — Progress Notes (Signed)
 Subjective:    Patient ID: Katie Becker, female    DOB: [birth year], 73 y.o.   MRN: [id number]   Katie Becker is a very pleasant 73 y.o. female who presents today for follow-up of chronic conditions. Since she has been on Nexium  for years, when she does not take it feels a burning sensation in her stomach, reportedly has history of ulcers several years ago and does not want them to recur so she has been taking it daily. Denies NSAID use or any other GI symptoms at this time.  Review of Systems  All other systems reviewed and are negative.       Allergies  Allergen Reactions   Penicillins Anaphylaxis   Covid-19 (Mrna) Vaccine Other (See Comments)    Local swelling and redness.   Erythromycin Nausea And Vomiting   Adhesive [Tape]     rash   Amoxicillin     REACTION: unspecified   Shellfish Allergy     welts   Sulfamethoxazole Hives    REACTION: unspecified    Current Outpatient Medications on File Prior to Visit  Medication Sig Dispense Refill   atorvastatin  (LIPITOR) 40 MG tablet TAKE 1 TABLET DAILY 90 tablet 3   Calcium  Carbonate-Vitamin D (CALCIUM  + D PO) Take 1 tablet by mouth 2 (two) times daily before a meal.      cetirizine (ZYRTEC) 10 MG tablet Take 10 mg by mouth daily as needed for allergies.     EPINEPHrine  0.3 mg/0.3 mL IJ SOAJ injection Inject 0.3 mg into the muscle as needed for anaphylaxis. 1 each 1   esomeprazole  (NEXIUM ) 40 MG capsule Take 1 capsule (40 mg total) by mouth daily. 90 capsule 0   Multiple Vitamins-Minerals (OCUVITE ADULT 50+ PO) Take by mouth.     Multiple Vitamins-Minerals (WOMENS MULTIVITAMIN PLUS PO) Take 1 tablet by mouth daily.      traMADol  (ULTRAM ) 50 MG tablet Take 1 tablet (50 mg total) by mouth daily as needed. 30 tablet 0   No current facility-administered medications on file prior to visit.    BP 134/82   Pulse 65   Temp 98.1 F (36.7 C) (Oral)   Ht 5' 1.25 (1.556 m)   Wt 171 lb 3.2 oz (77.7 kg)   SpO2 96%   BMI 32.08  kg/m   Objective:    Physical Exam Vitals and nursing note reviewed.  Constitutional:      Appearance: Normal appearance.  HENT:     Head: Normocephalic and atraumatic.  Eyes:     Extraocular Movements: Extraocular movements intact.     Conjunctiva/sclera: Conjunctivae normal.  Skin:    General: Skin is warm.  Neurological:     Mental Status: She is alert.  Psychiatric:        Mood and Affect: Mood normal.        Behavior: Behavior normal.        Assessment & Plan:   1. Gastroesophageal reflux disease with esophagitis without hemorrhage (Primary) Per chart review, patient has been on Nexium  for several years, extensively counseled about the risk of long-term PPI use which includes but is not limited to osteopenia, infections, chronic kidney disease.  Counseled patient that in the absence of Barrett's esophagus, her history of ulcers would not be a medical indication for long-term, indefinite PPI therapy.  Through process of shared decision making, patient is agreeable to try H2 blocker, Pepcid as below for the next few weeks.  If symptoms are well-controlled at next  visit, would keep her on this dose for several weeks and consider tapering down at a later time. Will refrain from discontinuing Nexium  refills until confirm that patient has responded to Pepcid  - Discontinue Nexium  for now - Start Pepcid 20 mg 2 times daily  2. Diabetes mellitus screening 3. Elevated triglycerides with high cholesterol Per chart review, patient has never been screened for DM, screening does and at age 32, however, discussed with patient that at age 52 she is just outside of this window and should be screened given BMI of 32 and coexisting hyperlipidemia.  Per chart review, lipid panel from 1 year ago did show elevated triglycerides at 185, elevated from several years prior when they were in the 150s, will repeat lipid panel at this time, she will need treatment if triglycerides are greater than  400. In the meantime, we will continue treatment regimen as below.  - Hemoglobin A1c; Future - Lipid Panel; Future - Continue atorvastatin  40 mg daily  4. Decreased GFR Per chart review, CMP from a year ago did show mildly reduced GFR at 59, prior labs were normal, unclear whether this was acute injury versus initial stages of CKD, will repeat and determine next labs from there.  - Comprehensive metabolic panel with GFR; Future   5. Need for immunization against influenza Flu shot given this visit.  Return in about 3 weeks (around 08/18/2024) for CPE and GERD then pls schedule for fasting lab visit tomorrow.   Gentry Pilson K Monicka Cyran, MD  07/28/24

## 2024-07-28 NOTE — Patient Instructions (Addendum)
 Thank you for visiting [facility] today! Here's what we talked about: - Stop Nexium  and start Pepcid twice daily - Come back for fasting labs

## 2024-07-29 ENCOUNTER — Other Ambulatory Visit

## 2024-07-29 ENCOUNTER — Ambulatory Visit: Payer: Self-pay

## 2024-07-29 DIAGNOSIS — R944 Abnormal results of kidney function studies: Secondary | ICD-10-CM | POA: Diagnosis not present

## 2024-07-29 DIAGNOSIS — E782 Mixed hyperlipidemia: Secondary | ICD-10-CM | POA: Diagnosis not present

## 2024-07-29 DIAGNOSIS — Z131 Encounter for screening for diabetes mellitus: Secondary | ICD-10-CM

## 2024-07-29 DIAGNOSIS — N1831 Chronic kidney disease, stage 3a: Secondary | ICD-10-CM

## 2024-07-29 LAB — COMPREHENSIVE METABOLIC PANEL WITH GFR
ALT: 31 U/L (ref 0–35)
AST: 30 U/L (ref 0–37)
Albumin: 4.4 g/dL (ref 3.5–5.2)
Alkaline Phosphatase: 77 U/L (ref 39–117)
BUN: 12 mg/dL (ref 6–23)
CO2: 31 meq/L (ref 19–32)
Calcium: 9.3 mg/dL (ref 8.4–10.5)
Chloride: 103 meq/L (ref 96–112)
Creatinine, Ser: 1.1 mg/dL (ref 0.40–1.20)
GFR: 50.57 mL/min — ABNORMAL LOW (ref >60.00)
Glucose, Bld: 117 mg/dL — ABNORMAL HIGH (ref 70–99)
Potassium: 4.5 meq/L (ref 3.5–5.1)
Sodium: 142 meq/L (ref 135–145)
Total Bilirubin: 0.9 mg/dL (ref 0.2–1.2)
Total Protein: 6.5 g/dL (ref 6.0–8.3)

## 2024-07-29 LAB — LIPID PANEL
Cholesterol: 178 mg/dL (ref 0–200)
HDL: 49.8 mg/dL (ref >39.00)
LDL Cholesterol: 80 mg/dL (ref 0–99)
NonHDL: 128.37
Total CHOL/HDL Ratio: 4
Triglycerides: 240 mg/dL — ABNORMAL HIGH (ref 0.0–149.0)
VLDL: 48 mg/dL — ABNORMAL HIGH (ref 0.0–40.0)

## 2024-07-29 LAB — HEMOGLOBIN A1C: Hgb A1c MFr Bld: 6.6 % — ABNORMAL HIGH (ref 4.6–6.5)

## 2024-07-30 ENCOUNTER — Encounter: Payer: Self-pay | Admitting: *Deleted

## 2024-08-04 DIAGNOSIS — M25551 Pain in right hip: Secondary | ICD-10-CM | POA: Diagnosis not present

## 2024-08-24 ENCOUNTER — Other Ambulatory Visit: Payer: Self-pay

## 2024-08-24 NOTE — Telephone Encounter (Signed)
 Copied from CRM [id number]. Topic: Clinical - Medication Refill >> Aug 24, 2024  8:59 AM Rea ORN wrote: Medication:  atorvastatin  (LIPITOR) 40 MG tablet   Has the patient contacted their pharmacy? Yes (Agent: If no, request that the patient contact the pharmacy for the refill. If patient does not wish to contact the pharmacy document the reason why and proceed with request.) (Agent: If yes, when and what did the pharmacy advise?)  This is the patient's preferred pharmacy:  [facility] - [place], [place] - [address] [facility] to [facility] [address] [place] [address] Phone: [phone] Fax: [phone]  Is this the correct pharmacy for this prescription? Yes If no, delete pharmacy and type the correct one.   Has the prescription been filled recently? No  Is the patient out of the medication? Yes  Has the patient been seen for an appointment in the last year OR does the patient have an upcoming appointment? Yes  Can we respond through MyChart? No  Agent: Please be advised that Rx refills may take up to 3 business days. We ask that you follow-up with your pharmacy.

## 2024-08-25 ENCOUNTER — Ambulatory Visit (INDEPENDENT_AMBULATORY_CARE_PROVIDER_SITE_OTHER)

## 2024-08-25 VITALS — BP 130/80 | HR 74 | Temp 97.5°F | Ht 61.25 in | Wt 164.0 lb

## 2024-08-25 DIAGNOSIS — E118 Type 2 diabetes mellitus with unspecified complications: Secondary | ICD-10-CM

## 2024-08-25 DIAGNOSIS — R1013 Epigastric pain: Secondary | ICD-10-CM

## 2024-08-25 DIAGNOSIS — N1831 Chronic kidney disease, stage 3a: Secondary | ICD-10-CM

## 2024-08-25 DIAGNOSIS — Z7985 Long-term (current) use of injectable non-insulin antidiabetic drugs: Secondary | ICD-10-CM | POA: Diagnosis not present

## 2024-08-25 LAB — CBC WITH DIFFERENTIAL/PLATELET
Basophils Absolute: 0 K/uL (ref 0.0–0.1)
Basophils Relative: 0.7 % (ref 0.0–3.0)
Eosinophils Absolute: 0.2 K/uL (ref 0.0–0.7)
Eosinophils Relative: 2.7 % (ref 0.0–5.0)
HCT: 44.1 % (ref 36.0–46.0)
Hemoglobin: 14.8 g/dL (ref 12.0–15.0)
Lymphocytes Relative: 28 % (ref 12.0–46.0)
Lymphs Abs: 1.8 K/uL (ref 0.7–4.0)
MCHC: 33.6 g/dL (ref 30.0–36.0)
MCV: 87.3 fl (ref 78.0–100.0)
Monocytes Absolute: 0.4 K/uL (ref 0.1–1.0)
Monocytes Relative: 5.4 % (ref 3.0–12.0)
Neutro Abs: 4.2 K/uL (ref 1.4–7.7)
Neutrophils Relative %: 63.2 % (ref 43.0–77.0)
Platelets: 217 K/uL (ref 150.0–400.0)
RBC: 5.06 Mil/uL (ref 3.87–5.11)
RDW: 13.3 % (ref 11.5–15.5)
WBC: 6.6 K/uL (ref 4.0–10.5)

## 2024-08-25 LAB — MICROALBUMIN / CREATININE URINE RATIO
Creatinine,U: 32.4 mg/dL
Microalb Creat Ratio: UNDETERMINED mg/g (ref 0.0–30.0)
Microalb, Ur: <0.7 mg/dL

## 2024-08-25 LAB — VITAMIN D 25 HYDROXY (VIT D DEFICIENCY, FRACTURES): VITD: 56.42 ng/mL (ref 30.00–100.00)

## 2024-08-25 MED ORDER — LISINOPRIL 5 MG PO TABS: 5.0000 mg | ORAL_TABLET | Freq: Every day | ORAL | 3 refills | Status: AC

## 2024-08-25 MED ORDER — ATORVASTATIN CALCIUM 40 MG PO TABS: 40.0000 mg | ORAL_TABLET | Freq: Every day | ORAL | 3 refills | Status: AC

## 2024-08-25 MED ORDER — SUCRALFATE 1 G PO TABS: 1.0000 g | ORAL_TABLET | Freq: Three times a day (TID) | ORAL | 0 refills | Status: DC

## 2024-08-25 MED ORDER — EMPAGLIFLOZIN 10 MG PO TABS: 10.0000 mg | ORAL_TABLET | Freq: Every day | ORAL | 3 refills | Status: DC

## 2024-08-25 MED ORDER — OMEPRAZOLE 40 MG PO CPDR: 40.0000 mg | DELAYED_RELEASE_CAPSULE | Freq: Every day | ORAL | 0 refills | Status: AC

## 2024-08-25 NOTE — Progress Notes (Unsigned)
 Subjective:   This visit was conducted in person. The patient gave informed consent to the use of Abridge AI technology to record the contents of the encounter as documented below.   Patient ID: Katie Becker, female    DOB: 1951/03/09, 73 y.o.   MRN: [id number]   Discussed the use of AI scribe software for clinical note transcription with the patient, who gave verbal consent to proceed.  History of Present Illness Katie Becker is a 73 year old female with chronic kidney disease and peptic ulcer disease who presents with concerns about her kidney function and gastrointestinal symptoms.  She is experiencing concerns about her kidney function, noting abnormal levels of calcium , phosphorus, and potassium in her blood work. She has chronic kidney disease and is cautious with her diet, particularly sodium intake. She drinks 8 to 10 glasses of water daily to manage her gout.  She has a history of peptic ulcer disease, diagnosed when her daughter was 33 years old, and has been on medication since age 20. Her symptoms include a burning sensation in her stomach, described as 'pouring alcohol on a cut,' with intermittent but intense pain. She uses liquid antacids for relief. Previously, Nexium  was effective, but Pepcid has not been as effective. She is concerned about the impact of her medications on her kidneys. There is a family history of stomach issues, including irritable bowel syndrome.  She has type 2 diabetes with an A1c of 6.6 and is concerned about its impact on her kidney function. She is on cholesterol medication and is cautious about adding more medications. She is actively trying to lose weight, aiming for 135 to 140 pounds, and is currently losing about a pound a week. She has a family history of longevity, with her grandmother living to 71 and her mother to 109 despite health issues.  No black or red stools. No recent weight loss aside from intentional efforts. History of high blood  pressure episodes, including an episode three years ago related to pain from cartilage issues, but not currently on blood pressure medication. 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     Review of Systems  All other systems reviewed and are negative.       Allergies  Allergen Reactions   Penicillins Anaphylaxis   Covid-19 (Mrna) Vaccine Other (See Comments)    Local swelling and redness.   Erythromycin Nausea And Vomiting   Adhesive [Tape]     rash   Amoxicillin     REACTION: unspecified   Shellfish Allergy     welts   Sulfamethoxazole Hives    REACTION: unspecified    Current Outpatient Medications on File Prior to Visit  Medication Sig Dispense Refill   atorvastatin  (LIPITOR) 40 MG tablet Take 1 tablet (40 mg total) by mouth daily. 90 tablet 3   Calcium  Carbonate-Vitamin D (CALCIUM  + D PO) Take 1 tablet by mouth 2 (two) times daily before a meal.      cetirizine (ZYRTEC) 10 MG tablet Take 10 mg by mouth daily as needed for allergies.     EPINEPHrine  0.3 mg/0.3 mL IJ SOAJ injection Inject 0.3 mg into the muscle as needed for anaphylaxis. 1 each 1   Multiple Vitamins-Minerals (OCUVITE ADULT 50+ PO) Take by mouth.     Multiple Vitamins-Minerals (WOMENS MULTIVITAMIN PLUS PO) Take 1 tablet by mouth daily.      traMADol  (ULTRAM ) 50 MG tablet Take 1 tablet (50 mg  total) by mouth daily as needed. 30 tablet 0   No current facility-administered medications on file prior to visit.    BP 130/80 (BP Location: Left Arm, Patient Position: Sitting, Cuff Size: Large)   Pulse 74   Temp (!) 97.5 F (36.4 C) (Oral)   Ht 5' 1.25 (1.556 m)   Wt 164 lb (74.4 kg)   SpO2 97%   BMI 30.74 kg/m   Objective:      Physical Exam GENERAL: Alert, cooperative, well developed, no acute distress. Obese HEAD: Normocephalic atraumatic. ABDOMEN: Abdomen rounded, non-tender to palpation, non-distended, without organomegaly, normal bowel  sounds. EXTREMITIES: No cyanosis or edema. NEUROLOGICAL: Oriented to person, place and time, no gait abnormalities, moves all extremities without gross motor or sensory deficit.        Assessment & Plan:   Assessment & Plan Chronic kidney disease, stage 3a Type 2 diabetes mellitus A1c at 6.6 indicates well-controlled diabetes. Emphasized kidney-protective medications despite controlled diabetes. Addressed concerns about medication burden and side effects. BP within age-appropriate goal given coexisting CKD, in office reading at 130/80.  - Started lisinopril 5 mg daily for kidney protection. - Started Jardiance 10 mg daily for renal protection and diabetes management. - Ordered urine microalbumin test to assess kidney function. - Ordered blood tests for anemia, vitamin D, and parathyroid hormone levels. - Advised to avoid NSAIDs to prevent kidney damage. - Scheduled follow-up in four weeks to review kidney function and medication efficacy, she will need repeat BMP at that time.  Epigastric pain  Epigastric pain with history of peptic ulcer disease.  No red flag symptoms such as weight loss or bloody stools.  Differential includes PUD recurrence vs gastritis.  H. pylori is a possibility, discussed with patient about foregoing PPI treatment for the next 2 weeks in order to do testing, she does not think she would tolerate this.  Will treat empirically for PUD with daily PPI and sucralfate  for the next few weeks.  Once symptoms are improved, will discontinue PPI for 2 weeks in order to test for H. pylori. Discussed potential referral to GI for endoscopy if no improvement.   - Started omeprazole 40 mg daily for acid suppression. - Started sucralfate  1 gram three times daily for ulcer protection. - Plan to discontinue PPI in four weeks to test for H. pylori. - Scheduled follow-up in four weeks to assess symptom improvement and plan for H. pylori testing.      Return in about 4 weeks  (around 09/22/2024) for epigastric pain, revirew meds, repeat BMP for renal function.   Lamiracle Chaidez K Celese Banner, MD  08/25/24     Contains text generated by Abridge.

## 2024-08-25 NOTE — Patient Instructions (Addendum)
 Thank you for visiting  Healthcare today! Here's what we talked about: - Take omeprazole daily  - Take sucralfate  3 times daily for 14 days - We will test for H pylori in a few weeks - Start Jardiance and Lisinopril daily

## 2024-08-26 ENCOUNTER — Telehealth: Payer: Self-pay

## 2024-08-26 DIAGNOSIS — E785 Hyperlipidemia, unspecified: Secondary | ICD-10-CM | POA: Diagnosis not present

## 2024-08-26 DIAGNOSIS — I129 Hypertensive chronic kidney disease with stage 1 through stage 4 chronic kidney disease, or unspecified chronic kidney disease: Secondary | ICD-10-CM | POA: Diagnosis not present

## 2024-08-26 DIAGNOSIS — N1831 Chronic kidney disease, stage 3a: Secondary | ICD-10-CM | POA: Diagnosis not present

## 2024-08-26 LAB — PTH, INTACT AND CALCIUM
Calcium: 9.5 mg/dL (ref 8.6–10.4)
PTH: 28 pg/mL (ref 16–77)

## 2024-08-26 NOTE — Telephone Encounter (Signed)
 Copied from CRM [id number]. Topic: Appointments - Scheduling Inquiry for Clinic >> Aug 26, 2024  1:57 PM Sasha M wrote: Reason for CRM: Pt called in today because she wanted to schedule her 4 week follow up with Dr Bennett for her h pylori testing around 12/4 as intructed but there are no appts available. Also pt said when she checked out yesterday, the girl told her she was scheduling her for an office visit on 12/08 at 1040am but I do not see this appt scheduled. Please call pt at [phone] to clarify and advise pt

## 2024-08-27 NOTE — Telephone Encounter (Signed)
 Spoke to pt after talking with Dr Bennett. December 8th is just fine for the appointment. She is to continue the omeprazole until that visit. The sucralfate  will run out before that as planned.

## 2024-08-28 ENCOUNTER — Ambulatory Visit: Payer: Self-pay

## 2024-08-30 NOTE — Telephone Encounter (Signed)
 Labs sent to Dr Melia

## 2024-08-31 ENCOUNTER — Ambulatory Visit (INDEPENDENT_AMBULATORY_CARE_PROVIDER_SITE_OTHER)

## 2024-08-31 VITALS — BP 138/84 | HR 72 | Temp 97.6°F | Ht 61.25 in | Wt 165.0 lb

## 2024-08-31 DIAGNOSIS — E1122 Type 2 diabetes mellitus with diabetic chronic kidney disease: Secondary | ICD-10-CM

## 2024-08-31 DIAGNOSIS — Z780 Asymptomatic menopausal state: Secondary | ICD-10-CM

## 2024-08-31 DIAGNOSIS — N1831 Chronic kidney disease, stage 3a: Secondary | ICD-10-CM | POA: Diagnosis not present

## 2024-08-31 NOTE — Progress Notes (Signed)
 Assessment & Plan:   This visit was conducted in person. The patient gave informed consent to the use of Abridge AI technology to record the contents of the encounter as documented below.  Assessment & Plan Annual physical I have personally reviewed and have noted: 1. The patient's medical and social history 2. Their use of alcohol, tobacco or illicit drugs 3. Their current medications and supplements 4. The patient's functional ability including ADL's, fall risks, home safety risks and hearing or visual impairment. 5. Diet and physical activities 6. Evidence for depression or mood disorder  Colonoscopy: Due in 2026 Mammogram: 11/06/2023 was WNL DEXA: Done in 2019, WNL, ordered Pap: Aged out, last one in 2019 WNL LDCT:Not indicated, quit smoking in 1989 Labs:Hep C, Lipid and A1c UTD Immunizations: Declines covid and shingles Diet and Exercise: Cut back on sugar and processed food, walking twice weekly Sleep and mood: No concerns Dental and vision:UTD  ADLs and IADLs: Capable Cognitive: Intact to orientation, naming, recall and repetition Fall risk and home safety: No concerns  Chronic kidney disease, stage 3a Stage 3a CKD well-managed with Lisinopril.  No SGLT2 per nephro.  - Continue lisinopril 5 mg daily  Type 2 diabetes mellitus Managed with diet, well-controlled. - Continue dietary modifications. - Encouraged regular physical activity, at least three times a week   Follow-up: Return in about 27 days (around 09/27/2024) for Epigastric pain.        Subjective:   Patient ID: Katie Becker, female    DOB: Jun 25, 1951  Age: 73 y.o. MRN: [id number]  Patient Care Team: Bennett Reuben POUR, MD as PCP - General (Family Medicine)   CC:  Chief Complaint  Patient presents with   Annual Exam    Did not start the Jardiance. Wants to make sure she understands when and how long she takes the sucralfate  and omeprazole. Thinks the acid is more stress than food.       Katie Becker is a 73 y.o. female who presents today for a complete physical exam.   Discussed the use of AI scribe software for clinical note transcription with the patient, who gave verbal consent to proceed.  History of Present Illness Katie Becker is a 73 year old female with a history of ulcers who presents for medication management and follow-up.  She is currently on a treatment regimen for ulcers, which includes omeprazole and sucralfate . She initially experienced confusion regarding the medication schedule. She has been experiencing 'burning belly' sensations and is concerned about potential ulcer complications if untreated. Stress exacerbates her symptoms.  She has a history of bleeding ulcers and is cautious about her diet, avoiding foods that may irritate her stomach. She manages her condition by avoiding antacids like Rolaids and Mylanta, opting instead for Tums if necessary. She is mindful of her stress levels, which she acknowledges can impact her ulcer symptoms.  Her past medical history includes kidney issues, which she manages by improving her diet and reducing intake of irritating foods. She has a history of smoking, having quit in 1989 after 23 years. She is also managing diabetes by avoiding processed foods, reducing sodium intake, and losing weight gradually. She has been experiencing fatigue, which has improved since making dietary changes.  She engages in physical activity by walking on a treadmill at least twice a week and plans to increase this to three times a week. She has a history of gout and arthritis, which she manages by staying hydrated and reducing sugar intake  to prevent inflammation.  Her family history includes dementia on one side, and she is aware of the importance of maintaining habits to manage her health. She has a supportive family, including daughters who live nearby, although she notes that this can sometimes be stressful.  She reports no current smoking and  does not use a walker or cane. She is able to perform activities of daily living independently. She reports normal bowel movements and urination. She reports that her mood is generally high energy and she usually goes with the flow. She reports no recent falls and is able to perform daily activities independently.   Depression Screening;    08/31/2024   12:22 PM 08/25/2024   10:40 AM 07/22/2024   11:06 AM 03/09/2024    9:13 AM 02/12/2024   11:00 AM 07/28/2023    9:05 AM 07/28/2023    8:30 AM  PHQ 2/9 Scores  PHQ - 2 Score 0 0 0 0 0 1 0  PHQ- 9 Score   2     0      Data saved with a previous flowsheet row definition     Anxiety Screening:    06/25/2023    9:44 AM  GAD 7 : Generalized Anxiety Score  Nervous, Anxious, on Edge 0  Control/stop worrying 0  Worry too much - different things 0  Trouble relaxing 0  Restless 0  Easily annoyed or irritable 0  Afraid - awful might happen 0  Total GAD 7 Score 0  Anxiety Difficulty Not difficult at all     ROS: Negative unless specifically indicated above in HPI.       Objective:     BP 138/84 (BP Location: Left Arm, Patient Position: Sitting, Cuff Size: Large)   Pulse 72   Temp 97.6 F (36.4 C) (Oral)   Ht 5' 1.25 (1.556 m)   Wt 165 lb (74.8 kg)   SpO2 95%   BMI 30.92 kg/m    Physical Exam GENERAL: Alert, cooperative, well developed, no acute distress. HEAD: Normocephalic atraumatic. EYES: Extraocular movements intact BL, pupils round, equal and reactive to light BL, conjunctivae normal BL. EARS: Tympanic membrane, ear canal and external ear normal BL. NOSE: No congestion or rhinorrhea, mucous membranes are moist. THROAT: No oropharyngeal exudate or posterior oropharyngeal erythema. CARDIOVASCULAR: Normal heart rate and rhythm, S1 and S2 normal without murmurs. CHEST: Clear to auscultation bilaterally, no wheezes, rhonchi, or crackles. ABDOMEN: Normal on inspection, soft, non tender, non distended, without organomegaly,  normal bowel sounds. EXTREMITIES: No cyanosis or edema. NEUROLOGICAL: Oriented to person, place and time, cranial nerves grossly intact, no gait abnormalities, moves all extremities without gross motor or sensory deficit. NECK: Thyroid  non-tender on palpation.        Jeselle Hiser K Yunuen Mordan, MD  08/31/24    Contains text generated by [employer].

## 2024-08-31 NOTE — Patient Instructions (Addendum)
 Thank you for visiting [facility] today! Here's what we talked about: - START Omeprazole and Sucralfate  today.  - STOP Omeprazole on Nov 24th, can refill sucralfate  around that time and continue on that alone until the test - Get shingles vaccine from pharmacy

## 2024-09-10 ENCOUNTER — Other Ambulatory Visit: Payer: Self-pay

## 2024-09-10 DIAGNOSIS — R1013 Epigastric pain: Secondary | ICD-10-CM

## 2024-09-21 DIAGNOSIS — H2513 Age-related nuclear cataract, bilateral: Secondary | ICD-10-CM | POA: Diagnosis not present

## 2024-09-21 DIAGNOSIS — H04123 Dry eye syndrome of bilateral lacrimal glands: Secondary | ICD-10-CM | POA: Diagnosis not present

## 2024-09-22 ENCOUNTER — Other Ambulatory Visit: Payer: Self-pay

## 2024-09-22 DIAGNOSIS — Z1231 Encounter for screening mammogram for malignant neoplasm of breast: Secondary | ICD-10-CM

## 2024-09-27 ENCOUNTER — Ambulatory Visit (INDEPENDENT_AMBULATORY_CARE_PROVIDER_SITE_OTHER)

## 2024-09-27 VITALS — BP 114/70 | HR 63 | Temp 98.0°F | Ht 61.25 in | Wt 162.0 lb

## 2024-09-27 DIAGNOSIS — R1013 Epigastric pain: Secondary | ICD-10-CM

## 2024-09-27 NOTE — Progress Notes (Signed)
 Subjective:   This visit was conducted in person. The patient gave informed consent to the use of Abridge AI technology to record the contents of the encounter as documented below.   Patient ID: Katie Becker, female    DOB: Mar 23, 1951, 73 y.o.   MRN: [id number]   Discussed the use of AI scribe software for clinical note transcription with the patient, who gave verbal consent to proceed.  History of Present Illness Katie Becker is a 73 year old female who presents with abdominal pain and H. pylori testing.  Her stomach pain has improved with the use of sucralfate , which she has been taking with one day of medication remaining. She previously underwent a swab test for H. pylori many years ago. She experiences chronic stomach problems and bloating, which she attributes to stomach acid rather than bacterial infection. She denies nausea or vomiting.  She has adjusted her diet due to concerns about kidney issues and diabetes, resulting in weight loss from 162 to 159 pounds. Her goal weight is 140 pounds, and she is cautious about developing an eating disorder. She has made dietary changes, avoiding fried foods and eating in moderation. She has used Maalox and Mylanta for occasional burning sensations and keeps a backup bottle of Maalox for anti-gas relief. She drinks plenty of water and is mindful of her medication intake, preferring to minimize it.  She experienced a scare with a small amount of clear gel and a tiny red spot in her stool, which she attributes to a possible diverticulitis flare-up and constipation. She has a family history of colon cancer. No continuous blood in stools, no black or tarry stools currently.  She has a history of allergies to penicillin, sulfa drugs, and myosin, which cause adverse reactions. She carries an EpiPen  for emergencies.   Review of Systems  All other systems reviewed and are negative.       Allergies  Allergen Reactions   Penicillins Anaphylaxis    Covid-19 (Mrna) Vaccine Other (See Comments)    Local swelling and redness.   Erythromycin Nausea And Vomiting   Adhesive [Tape]     rash   Amoxicillin     REACTION: unspecified   Shellfish Allergy     welts   Sulfamethoxazole Hives    REACTION: unspecified    Current Outpatient Medications on File Prior to Visit  Medication Sig Dispense Refill   atorvastatin  (LIPITOR) 40 MG tablet Take 1 tablet (40 mg total) by mouth daily. 90 tablet 3   Calcium  Carbonate-Vitamin D  (CALCIUM  + D PO) Take 1 tablet by mouth 2 (two) times daily before a meal.      cetirizine (ZYRTEC) 10 MG tablet Take 10 mg by mouth daily as needed for allergies.     EPINEPHrine  0.3 mg/0.3 mL IJ SOAJ injection Inject 0.3 mg into the muscle as needed for anaphylaxis. 1 each 1   lisinopril  (ZESTRIL ) 5 MG tablet Take 1 tablet (5 mg total) by mouth daily. 90 tablet 3   sucralfate  (CARAFATE ) 1 g tablet Take 1 tablet (1 g total) by mouth 4 (four) times daily -  with meals and at bedtime for 14 days. 56 tablet 0   traMADol  (ULTRAM ) 50 MG tablet Take 1 tablet (50 mg total) by mouth daily as needed. 30 tablet 0   omeprazole  (PRILOSEC) 40 MG capsule Take 1 capsule (40 mg total) by mouth daily. (Patient not taking: Reported on 09/27/2024) 90 capsule 0   No current facility-administered medications on file  prior to visit.    BP 114/70 (BP Location: Left Arm, Patient Position: Sitting, Cuff Size: Large)   Pulse 63   Temp 98 F (36.7 C) (Oral)   Ht 5' 1.25 (1.556 m)   Wt 162 lb (73.5 kg)   SpO2 97%   BMI 30.36 kg/m   Objective:      Physical Exam MEASUREMENTS: Weight- 159. GENERAL: Alert, cooperative, well developed, no acute distress. HEAD: Normocephalic atraumatic. EYES: Extraocular movements intact bilaterally, pupils round, equal and reactive to light bilaterally, conjunctivae normal bilaterally. ABDOMEN: Soft, non-tender, non-distended, without organomegaly, normal bowel sounds, no rebound, no guarding. EXTREMITIES:  No cyanosis or edema. NEUROLOGICAL: Oriented to person, place and time, no gait abnormalities, moves all extremities without gross motor or sensory deficit.      Assessment & Plan:   Assessment & Plan Epigastric pain Suspected PUD with now improved abdominal pain, she is mostly asymptomatic. Occasional bloating and gas. No melena or other red flag sx. Intentional weight loss due to dietary changes.  - Finish sucralfate  course. - Ordered H. pylori breath test. - If H. pylori positive, initiate antibiotics and antacid therapy, considering penicillin allergy. - If H. pylori negative, continue omeprazole  for 8 weeks, then dc and only use omeprazole  as needed. - Monitor for symptom flare-up or pain return.    Return in about 11 months (around 08/31/2025) for CPE, fasting labs 1wk prior.   Marieann Zipp K Elza Sortor, MD  09/27/24     Contains text generated by Abridge.

## 2024-09-27 NOTE — Patient Instructions (Signed)
 Thank you for visiting [facility] today! Here's what we talked about: - Continue sucralfate , I will be in touch about treatment plan once we have results

## 2024-09-28 LAB — H. PYLORI BREATH TEST: H. pylori Breath Test: NOT DETECTED

## 2024-09-29 ENCOUNTER — Ambulatory Visit: Payer: Self-pay

## 2024-10-18 ENCOUNTER — Other Ambulatory Visit: Payer: Self-pay

## 2024-10-25 ENCOUNTER — Encounter: Payer: Self-pay | Admitting: Internal Medicine

## 2024-11-04 ENCOUNTER — Other Ambulatory Visit: Payer: Self-pay | Admitting: Nephrology

## 2024-11-04 DIAGNOSIS — N1831 Chronic kidney disease, stage 3a: Secondary | ICD-10-CM

## 2024-11-09 ENCOUNTER — Ambulatory Visit: Admission: RE | Admit: 2024-11-09 | Discharge: 2024-11-09 | Disposition: A | Source: Ambulatory Visit

## 2024-11-09 DIAGNOSIS — Z1231 Encounter for screening mammogram for malignant neoplasm of breast: Secondary | ICD-10-CM

## 2024-11-12 ENCOUNTER — Ambulatory Visit: Admission: RE | Admit: 2024-11-12 | Source: Ambulatory Visit

## 2024-11-12 DIAGNOSIS — N1831 Chronic kidney disease, stage 3a: Secondary | ICD-10-CM

## 2024-11-19 ENCOUNTER — Ambulatory Visit: Payer: Self-pay

## 2024-11-19 ENCOUNTER — Other Ambulatory Visit: Payer: Self-pay

## 2024-11-19 DIAGNOSIS — R928 Other abnormal and inconclusive findings on diagnostic imaging of breast: Secondary | ICD-10-CM

## 2024-11-23 ENCOUNTER — Ambulatory Visit: Admission: RE | Admit: 2024-11-23 | Discharge: 2024-11-23 | Disposition: A | Source: Ambulatory Visit

## 2024-11-23 ENCOUNTER — Other Ambulatory Visit: Payer: Self-pay

## 2024-11-23 DIAGNOSIS — R928 Other abnormal and inconclusive findings on diagnostic imaging of breast: Secondary | ICD-10-CM

## 2024-11-23 DIAGNOSIS — N6322 Unspecified lump in the left breast, upper inner quadrant: Secondary | ICD-10-CM

## 2024-11-24 LAB — SURGICAL PATHOLOGY

## 2024-11-26 ENCOUNTER — Other Ambulatory Visit

## 2024-11-26 ENCOUNTER — Encounter

## 2024-12-22 ENCOUNTER — Other Ambulatory Visit

## 2024-12-22 ENCOUNTER — Encounter

## 2024-12-24 ENCOUNTER — Ambulatory Visit: Admitting: Internal Medicine

## 2025-06-21 ENCOUNTER — Other Ambulatory Visit (HOSPITAL_BASED_OUTPATIENT_CLINIC_OR_DEPARTMENT_OTHER)

## 2025-08-09 ENCOUNTER — Ambulatory Visit
